# Patient Record
Sex: Female | Born: 1935 | Race: White | Hispanic: Yes | Marital: Married | Smoking: Never smoker
Health system: Southern US, Community
[De-identification: ages and names within clinical notes are randomized; demographics above are authoritative.]

## PROBLEM LIST (undated history)

## (undated) DIAGNOSIS — I10 Essential (primary) hypertension: Secondary | ICD-10-CM

## (undated) DIAGNOSIS — E785 Hyperlipidemia, unspecified: Secondary | ICD-10-CM

## (undated) DIAGNOSIS — K5792 Diverticulitis of intestine, part unspecified, without perforation or abscess without bleeding: Secondary | ICD-10-CM

## (undated) DIAGNOSIS — E119 Type 2 diabetes mellitus without complications: Secondary | ICD-10-CM

## (undated) HISTORY — PX: HERNIA REPAIR: SHX51

## (undated) HISTORY — PX: ABDOMINAL HYSTERECTOMY: SHX81

## (undated) HISTORY — PX: COLON SURGERY: SHX602

## (undated) HISTORY — PX: CHOLECYSTECTOMY: SHX55

---

## 2013-12-10 DIAGNOSIS — K56609 Unspecified intestinal obstruction, unspecified as to partial versus complete obstruction: Secondary | ICD-10-CM | POA: Diagnosis not present

## 2013-12-30 DIAGNOSIS — M7500 Adhesive capsulitis of unspecified shoulder: Secondary | ICD-10-CM | POA: Diagnosis not present

## 2013-12-30 DIAGNOSIS — M7530 Calcific tendinitis of unspecified shoulder: Secondary | ICD-10-CM | POA: Diagnosis not present

## 2013-12-31 DIAGNOSIS — I10 Essential (primary) hypertension: Secondary | ICD-10-CM | POA: Diagnosis not present

## 2013-12-31 DIAGNOSIS — E782 Mixed hyperlipidemia: Secondary | ICD-10-CM | POA: Diagnosis not present

## 2013-12-31 DIAGNOSIS — M25519 Pain in unspecified shoulder: Secondary | ICD-10-CM | POA: Diagnosis not present

## 2013-12-31 DIAGNOSIS — IMO0001 Reserved for inherently not codable concepts without codable children: Secondary | ICD-10-CM | POA: Diagnosis not present

## 2014-01-24 DIAGNOSIS — M25519 Pain in unspecified shoulder: Secondary | ICD-10-CM | POA: Diagnosis not present

## 2014-01-26 DIAGNOSIS — IMO0001 Reserved for inherently not codable concepts without codable children: Secondary | ICD-10-CM | POA: Diagnosis not present

## 2014-01-26 DIAGNOSIS — E782 Mixed hyperlipidemia: Secondary | ICD-10-CM | POA: Diagnosis not present

## 2014-01-26 DIAGNOSIS — M899 Disorder of bone, unspecified: Secondary | ICD-10-CM | POA: Diagnosis not present

## 2014-01-26 DIAGNOSIS — E559 Vitamin D deficiency, unspecified: Secondary | ICD-10-CM | POA: Diagnosis not present

## 2014-01-26 DIAGNOSIS — M2550 Pain in unspecified joint: Secondary | ICD-10-CM | POA: Diagnosis not present

## 2014-01-26 DIAGNOSIS — N183 Chronic kidney disease, stage 3 unspecified: Secondary | ICD-10-CM | POA: Diagnosis not present

## 2014-01-26 DIAGNOSIS — I10 Essential (primary) hypertension: Secondary | ICD-10-CM | POA: Diagnosis not present

## 2014-02-04 DIAGNOSIS — N6019 Diffuse cystic mastopathy of unspecified breast: Secondary | ICD-10-CM | POA: Diagnosis not present

## 2014-02-04 DIAGNOSIS — Z1231 Encounter for screening mammogram for malignant neoplasm of breast: Secondary | ICD-10-CM | POA: Diagnosis not present

## 2014-02-04 DIAGNOSIS — N63 Unspecified lump in unspecified breast: Secondary | ICD-10-CM | POA: Diagnosis not present

## 2014-02-07 DIAGNOSIS — E1169 Type 2 diabetes mellitus with other specified complication: Secondary | ICD-10-CM | POA: Diagnosis not present

## 2014-02-07 DIAGNOSIS — E782 Mixed hyperlipidemia: Secondary | ICD-10-CM | POA: Diagnosis not present

## 2014-02-07 DIAGNOSIS — M19019 Primary osteoarthritis, unspecified shoulder: Secondary | ICD-10-CM | POA: Diagnosis not present

## 2014-02-07 DIAGNOSIS — I10 Essential (primary) hypertension: Secondary | ICD-10-CM | POA: Diagnosis not present

## 2014-02-15 DIAGNOSIS — I259 Chronic ischemic heart disease, unspecified: Secondary | ICD-10-CM | POA: Diagnosis not present

## 2014-02-23 DIAGNOSIS — N6019 Diffuse cystic mastopathy of unspecified breast: Secondary | ICD-10-CM | POA: Diagnosis not present

## 2014-02-28 DIAGNOSIS — E1169 Type 2 diabetes mellitus with other specified complication: Secondary | ICD-10-CM | POA: Diagnosis not present

## 2014-02-28 DIAGNOSIS — S43429A Sprain of unspecified rotator cuff capsule, initial encounter: Secondary | ICD-10-CM | POA: Diagnosis not present

## 2014-02-28 DIAGNOSIS — E782 Mixed hyperlipidemia: Secondary | ICD-10-CM | POA: Diagnosis not present

## 2014-02-28 DIAGNOSIS — I10 Essential (primary) hypertension: Secondary | ICD-10-CM | POA: Diagnosis not present

## 2014-02-28 DIAGNOSIS — M19019 Primary osteoarthritis, unspecified shoulder: Secondary | ICD-10-CM | POA: Diagnosis not present

## 2014-03-24 DIAGNOSIS — I10 Essential (primary) hypertension: Secondary | ICD-10-CM | POA: Diagnosis not present

## 2014-03-24 DIAGNOSIS — M2550 Pain in unspecified joint: Secondary | ICD-10-CM | POA: Diagnosis not present

## 2014-03-24 DIAGNOSIS — E782 Mixed hyperlipidemia: Secondary | ICD-10-CM | POA: Diagnosis not present

## 2014-03-24 DIAGNOSIS — E559 Vitamin D deficiency, unspecified: Secondary | ICD-10-CM | POA: Diagnosis not present

## 2014-03-24 DIAGNOSIS — IMO0001 Reserved for inherently not codable concepts without codable children: Secondary | ICD-10-CM | POA: Diagnosis not present

## 2014-03-24 DIAGNOSIS — N183 Chronic kidney disease, stage 3 unspecified: Secondary | ICD-10-CM | POA: Diagnosis not present

## 2014-03-24 DIAGNOSIS — M899 Disorder of bone, unspecified: Secondary | ICD-10-CM | POA: Diagnosis not present

## 2014-03-27 DIAGNOSIS — Z880 Allergy status to penicillin: Secondary | ICD-10-CM | POA: Diagnosis not present

## 2014-03-27 DIAGNOSIS — I10 Essential (primary) hypertension: Secondary | ICD-10-CM | POA: Diagnosis not present

## 2014-03-27 DIAGNOSIS — R6889 Other general symptoms and signs: Secondary | ICD-10-CM | POA: Diagnosis not present

## 2014-03-27 DIAGNOSIS — I4510 Unspecified right bundle-branch block: Secondary | ICD-10-CM | POA: Diagnosis not present

## 2014-03-27 DIAGNOSIS — E162 Hypoglycemia, unspecified: Secondary | ICD-10-CM | POA: Diagnosis not present

## 2014-03-27 DIAGNOSIS — E119 Type 2 diabetes mellitus without complications: Secondary | ICD-10-CM | POA: Diagnosis not present

## 2014-03-27 DIAGNOSIS — R079 Chest pain, unspecified: Secondary | ICD-10-CM | POA: Diagnosis not present

## 2014-03-27 DIAGNOSIS — R5383 Other fatigue: Secondary | ICD-10-CM | POA: Diagnosis not present

## 2014-03-27 DIAGNOSIS — R5381 Other malaise: Secondary | ICD-10-CM | POA: Diagnosis not present

## 2014-03-27 DIAGNOSIS — J45909 Unspecified asthma, uncomplicated: Secondary | ICD-10-CM | POA: Diagnosis not present

## 2014-03-27 DIAGNOSIS — R030 Elevated blood-pressure reading, without diagnosis of hypertension: Secondary | ICD-10-CM | POA: Diagnosis not present

## 2014-04-01 DIAGNOSIS — K2990 Gastroduodenitis, unspecified, without bleeding: Secondary | ICD-10-CM | POA: Diagnosis not present

## 2014-04-01 DIAGNOSIS — E7800 Pure hypercholesterolemia, unspecified: Secondary | ICD-10-CM | POA: Diagnosis not present

## 2014-04-01 DIAGNOSIS — E119 Type 2 diabetes mellitus without complications: Secondary | ICD-10-CM | POA: Diagnosis not present

## 2014-04-01 DIAGNOSIS — Z880 Allergy status to penicillin: Secondary | ICD-10-CM | POA: Diagnosis not present

## 2014-04-01 DIAGNOSIS — K2970 Gastritis, unspecified, without bleeding: Secondary | ICD-10-CM | POA: Diagnosis not present

## 2014-04-01 DIAGNOSIS — K5289 Other specified noninfective gastroenteritis and colitis: Secondary | ICD-10-CM | POA: Diagnosis not present

## 2014-04-01 DIAGNOSIS — I10 Essential (primary) hypertension: Secondary | ICD-10-CM | POA: Diagnosis not present

## 2014-04-05 DIAGNOSIS — I10 Essential (primary) hypertension: Secondary | ICD-10-CM | POA: Diagnosis not present

## 2014-04-05 DIAGNOSIS — E1169 Type 2 diabetes mellitus with other specified complication: Secondary | ICD-10-CM | POA: Diagnosis not present

## 2014-04-05 DIAGNOSIS — E782 Mixed hyperlipidemia: Secondary | ICD-10-CM | POA: Diagnosis not present

## 2014-04-08 DIAGNOSIS — K319 Disease of stomach and duodenum, unspecified: Secondary | ICD-10-CM | POA: Diagnosis not present

## 2014-04-15 DIAGNOSIS — K3189 Other diseases of stomach and duodenum: Secondary | ICD-10-CM | POA: Diagnosis not present

## 2014-04-15 DIAGNOSIS — K219 Gastro-esophageal reflux disease without esophagitis: Secondary | ICD-10-CM | POA: Diagnosis not present

## 2014-04-27 DIAGNOSIS — M25519 Pain in unspecified shoulder: Secondary | ICD-10-CM | POA: Diagnosis not present

## 2014-04-27 DIAGNOSIS — M25819 Other specified joint disorders, unspecified shoulder: Secondary | ICD-10-CM | POA: Diagnosis not present

## 2014-04-27 DIAGNOSIS — M75120 Complete rotator cuff tear or rupture of unspecified shoulder, not specified as traumatic: Secondary | ICD-10-CM | POA: Diagnosis not present

## 2014-04-29 DIAGNOSIS — H01009 Unspecified blepharitis unspecified eye, unspecified eyelid: Secondary | ICD-10-CM | POA: Diagnosis not present

## 2014-05-04 DIAGNOSIS — M19019 Primary osteoarthritis, unspecified shoulder: Secondary | ICD-10-CM | POA: Diagnosis not present

## 2014-05-04 DIAGNOSIS — I10 Essential (primary) hypertension: Secondary | ICD-10-CM | POA: Diagnosis not present

## 2014-06-02 DIAGNOSIS — R1013 Epigastric pain: Secondary | ICD-10-CM | POA: Diagnosis not present

## 2014-07-26 DIAGNOSIS — M19019 Primary osteoarthritis, unspecified shoulder: Secondary | ICD-10-CM | POA: Diagnosis not present

## 2014-07-26 DIAGNOSIS — I10 Essential (primary) hypertension: Secondary | ICD-10-CM | POA: Diagnosis not present

## 2014-07-26 DIAGNOSIS — R634 Abnormal weight loss: Secondary | ICD-10-CM | POA: Diagnosis not present

## 2014-07-26 DIAGNOSIS — Z6826 Body mass index (BMI) 26.0-26.9, adult: Secondary | ICD-10-CM | POA: Diagnosis not present

## 2014-07-26 DIAGNOSIS — E1169 Type 2 diabetes mellitus with other specified complication: Secondary | ICD-10-CM | POA: Diagnosis not present

## 2014-07-26 DIAGNOSIS — E782 Mixed hyperlipidemia: Secondary | ICD-10-CM | POA: Diagnosis not present

## 2014-07-26 DIAGNOSIS — E559 Vitamin D deficiency, unspecified: Secondary | ICD-10-CM | POA: Diagnosis not present

## 2014-08-11 DIAGNOSIS — E559 Vitamin D deficiency, unspecified: Secondary | ICD-10-CM | POA: Diagnosis not present

## 2014-08-11 DIAGNOSIS — C19 Malignant neoplasm of rectosigmoid junction: Secondary | ICD-10-CM | POA: Diagnosis not present

## 2014-08-11 DIAGNOSIS — E1169 Type 2 diabetes mellitus with other specified complication: Secondary | ICD-10-CM | POA: Diagnosis not present

## 2014-08-11 DIAGNOSIS — I10 Essential (primary) hypertension: Secondary | ICD-10-CM | POA: Diagnosis not present

## 2014-08-11 DIAGNOSIS — E039 Hypothyroidism, unspecified: Secondary | ICD-10-CM | POA: Diagnosis not present

## 2014-08-11 DIAGNOSIS — M19019 Primary osteoarthritis, unspecified shoulder: Secondary | ICD-10-CM | POA: Diagnosis not present

## 2014-08-11 DIAGNOSIS — E782 Mixed hyperlipidemia: Secondary | ICD-10-CM | POA: Diagnosis not present

## 2014-08-11 DIAGNOSIS — R634 Abnormal weight loss: Secondary | ICD-10-CM | POA: Diagnosis not present

## 2014-08-18 DIAGNOSIS — H04129 Dry eye syndrome of unspecified lacrimal gland: Secondary | ICD-10-CM | POA: Diagnosis not present

## 2014-08-25 DIAGNOSIS — R1013 Epigastric pain: Secondary | ICD-10-CM | POA: Diagnosis not present

## 2014-08-25 DIAGNOSIS — K3189 Other diseases of stomach and duodenum: Secondary | ICD-10-CM | POA: Diagnosis not present

## 2014-08-25 DIAGNOSIS — R141 Gas pain: Secondary | ICD-10-CM | POA: Diagnosis not present

## 2014-08-25 DIAGNOSIS — K219 Gastro-esophageal reflux disease without esophagitis: Secondary | ICD-10-CM | POA: Diagnosis not present

## 2014-09-07 DIAGNOSIS — M25512 Pain in left shoulder: Secondary | ICD-10-CM | POA: Diagnosis not present

## 2014-09-07 DIAGNOSIS — E782 Mixed hyperlipidemia: Secondary | ICD-10-CM | POA: Diagnosis not present

## 2014-09-07 DIAGNOSIS — E119 Type 2 diabetes mellitus without complications: Secondary | ICD-10-CM | POA: Diagnosis not present

## 2014-09-07 DIAGNOSIS — Z6826 Body mass index (BMI) 26.0-26.9, adult: Secondary | ICD-10-CM | POA: Diagnosis not present

## 2014-09-08 DIAGNOSIS — E1142 Type 2 diabetes mellitus with diabetic polyneuropathy: Secondary | ICD-10-CM | POA: Diagnosis not present

## 2014-09-08 DIAGNOSIS — L600 Ingrowing nail: Secondary | ICD-10-CM | POA: Diagnosis not present

## 2014-09-08 DIAGNOSIS — M79675 Pain in left toe(s): Secondary | ICD-10-CM | POA: Diagnosis not present

## 2014-09-14 DIAGNOSIS — N6019 Diffuse cystic mastopathy of unspecified breast: Secondary | ICD-10-CM | POA: Diagnosis not present

## 2014-12-29 DIAGNOSIS — E119 Type 2 diabetes mellitus without complications: Secondary | ICD-10-CM | POA: Diagnosis not present

## 2014-12-29 DIAGNOSIS — D649 Anemia, unspecified: Secondary | ICD-10-CM | POA: Diagnosis not present

## 2014-12-29 DIAGNOSIS — N390 Urinary tract infection, site not specified: Secondary | ICD-10-CM | POA: Diagnosis not present

## 2014-12-29 DIAGNOSIS — E559 Vitamin D deficiency, unspecified: Secondary | ICD-10-CM | POA: Diagnosis not present

## 2014-12-29 DIAGNOSIS — E780 Pure hypercholesterolemia: Secondary | ICD-10-CM | POA: Diagnosis not present

## 2015-02-10 DIAGNOSIS — J069 Acute upper respiratory infection, unspecified: Secondary | ICD-10-CM | POA: Diagnosis not present

## 2015-02-14 DIAGNOSIS — K219 Gastro-esophageal reflux disease without esophagitis: Secondary | ICD-10-CM | POA: Diagnosis not present

## 2015-02-14 DIAGNOSIS — I10 Essential (primary) hypertension: Secondary | ICD-10-CM | POA: Diagnosis not present

## 2015-02-14 DIAGNOSIS — Z6827 Body mass index (BMI) 27.0-27.9, adult: Secondary | ICD-10-CM | POA: Diagnosis not present

## 2015-02-14 DIAGNOSIS — N6019 Diffuse cystic mastopathy of unspecified breast: Secondary | ICD-10-CM | POA: Diagnosis not present

## 2015-02-14 DIAGNOSIS — G4700 Insomnia, unspecified: Secondary | ICD-10-CM | POA: Diagnosis not present

## 2015-02-14 DIAGNOSIS — E782 Mixed hyperlipidemia: Secondary | ICD-10-CM | POA: Diagnosis not present

## 2015-02-14 DIAGNOSIS — E119 Type 2 diabetes mellitus without complications: Secondary | ICD-10-CM | POA: Diagnosis not present

## 2015-03-01 DIAGNOSIS — N6019 Diffuse cystic mastopathy of unspecified breast: Secondary | ICD-10-CM | POA: Diagnosis not present

## 2015-03-02 DIAGNOSIS — Z1212 Encounter for screening for malignant neoplasm of rectum: Secondary | ICD-10-CM | POA: Diagnosis not present

## 2015-03-02 DIAGNOSIS — E119 Type 2 diabetes mellitus without complications: Secondary | ICD-10-CM | POA: Diagnosis not present

## 2015-03-07 DIAGNOSIS — Z86010 Personal history of colonic polyps: Secondary | ICD-10-CM | POA: Diagnosis not present

## 2015-03-09 DIAGNOSIS — D124 Benign neoplasm of descending colon: Secondary | ICD-10-CM | POA: Diagnosis not present

## 2015-03-22 DIAGNOSIS — N6019 Diffuse cystic mastopathy of unspecified breast: Secondary | ICD-10-CM | POA: Diagnosis not present

## 2015-04-05 DIAGNOSIS — I10 Essential (primary) hypertension: Secondary | ICD-10-CM | POA: Diagnosis not present

## 2015-04-10 DIAGNOSIS — G4700 Insomnia, unspecified: Secondary | ICD-10-CM | POA: Diagnosis not present

## 2015-04-10 DIAGNOSIS — I10 Essential (primary) hypertension: Secondary | ICD-10-CM | POA: Diagnosis not present

## 2015-04-10 DIAGNOSIS — E663 Overweight: Secondary | ICD-10-CM | POA: Diagnosis not present

## 2015-04-10 DIAGNOSIS — Z7902 Long term (current) use of antithrombotics/antiplatelets: Secondary | ICD-10-CM | POA: Diagnosis not present

## 2015-04-10 DIAGNOSIS — E119 Type 2 diabetes mellitus without complications: Secondary | ICD-10-CM | POA: Diagnosis not present

## 2015-04-10 DIAGNOSIS — Z6827 Body mass index (BMI) 27.0-27.9, adult: Secondary | ICD-10-CM | POA: Diagnosis not present

## 2015-04-10 DIAGNOSIS — E782 Mixed hyperlipidemia: Secondary | ICD-10-CM | POA: Diagnosis not present

## 2015-05-18 DIAGNOSIS — E780 Pure hypercholesterolemia: Secondary | ICD-10-CM | POA: Diagnosis not present

## 2015-05-18 DIAGNOSIS — E039 Hypothyroidism, unspecified: Secondary | ICD-10-CM | POA: Diagnosis not present

## 2015-05-18 DIAGNOSIS — D649 Anemia, unspecified: Secondary | ICD-10-CM | POA: Diagnosis not present

## 2015-05-18 DIAGNOSIS — E559 Vitamin D deficiency, unspecified: Secondary | ICD-10-CM | POA: Diagnosis not present

## 2015-05-18 DIAGNOSIS — E119 Type 2 diabetes mellitus without complications: Secondary | ICD-10-CM | POA: Diagnosis not present

## 2015-05-18 DIAGNOSIS — N390 Urinary tract infection, site not specified: Secondary | ICD-10-CM | POA: Diagnosis not present

## 2015-09-18 ENCOUNTER — Emergency Department (HOSPITAL_COMMUNITY)
Admission: EM | Admit: 2015-09-18 | Discharge: 2015-09-18 | Disposition: A | Discharge status: Home / Self Care | Payer: Medicare Other | Attending: Physician Assistant | Admitting: Physician Assistant

## 2015-09-18 ENCOUNTER — Emergency Department (HOSPITAL_COMMUNITY): Payer: Medicare Other

## 2015-09-18 ENCOUNTER — Encounter (HOSPITAL_COMMUNITY): Payer: Self-pay | Admitting: *Deleted

## 2015-09-18 DIAGNOSIS — K529 Noninfective gastroenteritis and colitis, unspecified: Secondary | ICD-10-CM

## 2015-09-18 DIAGNOSIS — I10 Essential (primary) hypertension: Secondary | ICD-10-CM | POA: Diagnosis not present

## 2015-09-18 DIAGNOSIS — R1084 Generalized abdominal pain: Secondary | ICD-10-CM | POA: Diagnosis present

## 2015-09-18 DIAGNOSIS — E119 Type 2 diabetes mellitus without complications: Secondary | ICD-10-CM | POA: Diagnosis not present

## 2015-09-18 DIAGNOSIS — Z79899 Other long term (current) drug therapy: Secondary | ICD-10-CM | POA: Insufficient documentation

## 2015-09-18 DIAGNOSIS — R109 Unspecified abdominal pain: Secondary | ICD-10-CM | POA: Diagnosis not present

## 2015-09-18 DIAGNOSIS — Z9049 Acquired absence of other specified parts of digestive tract: Secondary | ICD-10-CM | POA: Diagnosis not present

## 2015-09-18 DIAGNOSIS — Z90710 Acquired absence of both cervix and uterus: Secondary | ICD-10-CM | POA: Insufficient documentation

## 2015-09-18 DIAGNOSIS — Z880 Allergy status to penicillin: Secondary | ICD-10-CM | POA: Diagnosis not present

## 2015-09-18 DIAGNOSIS — R197 Diarrhea, unspecified: Secondary | ICD-10-CM | POA: Diagnosis not present

## 2015-09-18 HISTORY — DX: Type 2 diabetes mellitus without complications: E11.9

## 2015-09-18 HISTORY — DX: Essential (primary) hypertension: I10

## 2015-09-18 LAB — CBC
HEMATOCRIT: 38.3 % (ref 36.0–46.0)
Hemoglobin: 13.1 g/dL (ref 12.0–15.0)
MCH: 31.3 pg (ref 26.0–34.0)
MCHC: 34.2 g/dL (ref 30.0–36.0)
MCV: 91.4 fL (ref 78.0–100.0)
Platelets: 175 10*3/uL (ref 150–400)
RBC: 4.19 MIL/uL (ref 3.87–5.11)
RDW: 13.1 % (ref 11.5–15.5)
WBC: 8.8 10*3/uL (ref 4.0–10.5)

## 2015-09-18 LAB — COMPREHENSIVE METABOLIC PANEL
ALT: 16 U/L (ref 14–54)
ANION GAP: 9 (ref 5–15)
AST: 26 U/L (ref 15–41)
Albumin: 4.3 g/dL (ref 3.5–5.0)
Alkaline Phosphatase: 73 U/L (ref 38–126)
BILIRUBIN TOTAL: 0.8 mg/dL (ref 0.3–1.2)
BUN: 17 mg/dL (ref 6–20)
CHLORIDE: 106 mmol/L (ref 101–111)
CO2: 27 mmol/L (ref 22–32)
Calcium: 9.4 mg/dL (ref 8.9–10.3)
Creatinine, Ser: 0.76 mg/dL (ref 0.44–1.00)
Glucose, Bld: 116 mg/dL — ABNORMAL HIGH (ref 65–99)
POTASSIUM: 3.5 mmol/L (ref 3.5–5.1)
Sodium: 142 mmol/L (ref 135–145)
TOTAL PROTEIN: 7.8 g/dL (ref 6.5–8.1)

## 2015-09-18 LAB — URINALYSIS, ROUTINE W REFLEX MICROSCOPIC
BILIRUBIN URINE: NEGATIVE
Glucose, UA: NEGATIVE mg/dL
KETONES UR: NEGATIVE mg/dL
LEUKOCYTES UA: NEGATIVE
NITRITE: NEGATIVE
PH: 5 (ref 5.0–8.0)
Protein, ur: NEGATIVE mg/dL
SPECIFIC GRAVITY, URINE: 1.01 (ref 1.005–1.030)
UROBILINOGEN UA: 0.2 mg/dL (ref 0.0–1.0)

## 2015-09-18 LAB — URINE MICROSCOPIC-ADD ON

## 2015-09-18 LAB — LIPASE, BLOOD: LIPASE: 23 U/L (ref 22–51)

## 2015-09-18 MED ORDER — METRONIDAZOLE 500 MG PO TABS: 500.0000 mg | ORAL_TABLET | Freq: Two times a day (BID) | ORAL | Status: DC

## 2015-09-18 MED ORDER — IOHEXOL 300 MG/ML  SOLN
25.0000 mL | Freq: Once | INTRAMUSCULAR | Status: DC | PRN
  Administered 2015-09-18: 25 mL via ORAL
  Filled 2015-09-18: qty 30

## 2015-09-18 MED ORDER — CIPROFLOXACIN HCL 500 MG PO TABS: 500.0000 mg | ORAL_TABLET | Freq: Two times a day (BID) | ORAL | Status: DC

## 2015-09-18 MED ORDER — IOHEXOL 300 MG/ML  SOLN
100.0000 mL | Freq: Once | INTRAMUSCULAR | Status: AC | PRN
  Administered 2015-09-18: 100 mL via INTRAVENOUS

## 2015-09-18 NOTE — Progress Notes (Signed)
Pt without medicare doctor Female visitor who is able to speak english confirms they recently moved to [facility]  Pt provided with a 7 page list of medicare providers within zip code [phone] and resources for seniors, medications DSS, health dept and SSI office in [facility]

## 2015-09-18 NOTE — Discharge Instructions (Signed)
Colitis (Colitis) La colitis es la inflamacin del colon y puede durar un breve perodo [place]) o prolongarse mucho tiempo (crnica). CAUSAS Esta afeccin puede ser causada por lo siguiente:  Virus.  Bacterias.  Reacciones a medicamentos.  Algunas enfermedades autoinmunitarias, como la enfermedad de Crohn y la colitis ulcerosa. SNTOMAS Los sntomas de esta afeccin incluyen lo siguiente:  Diarrea.  Materia fecal sanguinolenta o alquitranada.  Dolor.  Cristy Hilts.  Vmitos.  Cansancio (fatiga).  Prdida de peso.  Meteorismo.  Aumento repentino del dolor abdominal.  Menos deposiciones que lo habitual. DIAGNSTICO Esta afeccin se diagnostica mediante un anlisis de materia fecal o de sangre. Tambin pueden [employer], como radiografas, una tomografa computarizada (TC) o una colonoscopia. TRATAMIENTO El tratamiento puede incluir lo siguiente:  Orthoptist a los intestinos, es Software engineer, no consumir alimentos ni lquidos durante un tiempo.  Administracin de lquidos por va intravenosa (IV).  Medicamentos para Conservation officer, historic buildings y Building services engineer.  Antibiticos.  Medicamentos con cortisona.  Ciruga. INSTRUCCIONES PARA EL CUIDADO EN EL HOGAR Comida y bebida  Siga las indicaciones del mdico respecto de las restricciones para las comidas o las bebidas.  Beba suficiente lquido para Consulting civil engineer orina clara o de color amarillo plido.  Trabaje con un nutricionista para determinar cules son los alimentos que Programmer, applications.  Evite los alimentos que reagudizan la afeccin.  Consumir una Pharmacologist. Medicamentos  [employer] de venta libre y los recetados solamente como se lo haya indicado el mdico.  Si le recetaron un antibitico, tmelo como se lo haya indicado el mdico. No deje de tomar los antibiticos aunque comience a sentirse mejor. Instrucciones generales  Concurra a todas las visitas de control como se lo haya indicado  el mdico. Esto es importante. SOLICITE ATENCIN MDICA SI:  Los sntomas no desaparecen.  Presenta nuevos sntomas. SOLICITE ATENCIN MDICA DE INMEDIATO SI:  Tiene fiebre que no desaparece con tratamiento.  Comienza a sentir escalofros.  Se siente muy dbil, se desmaya o se deshidrata.  Ha vomitado repetidas veces.  Siente dolor intenso en el abdomen.  Su materia fecal es sanguinolenta o alquitranada.   Esta informacin no tiene Marine scientist el consejo del mdico. Asegrese de hacerle al mdico cualquier pregunta que tenga.   Document Released: 11/18/2005 Document Revised: 08/09/2015 Elsevier Interactive Patient Education [employer].

## 2015-09-18 NOTE — ED Provider Notes (Signed)
CSN: [id number]     Arrival date & time 09/18/15  1055 History   First MD Initiated Contact with Patient 09/18/15 1131     Chief Complaint  Patient presents with  . Abdominal Pain  . Diarrhea     (Consider location/radiation/quality/duration/timing/severity/associated sxs/prior Treatment) HPI Comments: Pt comes in with c/o generalized abdominal and diarrhea times 3 days. Pt states that she has had nausea without vomiting, no fever or dysuria. Pt states that nothing makes the pain better or worse. She has had gallbladder removed, hysterectomy. Has a history of hernia.  The history is provided by the patient. No language interpreter was used.    Past Medical History  Diagnosis Date  . Diabetes mellitus without complication ([facility])   . Hypertension    No past surgical history on file. No family history on file. Social History  Substance Use Topics  . Smoking status: Never Smoker   . Smokeless tobacco: Not on file  . Alcohol Use: No   OB History    No data available     Review of Systems  All other systems reviewed and are negative.     Allergies  Penicillins  Home Medications   Prior to Admission medications   Medication Sig Start Date End Date Taking? Authorizing Provider  atorvastatin (LIPITOR) 40 MG tablet Take 40 mg by mouth daily.   Yes Historical Provider, MD  carvedilol (COREG) 12.5 MG tablet Take 12.5 mg by mouth 2 (two) times daily with a meal.   Yes Historical Provider, MD  glimepiride (AMARYL) 4 MG tablet Take 4 mg by mouth 2 (two) times daily.   Yes Historical Provider, MD  losartan-hydrochlorothiazide (HYZAAR) 100-12.5 MG tablet Take 1 tablet by mouth daily.   Yes Historical Provider, MD  Multiple Vitamins-Minerals (CENTRUM ADULTS) TABS Take 1 tablet by mouth daily.   Yes Historical Provider, MD   BP 159/74 mmHg  Pulse 63  Temp(Src) 98.1 F (36.7 C) (Oral)  Resp 16  SpO2 97% Physical Exam  Constitutional: She is oriented to person, place, and time.  She appears well-developed and well-nourished.  Cardiovascular: Normal rate and regular rhythm.   Pulmonary/Chest: Effort normal and breath sounds normal.  Abdominal: Soft. Bowel sounds are increased. There is tenderness in the periumbilical area.  Musculoskeletal: Normal range of motion.  Neurological: She is alert and oriented to person, place, and time.  Skin: Skin is warm.  Psychiatric: She has a normal mood and affect.  Nursing note and vitals reviewed.   ED Course  Procedures (including critical care time) Labs Review Labs Reviewed  COMPREHENSIVE METABOLIC PANEL - Abnormal; Notable for the following:    Glucose, Bld 116 (*)    All other components within normal limits  URINALYSIS, ROUTINE W REFLEX MICROSCOPIC (NOT AT [facility]) - Abnormal; Notable for the following:    Hgb urine dipstick SMALL (*)    All other components within normal limits  LIPASE, BLOOD  CBC  URINE MICROSCOPIC-ADD ON    Imaging Review Ct Abdomen Pelvis W Contrast  09/18/2015  CLINICAL DATA:  Diffuse abdominal pain and diarrhea for 3 days. EXAM: CT ABDOMEN AND PELVIS WITH CONTRAST TECHNIQUE: Multidetector CT imaging of the abdomen and pelvis was performed using the standard protocol following bolus administration of intravenous contrast. CONTRAST:  169mL OMNIPAQUE IOHEXOL 300 MG/ML  SOLN COMPARISON:  None. FINDINGS: Lower chest:  No acute findings. Hepatobiliary: Probable tiny sub-cm right hepatic lobe cyst noted. No liver masses are identified. Prior cholecystectomy noted. Mild biliary ductal dilatation  is likely related to prior cholecystectomy. Pancreas: No mass, inflammatory changes, or other significant abnormality. Spleen: Within normal limits in size and appearance. Adrenals/Urinary Tract: No masses identified. No evidence of hydronephrosis. Stomach/Bowel: No evidence of bowel obstruction. Mild colonic wall thickening is seen involving the cecum, ascending colon, and hepatic flexure, consistent with colitis. No  evidence of abscess or free fluid. Mild left colonic diverticulosis is seen without evidence of diverticulitis. Vascular/Lymphatic: No pathologically enlarged lymph nodes. No evidence of abdominal aortic aneurysm. Aortic atherosclerotic plaque noted. Reproductive: Prior hysterectomy noted. Adnexal regions are unremarkable in appearance. Other: A small paraumbilical ventral hernia is seen containing a loop of small bowel. No evidence of small bowel wall thickening or dilatation. Several left upper quadrant surgical clips noted. Musculoskeletal:  No suspicious bone lesions identified. IMPRESSION: Mild wall thickening involving the ascending colon and hepatic flexure, consistent with colitis. No evidence of abscess or other complication. Mild biliary ductal dilatation. No etiology visualized by CT, and this may be related to prior cholecystectomy. Suggest correlation with liver function tests ; consider MRCP for further evaluation if clinically warranted. Small paraumbilical ventral hernia containing a loop of small bowel. No evidence of small bowel obstruction or ischemia. Electronically Signed   By: Earle Gell M.D.   On: 09/18/2015 14:31   I have personally reviewed and evaluated these images and lab results as part of my medical decision-making.   EKG Interpretation None      MDM   Final diagnoses:  Colitis    Will treat with cipro flagyl. Pt has had a cholecystectomy and labs are fine.    Glendell Docker, NP 09/18/15 [place], MD 09/20/15 1052

## 2015-09-18 NOTE — ED Notes (Signed)
Pt speaks limited [place], family translating [place]. Pt reports abd pain and diarrhea x3 days. Pain 8/10. Denies vomiting.

## 2015-09-18 NOTE — ED Notes (Signed)
Patient transported to CT 

## 2015-09-24 ENCOUNTER — Encounter (HOSPITAL_COMMUNITY): Payer: Self-pay | Admitting: Oncology

## 2015-09-24 ENCOUNTER — Other Ambulatory Visit: Payer: Self-pay

## 2015-09-24 ENCOUNTER — Observation Stay (HOSPITAL_COMMUNITY)
Admission: EM | Admit: 2015-09-24 | Discharge: 2015-09-25 | Disposition: A | Discharge status: Home / Self Care | Payer: Medicare Other | Attending: Internal Medicine | Admitting: Internal Medicine

## 2015-09-24 ENCOUNTER — Observation Stay (HOSPITAL_COMMUNITY): Payer: Medicare Other

## 2015-09-24 ENCOUNTER — Other Ambulatory Visit (HOSPITAL_COMMUNITY): Payer: Medicare Other

## 2015-09-24 ENCOUNTER — Emergency Department (HOSPITAL_COMMUNITY): Payer: Medicare Other

## 2015-09-24 DIAGNOSIS — M79601 Pain in right arm: Secondary | ICD-10-CM | POA: Insufficient documentation

## 2015-09-24 DIAGNOSIS — Z79899 Other long term (current) drug therapy: Secondary | ICD-10-CM | POA: Insufficient documentation

## 2015-09-24 DIAGNOSIS — R7989 Other specified abnormal findings of blood chemistry: Secondary | ICD-10-CM | POA: Diagnosis not present

## 2015-09-24 DIAGNOSIS — Z7984 Long term (current) use of oral hypoglycemic drugs: Secondary | ICD-10-CM | POA: Insufficient documentation

## 2015-09-24 DIAGNOSIS — K529 Noninfective gastroenteritis and colitis, unspecified: Secondary | ICD-10-CM | POA: Diagnosis not present

## 2015-09-24 DIAGNOSIS — R748 Abnormal levels of other serum enzymes: Secondary | ICD-10-CM | POA: Insufficient documentation

## 2015-09-24 DIAGNOSIS — D72829 Elevated white blood cell count, unspecified: Secondary | ICD-10-CM | POA: Diagnosis not present

## 2015-09-24 DIAGNOSIS — R0789 Other chest pain: Secondary | ICD-10-CM

## 2015-09-24 DIAGNOSIS — I10 Essential (primary) hypertension: Secondary | ICD-10-CM | POA: Diagnosis not present

## 2015-09-24 DIAGNOSIS — Z6825 Body mass index (BMI) 25.0-25.9, adult: Secondary | ICD-10-CM | POA: Diagnosis not present

## 2015-09-24 DIAGNOSIS — E669 Obesity, unspecified: Secondary | ICD-10-CM | POA: Insufficient documentation

## 2015-09-24 DIAGNOSIS — E785 Hyperlipidemia, unspecified: Secondary | ICD-10-CM

## 2015-09-24 DIAGNOSIS — R079 Chest pain, unspecified: Secondary | ICD-10-CM | POA: Diagnosis present

## 2015-09-24 DIAGNOSIS — M542 Cervicalgia: Principal | ICD-10-CM | POA: Insufficient documentation

## 2015-09-24 DIAGNOSIS — R778 Other specified abnormalities of plasma proteins: Secondary | ICD-10-CM

## 2015-09-24 DIAGNOSIS — E119 Type 2 diabetes mellitus without complications: Secondary | ICD-10-CM | POA: Diagnosis not present

## 2015-09-24 DIAGNOSIS — M47812 Spondylosis without myelopathy or radiculopathy, cervical region: Secondary | ICD-10-CM | POA: Diagnosis not present

## 2015-09-24 HISTORY — DX: Hyperlipidemia, unspecified: E78.5

## 2015-09-24 HISTORY — DX: Type 2 diabetes mellitus without complications: E11.9

## 2015-09-24 HISTORY — DX: Essential (primary) hypertension: I10

## 2015-09-24 HISTORY — DX: Diverticulitis of intestine, part unspecified, without perforation or abscess without bleeding: K57.92

## 2015-09-24 LAB — URINALYSIS, ROUTINE W REFLEX MICROSCOPIC
BILIRUBIN URINE: NEGATIVE
GLUCOSE, UA: NEGATIVE mg/dL
KETONES UR: NEGATIVE mg/dL
LEUKOCYTES UA: NEGATIVE
Nitrite: NEGATIVE
PH: 5 (ref 5.0–8.0)
Protein, ur: NEGATIVE mg/dL
SPECIFIC GRAVITY, URINE: 1.012 (ref 1.005–1.030)
Urobilinogen, UA: 0.2 mg/dL (ref 0.0–1.0)

## 2015-09-24 LAB — CBC
HEMATOCRIT: 38.8 % (ref 36.0–46.0)
HEMOGLOBIN: 13 g/dL (ref 12.0–15.0)
MCH: 31 pg (ref 26.0–34.0)
MCHC: 33.5 g/dL (ref 30.0–36.0)
MCV: 92.4 fL (ref 78.0–100.0)
Platelets: 149 10*3/uL — ABNORMAL LOW (ref 150–400)
RBC: 4.2 MIL/uL (ref 3.87–5.11)
RDW: 13.4 % (ref 11.5–15.5)
WBC: 13.6 10*3/uL — ABNORMAL HIGH (ref 4.0–10.5)

## 2015-09-24 LAB — BASIC METABOLIC PANEL
ANION GAP: 13 (ref 5–15)
BUN: 23 mg/dL — AB (ref 6–20)
CO2: 21 mmol/L — AB (ref 22–32)
Calcium: 9.1 mg/dL (ref 8.9–10.3)
Chloride: 101 mmol/L (ref 101–111)
Creatinine, Ser: 1.04 mg/dL — ABNORMAL HIGH (ref 0.44–1.00)
GFR calc Af Amer: 58 mL/min — ABNORMAL LOW (ref >60)
GFR, EST NON AFRICAN AMERICAN: 50 mL/min — AB (ref >60)
GLUCOSE: 112 mg/dL — AB (ref 65–99)
POTASSIUM: 4 mmol/L (ref 3.5–5.1)
Sodium: 135 mmol/L (ref 135–145)

## 2015-09-24 LAB — URINE MICROSCOPIC-ADD ON

## 2015-09-24 LAB — GLUCOSE, CAPILLARY
GLUCOSE-CAPILLARY: 113 mg/dL — AB (ref 65–99)
GLUCOSE-CAPILLARY: 121 mg/dL — AB (ref 65–99)
Glucose-Capillary: 173 mg/dL — ABNORMAL HIGH (ref 65–99)
Glucose-Capillary: 65 mg/dL (ref 65–99)

## 2015-09-24 LAB — D-DIMER, QUANTITATIVE: D-Dimer, Quant: 0.52 ug/mL-FEU — ABNORMAL HIGH (ref 0.00–0.48)

## 2015-09-24 LAB — MAGNESIUM: MAGNESIUM: 2.1 mg/dL (ref 1.7–2.4)

## 2015-09-24 LAB — TROPONIN I
TROPONIN I: 0.07 ng/mL — AB (ref <0.031)
Troponin I: <0.03 ng/mL (ref <0.031)
Troponin I: <0.03 ng/mL (ref <0.031)

## 2015-09-24 LAB — TSH: TSH: 1.139 u[IU]/mL (ref 0.350–4.500)

## 2015-09-24 MED ORDER — SODIUM CHLORIDE 0.9 % IJ SOLN
3.0000 mL | Freq: Two times a day (BID) | INTRAMUSCULAR | Status: DC
Start: 2015-09-24 — End: 2015-09-25
  Administered 2015-09-25: 3 mL via INTRAVENOUS

## 2015-09-24 MED ORDER — CIPROFLOXACIN HCL 500 MG PO TABS
500.0000 mg | ORAL_TABLET | Freq: Two times a day (BID) | ORAL | Status: DC
  Administered 2015-09-24 – 2015-09-25 (×3): 500 mg via ORAL
  Filled 2015-09-24 (×3): qty 1

## 2015-09-24 MED ORDER — SENNOSIDES-DOCUSATE SODIUM 8.6-50 MG PO TABS: 1.0000 | ORAL_TABLET | Freq: Every evening | ORAL | Status: DC | PRN

## 2015-09-24 MED ORDER — ASPIRIN 81 MG PO CHEW
324.0000 mg | CHEWABLE_TABLET | Freq: Once | ORAL | Status: AC
  Administered 2015-09-24: 324 mg via ORAL
  Filled 2015-09-24: qty 4

## 2015-09-24 MED ORDER — METRONIDAZOLE 500 MG PO TABS
500.0000 mg | ORAL_TABLET | Freq: Two times a day (BID) | ORAL | Status: DC
  Administered 2015-09-24 – 2015-09-25 (×3): 500 mg via ORAL
  Filled 2015-09-24 (×3): qty 1

## 2015-09-24 MED ORDER — REGADENOSON 0.4 MG/5ML IV SOLN
0.4000 mg | Freq: Once | INTRAVENOUS | Status: DC
  Filled 2015-09-24 (×2): qty 5

## 2015-09-24 MED ORDER — PANTOPRAZOLE SODIUM 40 MG PO TBEC
40.0000 mg | DELAYED_RELEASE_TABLET | Freq: Every day | ORAL | Status: DC
  Administered 2015-09-24 – 2015-09-25 (×2): 40 mg via ORAL
  Filled 2015-09-24 (×2): qty 1

## 2015-09-24 MED ORDER — ACETAMINOPHEN 650 MG RE SUPP: 650.0000 mg | Freq: Four times a day (QID) | RECTAL | Status: DC | PRN

## 2015-09-24 MED ORDER — ENOXAPARIN SODIUM 40 MG/0.4ML ~~LOC~~ SOLN
40.0000 mg | SUBCUTANEOUS | Status: DC
  Administered 2015-09-24 – 2015-09-25 (×2): 40 mg via SUBCUTANEOUS
  Filled 2015-09-24 (×2): qty 0.4

## 2015-09-24 MED ORDER — INSULIN ASPART 100 UNIT/ML ~~LOC~~ SOLN
0.0000 [IU] | Freq: Three times a day (TID) | SUBCUTANEOUS | Status: DC
  Administered 2015-09-24: 3 [IU] via SUBCUTANEOUS

## 2015-09-24 MED ORDER — CENTRUM ADULTS PO TABS: 1.0000 | ORAL_TABLET | Freq: Every day | ORAL | Status: DC

## 2015-09-24 MED ORDER — OXYCODONE HCL 5 MG PO TABS
5.0000 mg | ORAL_TABLET | ORAL | Status: DC | PRN
  Administered 2015-09-24: 5 mg via ORAL
  Filled 2015-09-24: qty 1

## 2015-09-24 MED ORDER — ALUM & MAG HYDROXIDE-SIMETH 200-200-20 MG/5ML PO SUSP: 30.0000 mL | Freq: Four times a day (QID) | ORAL | Status: DC | PRN

## 2015-09-24 MED ORDER — ADULT MULTIVITAMIN W/MINERALS CH
1.0000 | ORAL_TABLET | Freq: Every day | ORAL | Status: DC
  Administered 2015-09-24 – 2015-09-25 (×2): 1 via ORAL
  Filled 2015-09-24 (×2): qty 1

## 2015-09-24 MED ORDER — ONDANSETRON HCL 4 MG PO TABS: 4.0000 mg | ORAL_TABLET | Freq: Four times a day (QID) | ORAL | Status: DC | PRN

## 2015-09-24 MED ORDER — SODIUM CHLORIDE 0.9 % IV SOLN
INTRAVENOUS | Status: DC
  Administered 2015-09-24 (×2): via INTRAVENOUS

## 2015-09-24 MED ORDER — CARVEDILOL 12.5 MG PO TABS
12.5000 mg | ORAL_TABLET | Freq: Two times a day (BID) | ORAL | Status: DC
  Administered 2015-09-24 (×2): 12.5 mg via ORAL
  Filled 2015-09-24 (×3): qty 1

## 2015-09-24 MED ORDER — HYDROCODONE-ACETAMINOPHEN 5-325 MG PO TABS
1.0000 | ORAL_TABLET | Freq: Once | ORAL | Status: AC
  Administered 2015-09-24: 1 via ORAL
  Filled 2015-09-24: qty 1

## 2015-09-24 MED ORDER — ACETAMINOPHEN 325 MG PO TABS: 650.0000 mg | ORAL_TABLET | Freq: Four times a day (QID) | ORAL | Status: DC | PRN

## 2015-09-24 MED ORDER — ONDANSETRON HCL 4 MG/2ML IJ SOLN: 4.0000 mg | Freq: Four times a day (QID) | INTRAMUSCULAR | Status: DC | PRN

## 2015-09-24 MED ORDER — ATORVASTATIN CALCIUM 40 MG PO TABS
40.0000 mg | ORAL_TABLET | Freq: Every day | ORAL | Status: DC
  Administered 2015-09-24: 40 mg via ORAL
  Filled 2015-09-24 (×2): qty 1

## 2015-09-24 MED ORDER — IBUPROFEN 400 MG PO TABS
400.0000 mg | ORAL_TABLET | Freq: Three times a day (TID) | ORAL | Status: DC
  Administered 2015-09-24 – 2015-09-25 (×4): 400 mg via ORAL
  Filled 2015-09-24 (×5): qty 1

## 2015-09-24 MED ORDER — ASPIRIN EC 81 MG PO TBEC
81.0000 mg | DELAYED_RELEASE_TABLET | Freq: Every day | ORAL | Status: DC
Start: 2015-09-24 — End: 2015-09-25
  Administered 2015-09-24 – 2015-09-25 (×2): 81 mg via ORAL
  Filled 2015-09-24 (×2): qty 1

## 2015-09-24 MED ORDER — SORBITOL 70 % SOLN
30.0000 mL | Freq: Every day | Status: DC | PRN
  Filled 2015-09-24: qty 30

## 2015-09-24 NOTE — ED Notes (Signed)
Nurse is starting iv will collect labs

## 2015-09-24 NOTE — Progress Notes (Signed)
Called [facility], spoke with Caroline Cooper, patient scheduled for procedure at 0830 on 09/25/15, pt and her family aware

## 2015-09-24 NOTE — ED Notes (Signed)
Pt presents d/t c/o central chest pain w/ radiation to right arm x 3 days.  Pt describes pain as spasms.

## 2015-09-24 NOTE — H&P (Signed)
[facility] History and Physical  Caroline Cooper CVE:[id number] DOB: 1935/01/09 DOA: 09/24/2015  Referring physician: Dr. Dina Rich PCP: No primary care provider on file.   Chief Complaint: Right neck and right upper extremity pain  HPI: Caroline Cooper is a 80 y.o. female  From [place] who recently moved to the area in June 2016 with a history of diabetes mellitus, hypertension, hyperlipidemia presenting to the ED with a three-day history of worsening constant right upper extremity right shoulder and right neck pain which she states feels like a headache. Patient states pain worsens with movement however denies any worsening with exertion. Patient also states when she tries to get something with her right arm she has pain. Patient denies any recent trauma. No recent falls. Patient denies any numbness or tingling down the right upper extremity. Patient denies any shortness of breath, no chest pain, no palpitations, no fever, no chills, no nausea, no vomiting, no abdominal pain, no diarrhea, no constipation, no dysuria, no weakness, no melena, no hematemesis, no hematochezia. Patient denies any family history of coronary artery disease. Patient denies any recent surgeries. No recent long car rides. Patient was recently seen in the ED on 09/18/2015 and was diagnosed with a colitis and being treated with Cipro and Flagyl with clinical improvement. Patient was seen in the ED chest x-ray which was done was negative for any acute cardiopulmonary disease. Basic metabolic profile done had a BUN of 23 creatinine of 1.04 otherwise was within normal limits. Troponin drawn at the first set of troponin less than 0.03, second set was 0.07. CBC had a white count of 13.6 and a platelet count of 149 otherwise was within normal limits. Glucose level is 112. D-dimer was slightly elevated at 0.52. Due to elevated troponin [facility] were called to admit the patient for further evaluation and  management.   Review of Systems: As per history of present illness otherwise negative. Constitutional:  No weight loss, night sweats, Fevers, chills, fatigue.  HEENT:  No headaches, Difficulty swallowing,Tooth/dental problems,Sore throat,  No sneezing, itching, ear ache, nasal congestion, post nasal drip,  Cardio-vascular:  No chest pain, Orthopnea, PND, swelling in lower extremities, anasarca, dizziness, palpitations  GI:  No heartburn, indigestion, abdominal pain, nausea, vomiting, diarrhea, change in bowel habits, loss of appetite  Resp:  No shortness of breath with exertion or at rest. No excess mucus, no productive cough, No non-productive cough, No coughing up of blood.No change in color of mucus.No wheezing.No chest wall deformity  Skin:  no rash or lesions.  GU:  no dysuria, change in color of urine, no urgency or frequency. No flank pain.  Musculoskeletal:  No joint pain or swelling. No decreased range of motion. No back pain.  Psych:  No change in mood or affect. No depression or anxiety. No memory loss.   Past Medical History  Diagnosis Date  . Diabetes mellitus without complication ([place])   . Hypertension   . HTN (hypertension) 09/24/2015  . DM (diabetes mellitus) ([place]) 09/24/2015  . Hyperlipidemia 09/24/2015  . Diverticulitis    Past Surgical History  Procedure Laterality Date  . Abdominal hysterectomy    . Cholecystectomy    . Hernia repair    . Colon surgery      COLECTOMY FOR DIVERTICULITIS   Social History:  reports that she has never smoked. She does not have any smokeless tobacco history on file. She reports that she does not drink alcohol or use illicit drugs.  Allergies  Allergen Reactions  .  Penicillins     Unknown reaction  Has patient had a PCN reaction causing immediate rash, facial/tongue/throat swelling, SOB or lightheadedness with hypotension: No Has patient had a PCN reaction causing severe rash involving mucus membranes or skin necrosis:  No Has patient had a PCN reaction that required hospitalization No Has patient had a PCN reaction occurring within the last 10 years: No If all of the above answers are "NO", then may proceed with Cephalosporin use.     History reviewed. No pertinent family history. mother deceased at age 50 from old age and dementia. Father deceased at age 28. Patient denies any family history of premature coronary artery disease.  Prior to Admission medications   Medication Sig Start Date End Date Taking? Authorizing Provider  atorvastatin (LIPITOR) 40 MG tablet Take 40 mg by mouth daily.   Yes Historical Provider, MD  carvedilol (COREG) 12.5 MG tablet Take 12.5 mg by mouth 2 (two) times daily with a meal.   Yes Historical Provider, MD  ciprofloxacin (CIPRO) 500 MG tablet Take 1 tablet (500 mg total) by mouth 2 (two) times daily. 09/18/15  Yes Glendell Docker, NP  glimepiride (AMARYL) 4 MG tablet Take 4 mg by mouth 2 (two) times daily.   Yes Historical Provider, MD  losartan-hydrochlorothiazide (HYZAAR) 100-12.5 MG tablet Take 1 tablet by mouth daily.   Yes Historical Provider, MD  metroNIDAZOLE (FLAGYL) 500 MG tablet Take 1 tablet (500 mg total) by mouth 2 (two) times daily. 09/18/15  Yes Glendell Docker, NP  Multiple Vitamins-Minerals (CENTRUM ADULTS) TABS Take 1 tablet by mouth daily.   Yes Historical Provider, MD   Physical Exam: Filed Vitals:   09/24/15 0230 09/24/15 0433 09/24/15 0535 09/24/15 0654  BP: 141/57 120/56 112/57 138/61  Pulse: 77 72 70 69  Temp:      TempSrc:      Resp: 15 18 18 18   Height:      Weight:      SpO2: 98% 98% 96% 100%    Wt Readings from Last 3 Encounters:  09/24/15 77.111 kg (170 lb)    General:  Well-developed well-nourished laying on gurney in no acute cardiopulmonary distress. Speaking in full sentences.  Eyes: PERRLA, EOMI, normal lids, irises & conjunctiva ENT: grossly normal hearing, lips & tongue Neck: no LAD, masses or thyromegaly. Cardiovascular:  RRR, no m/r/g. No LE edema. Chest wall is nontender to palpation. Respiratory: CTA bilaterally, no w/r/r. Normal respiratory effort. Abdomen: soft, ntnd, positive bowel sounds, no rebound, no guarding Skin: no rash or induration seen on limited exam Musculoskeletal: grossly normal tone BUE/BLE. Right upper extremity and right shoulder nontender to palpation. Psychiatric: grossly normal mood and affect, speech fluent and appropriate Neurologic: Alert and oriented 3. Cranial nerves II through XII grossly intact. No focal deficits. Visual fields are intact. Gait not tested secondary to safety.          Labs on Admission:  Basic Metabolic Panel:  Recent Labs Lab 09/18/15 1200 09/24/15 0225  NA 142 135  K 3.5 4.0  CL 106 101  CO2 27 21*  GLUCOSE 116* 112*  BUN 17 23*  CREATININE 0.76 1.04*  CALCIUM 9.4 9.1   Liver Function Tests:  Recent Labs Lab 09/18/15 1200  AST 26  ALT 16  ALKPHOS 73  BILITOT 0.8  PROT 7.8  ALBUMIN 4.3    Recent Labs Lab 09/18/15 1200  LIPASE 23   No results for input(s): AMMONIA in the last 168 hours. CBC:  Recent Labs Lab  09/18/15 1200 09/24/15 0225  WBC 8.8 13.6*  HGB 13.1 13.0  HCT 38.3 38.8  MCV 91.4 92.4  PLT 175 149*   Cardiac Enzymes:  Recent Labs Lab 09/24/15 0225 09/24/15 0528  TROPONINI <0.03 0.07*    BNP (last 3 results) No results for input(s): BNP in the last 8760 hours.  ProBNP (last 3 results) No results for input(s): PROBNP in the last 8760 hours.  CBG: No results for input(s): GLUCAP in the last 168 hours.  Radiological Exams on Admission: Dg Chest 2 View  09/24/2015  CLINICAL DATA:  Acute onset of central chest pain, with radiation to the right arm. Initial encounter. EXAM: CHEST  2 VIEW COMPARISON:  None. FINDINGS: The lungs are well-aerated and clear. There is no evidence of focal opacification, pleural effusion or pneumothorax. The heart is borderline normal in size. No acute osseous abnormalities are  seen. IMPRESSION: No acute cardiopulmonary process seen. Electronically Signed   By: Garald Balding M.D.   On: 09/24/2015 02:10    EKG: Independently reviewed. Normal sinus rhythm no ST-T wave changes.  Assessment/Plan Principal Problem:   Neck pain on right side Active Problems:   Elevated troponin   Pain of right upper extremity   HTN (hypertension)   DM (diabetes mellitus) ([place])   Hyperlipidemia   Leukocytosis   Colitis:/ DIAGNOSED 09/18/2015   #1 right neck pain/right upper extremity pain Questionable etiology. Likely musculoskeletal in nature versus cervical arthritic pain. Doubt if this is an atypical presentation for chest pain however second set of troponin was slightly elevated and patient with multiple risk factors of diabetes, hypertension, hyperlipidemia, age and gender. Will admit patient. Will get plain films of the cervical spine. Will cycle serial enzymes. Place on scheduled ibuprofen. Warm compresses. Supportive care. PT/OT.  #2 elevated troponin Patient noted to have a slightly elevated second troponin I 0.07. Patient's right neck pain and right upper extremity pain sounds more musculoskeletal in nature versus an atypical presentation of chest pain. Patient with multiple risk factors of hypertension, diabetes, hyperlipidemia, age and gender. Will cycle cardiac enzymes every 6 hours 3. Check a 2-D echo. Check a fasting lipid panel. Place on aspirin. Continue home dose Coreg. ED physician spoke with cardiology who will assess the patient.  #3 hypertension Continue home regimen Coreg. We'll hold ARB secondary to slight increase in creatinine. Follow.  #4 diabetes mellitus Check a hemoglobin A1c. Hold oral hypoglycemic agents. Place on sliding scale insulin.  #5 hyperlipidemia Check a fasting lipid panel. Continue home dose Lipitor.  #6 leukocytosis Questionable etiology. May be secondary to recently diagnosed colitis. Chest x-ray is negative. Patient is afebrile.  Check a UA with cultures and sensitivities. Continue home regimen of ciprofloxacin and Flagyl.  #7 recently diagnosed colitis Continue home regimen of ciprofloxacin and Flagyl to complete course of antibiotic therapy.  #8 prophylaxis PPI for GI prophylaxis. Lovenox for DVT prophylaxis.    Code Status: Full DVT Prophylaxis: Lovenox. Family Communication: Updated patient, daughter and son at bedside. Disposition Plan: Admit to telemetry.  Time spent: [place] MD [facility] Pager [phone]

## 2015-09-24 NOTE — Consult Note (Signed)
CONSULT NOTE  Date: 09/24/2015               Patient Name:  Caroline Cooper MRN: [id number]  DOB: February 11, 1935 Age / Sex: 80 y.o., female        PCP: No primary care provider on file. Primary Cardiologist: New/Candence Sease            Referring Physician: Grandville Silos              Reason for Consult: Chest pain , + troponin            History of Present Illness: Patient is a 80 y.o. female with a PMHx of DM , HTN , who was admitted to [facility] on 09/24/2015 for evaluation of  Right sided chest pain that started 3 days ago .   Initial troponin was negative ,  Follow up troponin was 0.07 She is admitted by [facility] and we were asked to see for further evaluation .   Pain is worse with shoulder movement .  Not Pain is right sided, right shoulder and neck   No associated dyspnea, sweats, dizziness.  [place]  Been moving lots of boxes  Active, no previous episodes of CP Has had a stress test ~ 2 years ago   - was normal     Nonsmoker    Medications: Outpatient medications:  (Not in a hospital admission)  Current medications: Current Facility-Administered Medications  Medication Dose Route Frequency Provider Last Rate Last Dose  . 0.9 %  sodium chloride infusion   Intravenous Continuous Eugenie Filler, MD 75 mL/hr at 09/24/15 0745     Current Outpatient Prescriptions  Medication Sig Dispense Refill  . atorvastatin (LIPITOR) 40 MG tablet Take 40 mg by mouth daily.    . carvedilol (COREG) 12.5 MG tablet Take 12.5 mg by mouth 2 (two) times daily with a meal.    . ciprofloxacin (CIPRO) 500 MG tablet Take 1 tablet (500 mg total) by mouth 2 (two) times daily. 14 tablet 0  . glimepiride (AMARYL) 4 MG tablet Take 4 mg by mouth 2 (two) times daily.    Marland Kitchen losartan-hydrochlorothiazide (HYZAAR) 100-12.5 MG tablet Take 1 tablet by mouth daily.    . metroNIDAZOLE (FLAGYL) 500 MG tablet Take 1 tablet (500 mg total) by mouth 2 (two) times daily. 14  tablet 0  . Multiple Vitamins-Minerals (CENTRUM ADULTS) TABS Take 1 tablet by mouth daily.       Allergies  Allergen Reactions  . Penicillins     Unknown reaction  Has patient had a PCN reaction causing immediate rash, facial/tongue/throat swelling, SOB or lightheadedness with hypotension: No Has patient had a PCN reaction causing severe rash involving mucus membranes or skin necrosis: No Has patient had a PCN reaction that required hospitalization No Has patient had a PCN reaction occurring within the last 10 years: No If all of the above answers are "NO", then may proceed with Cephalosporin use.      Past Medical History  Diagnosis Date  . Diabetes mellitus without complication ([place])   . Hypertension     Past Surgical History  Procedure Laterality Date  . Abdominal hysterectomy    . Cholecystectomy      No family history on file.  Social History:  reports that she has never smoked. She does not have any smokeless tobacco history on file. She reports that she does not drink alcohol or use illicit drugs.  Review of Systems: Constitutional:  denies fever, chills, diaphoresis, appetite change and fatigue.  HEENT: denies photophobia, eye pain, redness, hearing loss, ear pain, congestion, sore throat, rhinorrhea, sneezing, neck pain, neck stiffness and tinnitus.  Respiratory: denies SOB, DOE, cough, chest tightness, and wheezing.  Cardiovascular: denies chest pain, palpitations and leg swelling.  Gastrointestinal: denies nausea, vomiting, abdominal pain, diarrhea, constipation, blood in stool.  Genitourinary: denies dysuria, urgency, frequency, hematuria, flank pain and difficulty urinating.  Musculoskeletal: denies  myalgias, back pain, joint swelling, arthralgias and gait problem.   Skin: denies pallor, rash and wound.  Neurological: denies dizziness, seizures, syncope, weakness, light-headedness, numbness and headaches.   Hematological: denies adenopathy, easy bruising,  personal or family bleeding history.  Psychiatric/ Behavioral: denies suicidal ideation, mood changes, confusion, nervousness, sleep disturbance and agitation.    Physical Exam: BP 138/61 mmHg  Pulse 69  Temp(Src) 98.2 F (36.8 C) (Oral)  Resp 18  Ht 5\' 5"  (1.651 m)  Wt 170 lb (77.111 kg)  BMI 28.29 kg/m2  SpO2 100%  Wt Readings from Last 3 Encounters:  09/24/15 170 lb (77.111 kg)    General: Vital signs reviewed and noted. Well-developed, well-nourished, in no acute distress; alert,   Head: Normocephalic, atraumatic, sclera anicteric,   Neck: Supple. Negative for carotid bruits. No JVD   Lungs:  Clear bilaterally, no  wheezes, rales, or rhonchi. Breathing is normal   Heart: RRR with S1 S2. No murmurs, rubs, or gallops   Abdomen/ GI :  Soft, non-tender, non-distended with normoactive bowel sounds. No hepatomegaly. No rebound/guarding. No obvious abdominal masses   MSK: Strength and the appear normal for age.   Extremities: No clubbing or cyanosis. No edema.  Distal pedal pulses are 2+ and equal   Neurologic:  CN are grossly intact,  No obvious motor or sensory defect.  Alert and oriented X 3. Moves all extremities spontaneously.  Psych: Responds to questions appropriately with a normal affect.     Lab results: Basic Metabolic Panel:  Recent Labs Lab 09/18/15 1200 09/24/15 0225  NA 142 135  K 3.5 4.0  CL 106 101  CO2 27 21*  GLUCOSE 116* 112*  BUN 17 23*  CREATININE 0.76 1.04*  CALCIUM 9.4 9.1    Liver Function Tests:  Recent Labs Lab 09/18/15 1200  AST 26  ALT 16  ALKPHOS 73  BILITOT 0.8  PROT 7.8  ALBUMIN 4.3    Recent Labs Lab 09/18/15 1200  LIPASE 23   No results for input(s): AMMONIA in the last 168 hours.  CBC:  Recent Labs Lab 09/18/15 1200 09/24/15 0225  WBC 8.8 13.6*  HGB 13.1 13.0  HCT 38.3 38.8  MCV 91.4 92.4  PLT 175 149*    Cardiac Enzymes:  Recent Labs Lab 09/24/15 0225 09/24/15 0528  TROPONINI <0.03 0.07*     BNP: Invalid input(s): POCBNP  CBG: No results for input(s): GLUCAP in the last 168 hours.  Coagulation Studies: No results for input(s): LABPROT, INR in the last 72 hours.   Other results:  Personal review of EKG shows :  NSR, RVH, inc. RBBB    Imaging: Dg Chest 2 View  09/24/2015  CLINICAL DATA:  Acute onset of central chest pain, with radiation to the right arm. Initial encounter. EXAM: CHEST  2 VIEW COMPARISON:  None. FINDINGS: The lungs are well-aerated and clear. There is no evidence of focal opacification, pleural effusion or pneumothorax. The heart is borderline normal in size. No acute osseous abnormalities are seen. IMPRESSION:  No acute cardiopulmonary process seen. Electronically Signed   By: Garald Balding M.D.   On: 09/24/2015 02:10        Assessment & Plan:  1. Right shoulder pain :  Very atypical , Doubt this is due to ACS. She does have risk factors for CAD ( DM and HTN) and now has had a + troponin. I would recheck the troponin to make sure its not a lab error. Could get a lexiscan myoview tomorrow for further assessment if the troponin level is abnormal.  If its negative, I would think this current Troponin of 0.07 is not significant .   2. HTN:  Continue current meds  3. DM :  Plans per int. Med.      Thayer Headings, Brooke Bonito., MD, [facility] 09/24/2015, 8:01 AM Office - [phone] Pager [phone]

## 2015-09-24 NOTE — Discharge Instructions (Signed)
Dolor en la pared torcica (Chest Wall Pain) El dolor en la pared torcica se produce en los huesos y los msculos del pecho o alrededor de Orthoptist. A veces, una lesin Arts administrator. En ocasiones, la causa puede ser desconocida. Este dolor puede durar varias semanas. INSTRUCCIONES PARA EL CUIDADO EN EL HOGAR  Est atento a cualquier cambio en los sntomas. Tome estas medidas para Theatre stage manager dolor:   Haga reposo como se lo haya indicado el [place] actividades que causan dolor. Estas pueden ser [employer] requieren el uso de los msculos del trax, los abdominales o los laterales para levantar objetos pesados.   Si se lo indican, aplique hielo sobre la zona dolorida:  Ponga el hielo en una bolsa plstica.  Coloque una toalla entre la piel y la bolsa de hielo.  Coloque el hielo durante 49minutos, 2 a 3veces por Training and development officer.  Tome los medicamentos de venta libre y los recetados solamente como se lo haya indicado el mdico.  No consuma productos que contengan tabaco, incluidos cigarrillos, tabaco de Higher education careers adviser y Psychologist, sport and exercise. Si necesita ayuda para dejar de fumar, consulte al mdico.  Concurra a todas las visitas de control como se lo haya indicado el mdico. Esto es importante. SOLICITE ATENCIN MDICA SI:  Caroline Cooper.  El dolor de Caroline Cooper.  Aparecen nuevos sntomas. SOLICITE ATENCIN MDICA DE INMEDIATO SI:  Tiene nuseas o vmitos.  Caroline Cooper o tiene sensacin de desvanecimiento.  Tiene tos con flema (esputo) o expectora sangre al toser.  Le falta el aire.   Esta informacin no tiene Marine scientist el consejo del mdico. Asegrese de hacerle al mdico cualquier pregunta que tenga.   Document Released: 12/30/2006 Document Revised: 08/09/2015 Elsevier Interactive Patient Education [employer].

## 2015-09-24 NOTE — Progress Notes (Signed)
Received pt from ED via stretcher, pt able to ambulate with steady gait to bed with assist, telemetry applied, rates pain 5/10 on Right upper chest/neck, VSS, IVFs infusing, oriented to unit, call light placed within reach

## 2015-09-24 NOTE — Progress Notes (Addendum)
Called [facility], spoke with Alvester Chou, for pt transport to [facility] in AM, 09/25/15, confirmed pick up time of 0730  Pick up time will now be at 0700 so that Echo can be completed

## 2015-09-24 NOTE — ED Provider Notes (Signed)
CSN: [id number]     Arrival date & time 09/24/15  0106 History  By signing my name below, I, Hansel Feinstein, attest that this documentation has been prepared under the direction and in the presence of Merryl Hacker, MD. Electronically Signed: Hansel Feinstein, ED Scribe. 09/24/2015. 1:40 AM.    Chief Complaint  Patient presents with  . Chest Pain   The history is provided by the patient. No language interpreter was used.    HPI Comments: Caroline Cooper is a 80 y.o. female with h/o DM, HTN who presents to the Emergency Department complaining of moderate, constant, right-sided 8/10 CP that radiates to the neck and shoulder onset 3 days ago.  Patient was lying down when she initially noted the pain. She states pain is worsened with movement. No pain with exertion.  Reports that pain got acutely worse just PTA.  Pt denies recent trauma, injury, heavy lifting, falls. No Hx of similar symptoms. Pt is a non-smoker. No hx of MI. Otherwise healthy. Pt is currently taking Cipro and Flagyl after being seen in the ED 6 days ago for abdominal pain. She states this pain has resolved. She denies SOB, abdominal pain, nausea.    Past Medical History  Diagnosis Date  . Diabetes mellitus without complication ([place])   . Hypertension    Past Surgical History  Procedure Laterality Date  . Abdominal hysterectomy    . Cholecystectomy     No family history on file. Social History  Substance Use Topics  . Smoking status: Never Smoker   . Smokeless tobacco: None  . Alcohol Use: No   OB History    No data available     Review of Systems  Constitutional: Negative for fever and diaphoresis.  Respiratory: Negative for cough and shortness of breath.   Cardiovascular: Positive for chest pain.  Gastrointestinal: Negative for nausea and abdominal pain.  Neurological: Negative for light-headedness.  All other systems reviewed and are negative.  Allergies  Penicillins  Home Medications   Prior to Admission  medications   Medication Sig Start Date End Date Taking? Authorizing Provider  atorvastatin (LIPITOR) 40 MG tablet Take 40 mg by mouth daily.   Yes Historical Provider, MD  carvedilol (COREG) 12.5 MG tablet Take 12.5 mg by mouth 2 (two) times daily with a meal.   Yes Historical Provider, MD  ciprofloxacin (CIPRO) 500 MG tablet Take 1 tablet (500 mg total) by mouth 2 (two) times daily. 09/18/15  Yes Glendell Docker, NP  glimepiride (AMARYL) 4 MG tablet Take 4 mg by mouth 2 (two) times daily.   Yes Historical Provider, MD  losartan-hydrochlorothiazide (HYZAAR) 100-12.5 MG tablet Take 1 tablet by mouth daily.   Yes Historical Provider, MD  metroNIDAZOLE (FLAGYL) 500 MG tablet Take 1 tablet (500 mg total) by mouth 2 (two) times daily. 09/18/15  Yes Glendell Docker, NP  Multiple Vitamins-Minerals (CENTRUM ADULTS) TABS Take 1 tablet by mouth daily.   Yes Historical Provider, MD   BP 138/61 mmHg  Pulse 69  Temp(Src) 98.2 F (36.8 C) (Oral)  Resp 18  Ht 5\' 5"  (1.651 m)  Wt 170 lb (77.111 kg)  BMI 28.29 kg/m2  SpO2 100% Physical Exam  Constitutional: She is oriented to person, place, and time. She appears well-developed and well-nourished. No distress.  HENT:  Head: Normocephalic and atraumatic.  Eyes: Pupils are equal, round, and reactive to light.  Neck: Neck supple.  Cardiovascular: Normal rate, regular rhythm and normal heart sounds.   No murmur heard.  Pulmonary/Chest: Effort normal and breath sounds normal. No respiratory distress. She has no wheezes. She exhibits tenderness.  Abdominal: Soft. Bowel sounds are normal. There is no tenderness. There is no rebound.  Musculoskeletal: Normal range of motion.  Pains with range of motion of the right shoulder, no obvious deformities  Neurological: She is alert and oriented to person, place, and time.  Skin: Skin is warm and dry.  Psychiatric: She has a normal mood and affect.  Nursing note and vitals reviewed.   ED Course  Procedures  (including critical care time) DIAGNOSTIC STUDIES: Oxygen Saturation is 98% on RA, normal by my interpretation.    COORDINATION OF CARE: 1:40 AM Discussed treatment plan with pt at bedside and pt agreed to plan.   Labs Review Labs Reviewed  BASIC METABOLIC PANEL - Abnormal; Notable for the following:    CO2 21 (*)    Glucose, Bld 112 (*)    BUN 23 (*)    Creatinine, Ser 1.04 (*)    GFR calc non Af Amer 50 (*)    GFR calc Af Amer 58 (*)    All other components within normal limits  CBC - Abnormal; Notable for the following:    WBC 13.6 (*)    Platelets 149 (*)    All other components within normal limits  TROPONIN I - Abnormal; Notable for the following:    Troponin I 0.07 (*)    All other components within normal limits  TROPONIN I  D-DIMER, QUANTITATIVE (NOT AT [facility])    Imaging Review Dg Chest 2 View  09/24/2015  CLINICAL DATA:  Acute onset of central chest pain, with radiation to the right arm. Initial encounter. EXAM: CHEST  2 VIEW COMPARISON:  None. FINDINGS: The lungs are well-aerated and clear. There is no evidence of focal opacification, pleural effusion or pneumothorax. The heart is borderline normal in size. No acute osseous abnormalities are seen. IMPRESSION: No acute cardiopulmonary process seen. Electronically Signed   By: Garald Balding M.D.   On: 09/24/2015 02:10   I have personally reviewed and evaluated these images and lab results as part of my medical decision-making.   EKG Interpretation   Date/Time:  Sunday September 24 2015 01:27:43 EDT Ventricular Rate:  77 PR Interval:  167 QRS Duration: 110 QT Interval:  418 QTC Calculation: 473 R Axis:   34 Text Interpretation:  Sinus rhythm RSR' in V1 or V2, right VCD or RVH No  prior for comparison Confirmed by Rashidi Loh  MD, Mikias Lanz ([id number]) on  09/24/2015 3:13:45 AM      MDM   Final diagnoses:  Other chest pain  Elevated troponin   Patient presents with chest pain. Somewhat atypical for ACS; however  patient does have risk factors including high blood pressure, diabetes, obesity. EKG is nonischemic at this time. There is some what of a reproducible nature to the pain.    4:07 AM Workup reassuring including negative troponin. Pain is reproducible on exam and appears musculoskeletal nature. However, patient has multiple risk factors for heart disease and is 58. Will repeat troponin for a delta troponin given severity of pain increase just prior to arrival.  6:56 AM Repeat troponin elevated at 0.07.  On recheck, patient denies pain at this time. She has already received a full dose aspirin. Given increase in delta troponin and patient risk factors, will admit for formal rule out and cardiology evaluation.  7:15 AM Spoke with Dr. Haroldine Laws regarding elevated troponin. He at this time does not  feel the patient needs formal cardiac evaluation. She'll likely need stress testing.  He recommends admission for serial enzymes by the hospitalist.  Discussed with Dr. Grandville Silos.  He is requesting addition of d-dimer.  Patient is low risk. No recent travel.  Has been in the country since [place].  Admit to telemetry.   I personally performed the services described in this documentation, which was scribed in my presence. The recorded information has been reviewed and is accurate.   Merryl Hacker, MD 09/24/15 [phone]

## 2015-09-25 ENCOUNTER — Observation Stay (HOSPITAL_COMMUNITY): Payer: Medicare Other

## 2015-09-25 ENCOUNTER — Ambulatory Visit (HOSPITAL_COMMUNITY)
Admit: 2015-09-25 | Discharge: 2015-09-25 | Disposition: A | Discharge status: Home / Self Care | Payer: Medicare Other | Attending: Cardiovascular Disease | Admitting: Cardiovascular Disease

## 2015-09-25 DIAGNOSIS — M79601 Pain in right arm: Secondary | ICD-10-CM | POA: Diagnosis not present

## 2015-09-25 DIAGNOSIS — R7989 Other specified abnormal findings of blood chemistry: Secondary | ICD-10-CM | POA: Diagnosis not present

## 2015-09-25 DIAGNOSIS — K529 Noninfective gastroenteritis and colitis, unspecified: Secondary | ICD-10-CM | POA: Diagnosis not present

## 2015-09-25 DIAGNOSIS — M542 Cervicalgia: Secondary | ICD-10-CM | POA: Diagnosis not present

## 2015-09-25 DIAGNOSIS — R079 Chest pain, unspecified: Secondary | ICD-10-CM | POA: Diagnosis not present

## 2015-09-25 DIAGNOSIS — I10 Essential (primary) hypertension: Secondary | ICD-10-CM | POA: Diagnosis not present

## 2015-09-25 DIAGNOSIS — E119 Type 2 diabetes mellitus without complications: Secondary | ICD-10-CM

## 2015-09-25 LAB — CBC
HCT: 34.9 % — ABNORMAL LOW (ref 36.0–46.0)
HEMOGLOBIN: 11.5 g/dL — AB (ref 12.0–15.0)
MCH: 30.8 pg (ref 26.0–34.0)
MCHC: 33 g/dL (ref 30.0–36.0)
MCV: 93.6 fL (ref 78.0–100.0)
PLATELETS: 157 10*3/uL (ref 150–400)
RBC: 3.73 MIL/uL — AB (ref 3.87–5.11)
RDW: 13.6 % (ref 11.5–15.5)
WBC: 8.1 10*3/uL (ref 4.0–10.5)

## 2015-09-25 LAB — NM MYOCAR MULTI W/SPECT W/WALL MOTION / EF
CSEPPHR: 96 {beats}/min
Rest HR: 73 {beats}/min

## 2015-09-25 LAB — BASIC METABOLIC PANEL
ANION GAP: 8 (ref 5–15)
BUN: 15 mg/dL (ref 6–20)
CALCIUM: 8.5 mg/dL — AB (ref 8.9–10.3)
CHLORIDE: 108 mmol/L (ref 101–111)
CO2: 25 mmol/L (ref 22–32)
CREATININE: 0.75 mg/dL (ref 0.44–1.00)
GFR calc non Af Amer: >60 mL/min (ref >60)
Glucose, Bld: 117 mg/dL — ABNORMAL HIGH (ref 65–99)
Potassium: 3.8 mmol/L (ref 3.5–5.1)
SODIUM: 141 mmol/L (ref 135–145)

## 2015-09-25 LAB — LIPID PANEL
CHOL/HDL RATIO: 3.1 ratio
CHOLESTEROL: 129 mg/dL (ref 0–200)
HDL: 42 mg/dL (ref >40)
LDL Cholesterol: 71 mg/dL (ref 0–99)
Triglycerides: 80 mg/dL (ref <150)
VLDL: 16 mg/dL (ref 0–40)

## 2015-09-25 LAB — URINE CULTURE: CULTURE: NO GROWTH

## 2015-09-25 LAB — GLUCOSE, CAPILLARY
Glucose-Capillary: 105 mg/dL — ABNORMAL HIGH (ref 65–99)
Glucose-Capillary: 119 mg/dL — ABNORMAL HIGH (ref 65–99)

## 2015-09-25 LAB — HEMOGLOBIN A1C
HEMOGLOBIN A1C: 5.9 % — AB (ref 4.8–5.6)
Mean Plasma Glucose: 123 mg/dL

## 2015-09-25 LAB — TROPONIN I: Troponin I: <0.03 ng/mL (ref <0.031)

## 2015-09-25 MED ORDER — REGADENOSON 0.4 MG/5ML IV SOLN
INTRAVENOUS | Status: AC
  Filled 2015-09-25: qty 5

## 2015-09-25 MED ORDER — INFLUENZA VAC SPLIT QUAD 0.5 ML IM SUSY: 0.5000 mL | PREFILLED_SYRINGE | INTRAMUSCULAR | Status: DC

## 2015-09-25 MED ORDER — PNEUMOCOCCAL VAC POLYVALENT 25 MCG/0.5ML IJ INJ: 0.5000 mL | INJECTION | INTRAMUSCULAR | Status: DC

## 2015-09-25 MED ORDER — TECHNETIUM TC 99M SESTAMIBI GENERIC - CARDIOLITE
10.0000 | Freq: Once | INTRAVENOUS | Status: AC | PRN
  Administered 2015-09-25: 10 via INTRAVENOUS

## 2015-09-25 MED ORDER — TECHNETIUM TC 99M SESTAMIBI - CARDIOLITE
30.0000 | Freq: Once | INTRAVENOUS | Status: AC | PRN
  Administered 2015-09-25: 30 via INTRAVENOUS

## 2015-09-25 MED ORDER — PANTOPRAZOLE SODIUM 40 MG PO TBEC: 40.0000 mg | DELAYED_RELEASE_TABLET | Freq: Every day | ORAL | Status: DC

## 2015-09-25 MED ORDER — REGADENOSON 0.4 MG/5ML IV SOLN
0.4000 mg | Freq: Once | INTRAVENOUS | Status: AC
  Administered 2015-09-25: 0.4 mg via INTRAVENOUS

## 2015-09-25 MED ORDER — IBUPROFEN 400 MG PO TABS: 400.0000 mg | ORAL_TABLET | Freq: Three times a day (TID) | ORAL | Status: DC

## 2015-09-25 NOTE — Progress Notes (Signed)
Patient and family given discharge, medication and f/u instructions, verbalized understanding, IV and telemetry removed, family to transport home

## 2015-09-25 NOTE — Progress Notes (Signed)
[facility] PROGRESS NOTE  Caroline Cooper HUT:[id number] DOB: 1935/05/24 DOA: 09/24/2015 PCP: No primary care provider on file.  Assessment/Plan: #1 right sided neck pain and right upper extremity pain Likely musculoskeletal in nature. Clinical improvement on scheduled ibuprofen. Plain films of the C-spine obtained showed cervical degenerative changes and spondylosis. No acute osseous finding of fracture. Due to patient's multiple risk factors though some concern as to whether this is an anginal equivalent. Patient was also noted to have 1 elevated troponin. Repeat cardiac enzymes negative 3. 2-D echo pending. Fasting lipid panel pending. Labs this morning pending. Patient has been seen in consultation by cardiology and patient for [facility] this morning. Continue aspirin, Coreg, Lipitor, scheduled ibuprofen. Cardiology following.  #2 hypertension Continue current regimen of Coreg.  #3 hyperlipidemia Fasting lipid panel pending. Continue Lipitor.  #4 diabetes mellitus Hemoglobin A1c pending. CBGs have ranged from 65-173. Oral hypoglycemic agents on hold. Continue sliding scale insulin.  #5 recently diagnosed colitis Continue ciprofloxacin and Flagyl.  #6 leukocytosis Likely secondary to recently diagnosed colitis. His chest x-ray is negative. Patient is afebrile. Urinalysis leukocytes negative nitrite negative. Continue current antibiotics of ciprofloxacin and Flagyl.  #7 elevated troponin Likely a lab error. Repeat troponins negative 3. 2-D echo pending. Fasting lipid panel pending. Due to patient's multiple risk factors patient has been seen in consultation by cardiology and patient scheduled for [facility] today. Continue aspirin, Coreg, Lipitor. Cardiology following.  #8 prophylaxis PPI for GI prophylaxis. Lovenox for DVT prophylaxis.  Code Status: Full Family Communication: Updated patient and daughter at bedside. Disposition Plan: Pending Lexi scan Myoview and  per cardiology.   Consultants:  Cardiology: Dr. Acie Fredrickson 09/24/2015  Procedures:  Plain films of the C-spine 09/24/2015  Antibiotics:  None  HPI/Subjective: Patient states right neck pain and right upper extremity pain improved since admission. Patient denies any chest pain. No shortness of breath.  Objective: Filed Vitals:   09/25/15 0531  BP: 135/65  Pulse: 59  Temp: 98.3 F (36.8 C)  Resp: 20    Intake/Output Summary (Last 24 hours) at 09/25/15 0809 Last data filed at 09/25/15 0738  Gross per 24 hour  Intake 3291.25 ml  Output    750 ml  Net 2541.25 ml   Filed Weights   09/24/15 0124 09/25/15 0528  Weight: 77.111 kg (170 lb) 70.398 kg (155 lb 3.2 oz)    Exam:   General:  NAD  Cardiovascular: RRR  Respiratory: CTAB  Abdomen: Soft, nontender, nondistended, positive bowel sounds.  Musculoskeletal: No clubbing cyanosis or edema.   Data Reviewed: Basic Metabolic Panel:  Recent Labs Lab 09/18/15 1200 09/24/15 0225 09/24/15 1054  NA 142 135  --   K 3.5 4.0  --   CL 106 101  --   CO2 27 21*  --   GLUCOSE 116* 112*  --   BUN 17 23*  --   CREATININE 0.76 1.04*  --   CALCIUM 9.4 9.1  --   MG  --   --  2.1   Liver Function Tests:  Recent Labs Lab 09/18/15 1200  AST 26  ALT 16  ALKPHOS 73  BILITOT 0.8  PROT 7.8  ALBUMIN 4.3    Recent Labs Lab 09/18/15 1200  LIPASE 23   No results for input(s): AMMONIA in the last 168 hours. CBC:  Recent Labs Lab 09/18/15 1200 09/24/15 0225  WBC 8.8 13.6*  HGB 13.1 13.0  HCT 38.3 38.8  MCV 91.4 92.4  PLT 175 149*  Cardiac Enzymes:  Recent Labs Lab 09/24/15 0225 09/24/15 0528 09/24/15 1054 09/24/15 1811 09/24/15 2348  TROPONINI <0.03 0.07* <0.03 <0.03 <0.03   BNP (last 3 results) No results for input(s): BNP in the last 8760 hours.  ProBNP (last 3 results) No results for input(s): PROBNP in the last 8760 hours.  CBG:  Recent Labs Lab 09/24/15 1155 09/24/15 1711  09/24/15 1807 09/24/15 2140  GLUCAP 173* 65 113* 121*    No results found for this or any previous visit (from the past 240 hour(s)).   Studies: Dg Chest 2 View  09/24/2015  CLINICAL DATA:  Acute onset of central chest pain, with radiation to the right arm. Initial encounter. EXAM: CHEST  2 VIEW COMPARISON:  None. FINDINGS: The lungs are well-aerated and clear. There is no evidence of focal opacification, pleural effusion or pneumothorax. The heart is borderline normal in size. No acute osseous abnormalities are seen. IMPRESSION: No acute cardiopulmonary process seen. Electronically Signed   By: Garald Balding M.D.   On: 09/24/2015 02:10   Dg Cervical Spine Complete  09/24/2015  CLINICAL DATA:  Right neck and arm pain for 3 days EXAM: CERVICAL SPINE - COMPLETE 4+ VIEW COMPARISON:  None. FINDINGS: Normal cervical spine alignment. Mild spondylosis and degenerative disc disease from C4-C7. Mid cervical facet arthropathy from C3 to the C6. Normal prevertebral soft tissues. Facets remain aligned. Foramina are patent. Intact odontoid. Lung apices are clear. Atherosclerosis of the thoracic aortic arch and the carotid bifurcations. IMPRESSION: Cervical degenerative changes and spondylosis. No acute osseous finding or fracture by plain radiography. Carotid and thoracic aortic atherosclerosis Electronically Signed   By: Jerilynn Mages.  Shick M.D.   On: 09/24/2015 09:00    Scheduled Meds: . aspirin EC  81 mg Oral Daily  . atorvastatin  40 mg Oral q1800  . carvedilol  12.5 mg Oral BID WC  . ciprofloxacin  500 mg Oral BID  . enoxaparin (LOVENOX) injection  40 mg Subcutaneous Q24H  . ibuprofen  400 mg Oral TID  . [START ON 09/26/2015] Influenza vac split quadrivalent PF  0.5 mL Intramuscular Tomorrow-1000  . insulin aspart  0-15 Units Subcutaneous TID WC  . metroNIDAZOLE  500 mg Oral BID  . multivitamin with minerals  1 tablet Oral Daily  . pantoprazole  40 mg Oral Daily  . [START ON 09/26/2015] pneumococcal 23  valent vaccine  0.5 mL Intramuscular Tomorrow-1000  . regadenoson  0.4 mg Intravenous Once  . sodium chloride  3 mL Intravenous Q12H   Continuous Infusions: . sodium chloride Stopped (09/25/15 0738)    Principal Problem:   Neck pain on right side Active Problems:   Elevated troponin   Pain of right upper extremity   HTN (hypertension)   DM (diabetes mellitus) ([place])   Hyperlipidemia   Leukocytosis   Colitis:/ DIAGNOSED 09/18/2015    Time spent: 35 mins    [facility] mMD [facility] Pager [phone]. If 7PM-7AM, please contact night-coverage at [email or web address], password [facility] 09/25/2015, 8:09 AM

## 2015-09-25 NOTE — Progress Notes (Signed)
The patient tolerated the Lexiscan well.  Alanee Ting, PAC 

## 2015-09-25 NOTE — Progress Notes (Signed)
Pt left per [facility] for [facility] for procedure, telemetry removed

## 2015-09-25 NOTE — Progress Notes (Signed)
OT Cancellation Note  Patient Details Name: Roshawn Lacina MRN: [id number] DOB: 1935-01-08   Cancelled Treatment:    Reason Eval/Treat Not Completed: Patient at procedure or test/ unavailable. Note pt at test this am. Will try back later today or in am.  Jules Schick  [phone] 09/25/2015, 8:51 AM

## 2015-09-25 NOTE — Discharge Summary (Signed)
Physician Discharge Summary  Randie Bloodgood DQQ:[id number] DOB: Mar 14, 1935 DOA: 09/24/2015  PCP: No primary care provider on file.  Admit date: 09/24/2015 Discharge date: 09/25/2015  Time spent: 60 minutes  Recommendations for Outpatient Follow-up:  1. Follow-up with PCP in 2 weeks for follow-up on right upper extremity and right neck pain which was musculoskeletal in nature.  Discharge Diagnoses:  Principal Problem:   Neck pain on right side Active Problems:   Elevated troponin   Pain of right upper extremity   HTN (hypertension)   DM (diabetes mellitus) (HCC)   Hyperlipidemia   Leukocytosis   Colitis:/ DIAGNOSED 09/18/2015   Neck pain   Type 2 diabetes, HbA1c goal < 7% ([place])   Discharge Condition: Stable and improved.  Diet recommendation: Carb modified diet.  Filed Weights   09/24/15 0124 09/25/15 0528  Weight: 77.111 kg (170 lb) 70.398 kg (155 lb 3.2 oz)    History of present illness:  Caroline Cooper is a 80 y.o. female  From [place] who recently moved to the area in June 2016 with a history of diabetes mellitus, hypertension, hyperlipidemia presented to the ED with a three-day history of worsening constant right upper extremity right shoulder and right neck pain which she states feels like a headache. Patient stated pain worse with movement however denied any worsening with exertion. Patient also stated when she tried to get something with her right arm she had pain. Patient denied any recent trauma. No recent falls. Patient denied any numbness or tingling down the right upper extremity. Patient denied any shortness of breath, no chest pain, no palpitations, no fever, no chills, no nausea, no vomiting, no abdominal pain, no diarrhea, no constipation, no dysuria, no weakness, no melena, no hematemesis, no hematochezia. Patient denied any family history of coronary artery disease. Patient denied any recent surgeries. No recent long car rides. Patient was recently seen in the  ED on 09/18/2015 and was diagnosed with a colitis and being treated with Cipro and Flagyl with clinical improvement. Patient was seen in the ED chest x-ray which was done was negative for any acute cardiopulmonary disease. Basic metabolic profile done had a BUN of 23 creatinine of 1.04 otherwise was within normal limits. Troponin drawn at the first set of troponin less than 0.03, second set was 0.07. CBC had a white count of 13.6 and a platelet count of 149 otherwise was within normal limits. Glucose level is 112. D-dimer was slightly elevated at 0.52. Due to elevated troponin [facility] were called to admit the patient for further evaluation and management.    Hospital Course:  #1 right sided neck pain and right upper extremity pain Likely musculoskeletal in nature. Clinical improvement on scheduled ibuprofen. Plain films of the C-spine obtained showed cervical degenerative changes and spondylosis. No acute osseous finding of fracture. Due to patient's multiple risk factors though some concern as to whether this is an anginal equivalent. Patient was also noted to have 1 elevated troponin. Repeat cardiac enzymes negative 3. Fasting lipid panel with a total cholesterol of 129 HDL of 42 LDL of 71. Patient was maintained on aspirin, Coreg, Lipitor and scheduled ibuprofen. Patient has been seen in consultation by cardiology and patient for [facility] on 09/25/2015. Lexiscan Myoview had no reversible ischemia or infarction, normal left ventricular wall motion, ejection fraction 77%. Per cardiology no further cardiac workup as needed. Patient improved clinically and was stable and improved condition on day of discharge.   #2 hypertension Patient was maintained on a  home regimen of Coreg. Patient be discharged on a home regimen of Coreg and losartan. Outpatient follow-up.   #3 hyperlipidemia Fasting lipid panel obtained had a total cholesterol of 129 LDL of 71 HDL of 42. Patient was maintained  on a home regimen of Lipitor. Outpatient follow-up.   #4 diabetes mellitus Hemoglobin A1c was 5.9. Patient's oral hypoglycemic agents were held. Patient was maintained on a sliding scale insulin.  #5 recently diagnosed colitis Patient was continued on home regimen of ciprofloxacin and Flagyl which was recently started for colitis.   #6 leukocytosis Likely secondary to recently diagnosed colitis. His chest x-ray is negative. Patient is afebrile. Urinalysis leukocytes negative nitrite negative. Patient was maintained on a home regimen of Cipro and Flagyl which was recently started for colitis. Patient's leukocytosis had resolved by day of discharge.   #7 elevated troponin Likely a lab error. Repeat troponins negative 3.  Fasting lipid panel which was obtained at a total cholesterol of 129 and LDL of 71. Due to patient's multiple risk factors patient has been seen in consultation by cardiology and patient scheduled for [employer] which was done on 09/25/2015. Lexiscan Myoview which was done was normal with no reversible ischemia or infarction, normal left ventricular wall motion, EF 77%. Patient was maintained on her home regimen of Coreg and Lipitor. Aspirin was added. It was felt per cardiology that no further cardiac workup was needed.    Procedures:  Plain films of the C-spine 09/24/2015  Lexiscan Myoview stress test 09/25/2015  Consultations:  Cardiology: Dr. Acie Fredrickson 09/24/2015  Discharge Exam: Filed Vitals:   09/25/15 1230  BP: 151/67  Pulse: 69  Temp: 98 F (36.7 C)  Resp: 20    General: NAD Cardiovascular: RRR Respiratory: CTAB  Discharge Instructions   Discharge Instructions    Diet Carb Modified    Complete by:  As directed      Discharge instructions    Complete by:  As directed   Follow up PCP in 2 weeks.     Increase activity slowly    Complete by:  As directed           Current Discharge Medication List    START taking these medications    Details  ibuprofen (ADVIL,MOTRIN) 400 MG tablet Take 1 tablet (400 mg total) by mouth 3 (three) times daily. Take 3 times daily x 4 days then use as needed. Qty: 30 tablet, Refills: 0    pantoprazole (PROTONIX) 40 MG tablet Take 1 tablet (40 mg total) by mouth daily. Qty: 30 tablet, Refills: 0      CONTINUE these medications which have NOT CHANGED   Details  atorvastatin (LIPITOR) 40 MG tablet Take 40 mg by mouth daily.    carvedilol (COREG) 12.5 MG tablet Take 12.5 mg by mouth 2 (two) times daily with a meal.    ciprofloxacin (CIPRO) 500 MG tablet Take 1 tablet (500 mg total) by mouth 2 (two) times daily. Qty: 14 tablet, Refills: 0    glimepiride (AMARYL) 4 MG tablet Take 4 mg by mouth 2 (two) times daily.    losartan (COZAAR) 50 MG tablet Take 50 mg by mouth daily.    metroNIDAZOLE (FLAGYL) 500 MG tablet Take 1 tablet (500 mg total) by mouth 2 (two) times daily. Qty: 14 tablet, Refills: 0    Multiple Vitamins-Minerals (CENTRUM ADULTS) TABS Take 1 tablet by mouth daily.       Allergies  Allergen Reactions  . Penicillins     Unknown reaction  Has patient had a PCN reaction causing immediate rash, facial/tongue/throat swelling, SOB or lightheadedness with hypotension: No Has patient had a PCN reaction causing severe rash involving mucus membranes or skin necrosis: No Has patient had a PCN reaction that required hospitalization No Has patient had a PCN reaction occurring within the last 10 years: No If all of the above answers are "NO", then may proceed with Cephalosporin use.    Follow-up Information    Schedule an appointment as soon as possible for a visit in 2 weeks to follow up.   Why:  F/U PCP in 2 weeks       The results of significant diagnostics from this hospitalization (including imaging, microbiology, ancillary and laboratory) are listed below for reference.    Significant Diagnostic Studies: Dg Chest 2 View  09/24/2015  CLINICAL DATA:  Acute onset of  central chest pain, with radiation to the right arm. Initial encounter. EXAM: CHEST  2 VIEW COMPARISON:  None. FINDINGS: The lungs are well-aerated and clear. There is no evidence of focal opacification, pleural effusion or pneumothorax. The heart is borderline normal in size. No acute osseous abnormalities are seen. IMPRESSION: No acute cardiopulmonary process seen. Electronically Signed   By: Garald Balding M.D.   On: 09/24/2015 02:10   Dg Cervical Spine Complete  09/24/2015  CLINICAL DATA:  Right neck and arm pain for 3 days EXAM: CERVICAL SPINE - COMPLETE 4+ VIEW COMPARISON:  None. FINDINGS: Normal cervical spine alignment. Mild spondylosis and degenerative disc disease from C4-C7. Mid cervical facet arthropathy from C3 to the C6. Normal prevertebral soft tissues. Facets remain aligned. Foramina are patent. Intact odontoid. Lung apices are clear. Atherosclerosis of the thoracic aortic arch and the carotid bifurcations. IMPRESSION: Cervical degenerative changes and spondylosis. No acute osseous finding or fracture by plain radiography. Carotid and thoracic aortic atherosclerosis Electronically Signed   By: Jerilynn Mages.  Shick M.D.   On: 09/24/2015 09:00   Ct Abdomen Pelvis W Contrast  09/18/2015  CLINICAL DATA:  Diffuse abdominal pain and diarrhea for 3 days. EXAM: CT ABDOMEN AND PELVIS WITH CONTRAST TECHNIQUE: Multidetector CT imaging of the abdomen and pelvis was performed using the standard protocol following bolus administration of intravenous contrast. CONTRAST:  168mL OMNIPAQUE IOHEXOL 300 MG/ML  SOLN COMPARISON:  None. FINDINGS: Lower chest:  No acute findings. Hepatobiliary: Probable tiny sub-cm right hepatic lobe cyst noted. No liver masses are identified. Prior cholecystectomy noted. Mild biliary ductal dilatation is likely related to prior cholecystectomy. Pancreas: No mass, inflammatory changes, or other significant abnormality. Spleen: Within normal limits in size and appearance. Adrenals/Urinary  Tract: No masses identified. No evidence of hydronephrosis. Stomach/Bowel: No evidence of bowel obstruction. Mild colonic wall thickening is seen involving the cecum, ascending colon, and hepatic flexure, consistent with colitis. No evidence of abscess or free fluid. Mild left colonic diverticulosis is seen without evidence of diverticulitis. Vascular/Lymphatic: No pathologically enlarged lymph nodes. No evidence of abdominal aortic aneurysm. Aortic atherosclerotic plaque noted. Reproductive: Prior hysterectomy noted. Adnexal regions are unremarkable in appearance. Other: A small paraumbilical ventral hernia is seen containing a loop of small bowel. No evidence of small bowel wall thickening or dilatation. Several left upper quadrant surgical clips noted. Musculoskeletal:  No suspicious bone lesions identified. IMPRESSION: Mild wall thickening involving the ascending colon and hepatic flexure, consistent with colitis. No evidence of abscess or other complication. Mild biliary ductal dilatation. No etiology visualized by CT, and this may be related to prior cholecystectomy. Suggest correlation with liver function tests ;  consider MRCP for further evaluation if clinically warranted. Small paraumbilical ventral hernia containing a loop of small bowel. No evidence of small bowel obstruction or ischemia. Electronically Signed   By: Earle Gell M.D.   On: 09/18/2015 14:31   Nm Myocar Multi W/spect W/wall Motion / Ef  09/25/2015  CLINICAL DATA:  Right-sided chest pain for 3 days. History of diabetes and hypertension. Initial encounter. EXAM: MYOCARDIAL IMAGING WITH SPECT (REST AND PHARMACOLOGIC-STRESS) GATED LEFT VENTRICULAR WALL MOTION STUDY LEFT VENTRICULAR EJECTION FRACTION TECHNIQUE: Standard myocardial SPECT imaging was performed after resting intravenous injection of 10 mCi Tc-90m sestamibi. Subsequently, intravenous infusion of Lexiscan was performed under the supervision of the Cardiology staff. At peak effect  of the drug, 30 mCi Tc-48m sestamibi was injected intravenously and standard myocardial SPECT imaging was performed. Quantitative gated imaging was also performed to evaluate left ventricular wall motion, and estimate left ventricular ejection fraction. COMPARISON:  Radiographs 09/24/2015. FINDINGS: Perfusion: No decreased activity in the left ventricle on stress imaging to suggest reversible ischemia or infarction. Wall Motion: Normal left ventricular wall motion. No left ventricular dilation. Left Ventricular Ejection Fraction: 77 % End diastolic volume 63 ml End systolic volume 15 ml IMPRESSION: 1. No reversible ischemia or infarction. 2. Normal left ventricular wall motion. 3. Left ventricular ejection fraction 77% 4. Low-risk stress test findings*. *2012 Appropriate Use Criteria for Coronary Revascularization Focused Update: J Am Coll Cardiol. 0017;49(4):496-759. http://content.airportbarriers.com.aspx?articleid=1201161 Electronically Signed   By: Richardean Sale M.D.   On: 09/25/2015 13:40    Microbiology: Recent Results (from the past 240 hour(s))  Culture, Urine     Status: None   Collection Time: 09/24/15  8:32 AM  Result Value Ref Range Status   Specimen Description URINE, CLEAN CATCH  Final   Special Requests NONE  Final   Culture   Final    NO GROWTH 1 DAY Performed at [facility]    Report Status 09/25/2015 FINAL  Final     Labs: Basic Metabolic Panel:  Recent Labs Lab 09/24/15 0225 09/24/15 1054 09/25/15 0606  NA 135  --  141  K 4.0  --  3.8  CL 101  --  108  CO2 21*  --  25  GLUCOSE 112*  --  117*  BUN 23*  --  15  CREATININE 1.04*  --  0.75  CALCIUM 9.1  --  8.5*  MG  --  2.1  --    Liver Function Tests: No results for input(s): AST, ALT, ALKPHOS, BILITOT, PROT, ALBUMIN in the last 168 hours. No results for input(s): LIPASE, AMYLASE in the last 168 hours. No results for input(s): AMMONIA in the last 168 hours. CBC:  Recent Labs Lab 09/24/15 0225  09/25/15 0606  WBC 13.6* 8.1  HGB 13.0 11.5*  HCT 38.8 34.9*  MCV 92.4 93.6  PLT 149* 157   Cardiac Enzymes:  Recent Labs Lab 09/24/15 0225 09/24/15 0528 09/24/15 1054 09/24/15 1811 09/24/15 2348  TROPONINI <0.03 0.07* <0.03 <0.03 <0.03   BNP: BNP (last 3 results) No results for input(s): BNP in the last 8760 hours.  ProBNP (last 3 results) No results for input(s): PROBNP in the last 8760 hours.  CBG:  Recent Labs Lab 09/24/15 1711 09/24/15 1807 09/24/15 2140 09/25/15 0527 09/25/15 1224  GLUCAP 65 113* 121* 105* 119*       Signed:  Audryanna Zurita MD [facility] 09/25/2015, 3:00 PM

## 2015-09-25 NOTE — Progress Notes (Signed)
Patient ID: Caroline Cooper, female   DOB: [birth year], 80 y.o.   MRN: [id number] Myovue is normal EF 77% no ischemia No further cardiac w/u Will sign off  Jenkins Rouge

## 2015-09-25 NOTE — Progress Notes (Signed)
PT Cancellation Note  Patient Details Name: Caroline Cooper MRN: [id number] DOB: 1935/11/22   Cancelled Treatment:    Reason Eval/Treat Not Completed: Patient at procedure or test/unavailable. Pt at [facility] nuclear medicine for test. Will follow.    Blondell Reveal Kistler 09/25/2015, 9:57 AM [phone]

## 2015-09-25 NOTE — Evaluation (Signed)
Physical Therapy Evaluation Patient Details Name: Caroline Cooper MRN: [id number] DOB: 13-Feb-1935 Today's Date: 09/25/2015   History of Present Illness  From [place] who recently moved to the area in June 2016 with a history of diabetes mellitus, hypertension, hyperlipidemia presenting to the ED with a three-day history of worsening constant right upper extremity right shoulder and right neck pain which she states feels like a headache. Elevated troponin in ED, last 3 troponin levels, drawn yesterday, have been normal.    Clinical Impression  Pt admitted with above diagnosis. Pt currently with functional limitations due to the deficits listed below (see PT Problem List). Pt independent with bed mobility and transfers. Pt self limited ambulation to 5' as she stated she needs to rest right now (she's tired from testing at [facility]) and will walk tomorrow. Will assess ambulation tomorrow.  Pt will benefit from skilled PT to increase their independence and safety with mobility to allow discharge to the venue listed below.       Follow Up Recommendations No PT follow up    Equipment Recommendations  None recommended by PT    Recommendations for Other Services       Precautions / Restrictions Precautions Precautions: None Precaution Comments: pt denies falls Restrictions Weight Bearing Restrictions: No      Mobility  Bed Mobility Overal bed mobility: Independent                Transfers Overall transfer level: Independent                  Ambulation/Gait Ambulation/Gait assistance: Independent Ambulation Distance (Feet): 5 Feet Assistive device: None Gait Pattern/deviations: WFL(Within Functional Limits)     General Gait Details: after walking a few feet pt stated she was tired from testing today (she was taken to [facility]) and that she doesn't want to walk today, she needs to rest.   Stairs            Wheelchair Mobility    Modified Rankin (Stroke Patients  Only)       Balance Overall balance assessment: Independent                                           Pertinent Vitals/Pain Pain Assessment: No/denies pain    Home Living Family/patient expects to be discharged to:: Private residence Living Arrangements: Spouse/significant other Available Help at Discharge: Family;Available 24 hours/day   Home Access: Stairs to enter   Entrance Stairs-Number of Steps: 1 Home Layout: One level Home Equipment: None      Prior Function Level of Independence: Independent               Hand Dominance        Extremity/Trunk Assessment   Upper Extremity Assessment: Defer to OT evaluation           Lower Extremity Assessment: Overall WFL for tasks assessed      Cervical / Trunk Assessment: Normal  Communication   Communication: Prefers language other than English (speaks some [place], Spanish is 1st language)  Cognition Arousal/Alertness: Awake/alert Behavior During Therapy: WFL for tasks assessed/performed Overall Cognitive Status: Within Functional Limits for tasks assessed                      General Comments      Exercises        Assessment/Plan  PT Assessment Patient needs continued PT services  PT Diagnosis Generalized weakness   PT Problem List Decreased activity tolerance  PT Treatment Interventions Gait training   PT Goals (Current goals can be found in the Care Plan section) Acute Rehab PT Goals Patient Stated Goal: to walk PT Goal Formulation: With patient/family Time For Goal Achievement: 10/02/15 Potential to Achieve Goals: Good    Frequency Min 3X/week   Barriers to discharge        Co-evaluation               End of Session Equipment Utilized During Treatment: Gait belt Activity Tolerance: Patient limited by fatigue Patient left: in bed;with call bell/phone within reach;with family/visitor present Nurse Communication: Mobility status    Functional  Assessment Tool Used: clinical judgement Functional Limitation: Mobility: Walking and moving around Mobility: Walking and Moving Around Current Status [phone]): At least 1 percent but less than 20 percent impaired, limited or restricted Mobility: Walking and Moving Around Goal Status [phone]): 0 percent impaired, limited or restricted    Time: 0932-3557 PT Time Calculation (min) (ACUTE ONLY): 12 min   Charges:   PT Evaluation $Initial PT Evaluation Tier I: 1 Procedure     PT G Codes:   PT G-Codes **NOT FOR INPATIENT CLASS** Functional Assessment Tool Used: clinical judgement Functional Limitation: Mobility: Walking and moving around Mobility: Walking and Moving Around Current Status (D2202): At least 1 percent but less than 20 percent impaired, limited or restricted Mobility: Walking and Moving Around Goal Status [phone]): 0 percent impaired, limited or restricted    Philomena Doheny 09/25/2015, 2:35 PM [phone]

## 2015-09-25 NOTE — Progress Notes (Signed)
Patient returned from [facility] via [place] transport, MD made aware, diet resumed, IVFs and telemetry reapplied, no c/o pain voiced

## 2015-09-25 NOTE — Plan of Care (Signed)
Problem: Phase II Progression Outcomes Goal: Stress Test if indicated Outcome: Completed/Met Date Met:  09/25/15 Scheduled for 0830 on 09/25/15

## 2015-09-25 NOTE — Care Management Note (Signed)
Case Management Note  Patient Details  Name: Peace Noyes MRN: [id number] Date of Birth: 1935/01/07  Subjective/Objective:   [place] appt for pcp set.Patient informed.                 Action/Plan:d/c home no further d/c needs.   Expected Discharge Date:                  Expected Discharge Plan:  Home/Self Care  In-House Referral:     Discharge [facility]  Post Acute Care Choice:    Choice offered to:     DME Arranged:    DME Agency:     HH Arranged:    HH Agency:     Status of Service:  Completed, signed off  Medicare Important Message Given:    Date Medicare IM Given:    Medicare IM give by:    Date Additional Medicare IM Given:    Additional Medicare Important Message give by:     If discussed at [place] Level of Stay Meetings, dates discussed:    Additional Comments:  Dessa Phi, RN 09/25/2015, 3:36 PM

## 2015-09-25 NOTE — Progress Notes (Signed)
Patient Name: Caroline Cooper Date of Encounter: 09/25/2015  Principal Problem:   Neck pain on right side Active Problems:   Elevated troponin   Pain of right upper extremity   HTN (hypertension)   DM (diabetes mellitus) ([place])   Hyperlipidemia   Leukocytosis   Colitis:/ DIAGNOSED 09/18/2015    Primary Cardiologist: New - Dr. Acie Fredrickson Patient Profile: 80 yo female w/ PMH of HTN, Type 2 DM, and HLD admitted on 09/24/2015 for right-sided chest pain of 3 days duration which was worse with shoulder movement.  SUBJECTIVE: Denies any recurrence in her pain overnight. Denies shortness of breath or palpitations. Has been NPO since midnight for NST.  OBJECTIVE Filed Vitals:   09/24/15 1430 09/24/15 2141 09/25/15 0528 09/25/15 0531  BP: 111/53 132/56  135/65  Pulse: 60 63  59  Temp: 97.5 F (36.4 C) 98.7 F (37.1 C)  98.3 F (36.8 C)  TempSrc: Oral Oral  Oral  Resp: 20 20  20   Height:      Weight:   155 lb 3.2 oz (70.398 kg)   SpO2: 99% 100%  99%    Intake/Output Summary (Last 24 hours) at 09/25/15 0741 Last data filed at 09/25/15 6761  Gross per 24 hour  Intake 3291.25 ml  Output    750 ml  Net 2541.25 ml   Filed Weights   09/24/15 0124 09/25/15 0528  Weight: 170 lb (77.111 kg) 155 lb 3.2 oz (70.398 kg)    PHYSICAL EXAM General: Well developed, well nourished, female in no acute distress. Head: Normocephalic, atraumatic.  Neck: Supple without bruits, JVD not elevated. Lungs:  Resp regular and unlabored, CTA without wheezing or rales. Heart: RRR, S1, S2, no S3, S4, or murmur; no rub. Abdomen: Soft, non-tender, non-distended with normoactive bowel sounds. No hepatomegaly. No rebound/guarding. No obvious abdominal masses. Extremities: No clubbing, cyanosis, or edema. Distal pedal pulses are 2+ bilaterally. Neuro: Alert and oriented X 3. Moves all extremities spontaneously. Psych: Normal affect.  LABS: CBC: Recent Labs  09/24/15 0225  WBC 13.6*  HGB 13.0  HCT  38.8  MCV 92.4  PLT 950*   Basic Metabolic Panel: Recent Labs  09/24/15 0225 09/24/15 1054  NA 135  --   K 4.0  --   CL 101  --   CO2 21*  --   GLUCOSE 112*  --   BUN 23*  --   CREATININE 1.04*  --   CALCIUM 9.1  --   MG  --  2.1   Cardiac Enzymes: Recent Labs  09/24/15 1054 09/24/15 1811 09/24/15 2348  TROPONINI <0.03 <0.03 <0.03   No results for input(s): TROPIPOC in the last 72 hours. BNP: No results found for: BNP No results found for: PROBNP D-dimer: Recent Labs  09/24/15 0741  DDIMER 0.52*   Thyroid Function Tests: Recent Labs  09/24/15 1054  TSH 1.139   TELE:  NSR with rate in 50's - 60's. Occasional PVC's.      ECHO: Pending  Radiology/Studies: Dg Chest 2 View: 09/24/2015  CLINICAL DATA:  Acute onset of central chest pain, with radiation to the right arm. Initial encounter. EXAM: CHEST  2 VIEW COMPARISON:  None. FINDINGS: The lungs are well-aerated and clear. There is no evidence of focal opacification, pleural effusion or pneumothorax. The heart is borderline normal in size. No acute osseous abnormalities are seen. IMPRESSION: No acute cardiopulmonary process seen. Electronically Signed   By: Garald Balding M.D.   On: 09/24/2015 02:10  Dg Cervical Spine Complete: 09/24/2015  CLINICAL DATA:  Right neck and arm pain for 3 days EXAM: CERVICAL SPINE - COMPLETE 4+ VIEW COMPARISON:  None. FINDINGS: Normal cervical spine alignment. Mild spondylosis and degenerative disc disease from C4-C7. Mid cervical facet arthropathy from C3 to the C6. Normal prevertebral soft tissues. Facets remain aligned. Foramina are patent. Intact odontoid. Lung apices are clear. Atherosclerosis of the thoracic aortic arch and the carotid bifurcations. IMPRESSION: Cervical degenerative changes and spondylosis. No acute osseous finding or fracture by plain radiography. Carotid and thoracic aortic atherosclerosis Electronically Signed   By: Jerilynn Mages.  Shick M.D.   On: 09/24/2015 09:00      Current Medications:  . aspirin EC  81 mg Oral Daily  . atorvastatin  40 mg Oral q1800  . carvedilol  12.5 mg Oral BID WC  . ciprofloxacin  500 mg Oral BID  . enoxaparin (LOVENOX) injection  40 mg Subcutaneous Q24H  . ibuprofen  400 mg Oral TID  . [START ON 09/26/2015] Influenza vac split quadrivalent PF  0.5 mL Intramuscular Tomorrow-1000  . insulin aspart  0-15 Units Subcutaneous TID WC  . metroNIDAZOLE  500 mg Oral BID  . multivitamin with minerals  1 tablet Oral Daily  . pantoprazole  40 mg Oral Daily  . [START ON 09/26/2015] pneumococcal 23 valent vaccine  0.5 mL Intramuscular Tomorrow-1000  . regadenoson  0.4 mg Intravenous Once  . sodium chloride  3 mL Intravenous Q12H   . sodium chloride Stopped (09/25/15 0738)    ASSESSMENT AND PLAN:  1. Right Shoulder Pain - duration of 3 days and worse with movement. Recently been lifting heavy boxes prior to onset of pain. - Has cardiac risk factors of DM, HTN, and HLD. Initial troponin slightly elevated at 0.07. Cyclic markers have been negative. - Echocardiogram to be obtained today. - Has been NPO in anticipation of Lexiscan Myoview this AM. Awaiting transport to [facility] for testing.  2. HTN - continue current medications.  3. Type 2 DM - per admitting team.  4. HLD - continue statin therapy.   Signed, Erma Heritage , PA-C 7:41 AM 09/25/2015 Pager: [phone]  Patient seen at [facility] Nuclear department  Very atypical pain r/o doubt this is ischemic pain Exam , ECG and labs benign  Possible d/c after myovue if normal  Jenkins Rouge

## 2015-09-28 DIAGNOSIS — E162 Hypoglycemia, unspecified: Secondary | ICD-10-CM | POA: Diagnosis not present

## 2015-09-28 DIAGNOSIS — E161 Other hypoglycemia: Secondary | ICD-10-CM | POA: Diagnosis not present

## 2015-10-03 ENCOUNTER — Encounter: Payer: Self-pay | Admitting: Family Medicine

## 2015-10-03 ENCOUNTER — Ambulatory Visit: Payer: Medicare Other | Attending: Family Medicine | Admitting: Family Medicine

## 2015-10-03 VITALS — BP 180/70 | HR 69 | Temp 97.6°F | Resp 18 | Ht 62.5 in | Wt 154.0 lb

## 2015-10-03 DIAGNOSIS — I10 Essential (primary) hypertension: Secondary | ICD-10-CM | POA: Diagnosis not present

## 2015-10-03 DIAGNOSIS — Z23 Encounter for immunization: Secondary | ICD-10-CM

## 2015-10-03 DIAGNOSIS — Z0000 Encounter for general adult medical examination without abnormal findings: Secondary | ICD-10-CM

## 2015-10-03 DIAGNOSIS — M542 Cervicalgia: Secondary | ICD-10-CM

## 2015-10-03 DIAGNOSIS — E785 Hyperlipidemia, unspecified: Secondary | ICD-10-CM

## 2015-10-03 DIAGNOSIS — H539 Unspecified visual disturbance: Secondary | ICD-10-CM

## 2015-10-03 DIAGNOSIS — E119 Type 2 diabetes mellitus without complications: Secondary | ICD-10-CM | POA: Diagnosis not present

## 2015-10-03 DIAGNOSIS — M79601 Pain in right arm: Secondary | ICD-10-CM

## 2015-10-03 LAB — GLUCOSE, POCT (MANUAL RESULT ENTRY): POC GLUCOSE: 149 mg/dL — AB (ref 70–99)

## 2015-10-03 LAB — POCT GLYCOSYLATED HEMOGLOBIN (HGB A1C): Hemoglobin A1C: 5.6

## 2015-10-03 MED ORDER — GLIMEPIRIDE 4 MG PO TABS: 4.0000 mg | ORAL_TABLET | Freq: Every day | ORAL | Status: DC

## 2015-10-03 MED ORDER — LOSARTAN POTASSIUM 100 MG PO TABS: 100.0000 mg | ORAL_TABLET | Freq: Every day | ORAL | Status: DC

## 2015-10-03 NOTE — Patient Instructions (Signed)

## 2015-10-03 NOTE — Progress Notes (Signed)
CC: Follow-up from hospitalization (09/24/15-09/25/15)  HPI: Caroline Cooper is a 80 y.o. female with a history of type 2 diabetes mellitus (A1c 5.9), hyperlipidemia, hypertension who presented to [facility] ED right-sided neck pain and right upper extremity pain and was found to have elevated cardiac enzymes-0.03, 0.07 and so she was admitted.  Repeat cardiac enzymes subsequently trended down to 0.033, she had an elevated d-dimer of 0.52. EKG revealed normal sinus rhythm with ? RVH. Other labs were unremarkable. Chest x-ray revealed no acute cardiopulmonary process; Lexiscan Myoview revealed no reversible ischemia or infarction, normal left ventricular wall motion, EF of 77% and no cardiac workup was needed as per cardiology recommendations. She was maintained on her home medication and subsequently discharged to follow-up outpatient.  Today she reports doing well and neck pain and right upper extremity pain have resolved. Her blood pressure is elevated and she endorses taking her antihypertensive today. She would like to be referred to an ophthalmologist.  Allergies  Allergen Reactions  . Penicillins     Unknown reaction  Has patient had a PCN reaction causing immediate rash, facial/tongue/throat swelling, SOB or lightheadedness with hypotension: No Has patient had a PCN reaction causing severe rash involving mucus membranes or skin necrosis: No Has patient had a PCN reaction that required hospitalization No Has patient had a PCN reaction occurring within the last 10 years: No If all of the above answers are "NO", then may proceed with Cephalosporin use.    Past Medical History  Diagnosis Date  . Diabetes mellitus without complication ([place])   . Hypertension   . HTN (hypertension) 09/24/2015  . DM (diabetes mellitus) ([place]) 09/24/2015  . Hyperlipidemia 09/24/2015  . Diverticulitis    Current Outpatient Prescriptions on File Prior to Visit  Medication Sig Dispense Refill  .  atorvastatin (LIPITOR) 40 MG tablet Take 40 mg by mouth daily.    . carvedilol (COREG) 12.5 MG tablet Take 12.5 mg by mouth 2 (two) times daily with a meal.    . ciprofloxacin (CIPRO) 500 MG tablet Take 1 tablet (500 mg total) by mouth 2 (two) times daily. 14 tablet 0  . glimepiride (AMARYL) 4 MG tablet Take 4 mg by mouth 2 (two) times daily.    Marland Kitchen ibuprofen (ADVIL,MOTRIN) 400 MG tablet Take 1 tablet (400 mg total) by mouth 3 (three) times daily. Take 3 times daily x 4 days then use as needed. 30 tablet 0  . losartan (COZAAR) 50 MG tablet Take 50 mg by mouth daily.    . metroNIDAZOLE (FLAGYL) 500 MG tablet Take 1 tablet (500 mg total) by mouth 2 (two) times daily. 14 tablet 0  . Multiple Vitamins-Minerals (CENTRUM ADULTS) TABS Take 1 tablet by mouth daily.    . pantoprazole (PROTONIX) 40 MG tablet Take 1 tablet (40 mg total) by mouth daily. 30 tablet 0   No current facility-administered medications on file prior to visit.   No family history on file. Social History   Social History  . Marital Status: Married    Spouse Name: N/A  . Number of Children: N/A  . Years of Education: N/A   Occupational History  . Not on file.   Social History Main Topics  . Smoking status: Never Smoker   . Smokeless tobacco: Not on file  . Alcohol Use: No  . Drug Use: No  . Sexual Activity: Not on file   Other Topics Concern  . Not on file   Social History Narrative    Review  of Systems: Constitutional: Negative for fever, chills, diaphoresis, activity change, appetite change and fatigue. HENT: Negative for ear pain, nosebleeds, congestion, facial swelling, rhinorrhea, neck pain, neck stiffness and ear discharge.  Eyes: Negative for pain, discharge, redness, itching and visual disturbance. Respiratory: Negative for cough, choking, chest tightness, shortness of breath, wheezing and stridor.  Cardiovascular: Negative for chest pain, palpitations and leg swelling. Gastrointestinal: Negative for  abdominal distention. Genitourinary: Negative for dysuria, urgency, frequency, hematuria, flank pain, decreased urine volume, difficulty urinating and dyspareunia.  Musculoskeletal: Negative for back pain, joint swelling, arthralgias and gait problem. Neurological: Negative for dizziness, tremors, seizures, syncope, facial asymmetry, speech difficulty, weakness, light-headedness, numbness and headaches.  Hematological: Negative for adenopathy. Does not bruise/bleed easily. Psychiatric/Behavioral: Negative for hallucinations, behavioral problems, confusion, dysphoric mood, decreased concentration and agitation.    Objective: Filed Vitals:   10/03/15 0923 10/03/15 0934  BP: 197/85 180/70  Pulse: 69   Temp: 97.6 F (36.4 C)   TempSrc: Oral   Resp: 18   Height: 5' 2.5" (1.588 m)   Weight: 154 lb (69.854 kg)   SpO2: 98%      Physical Exam: Constitutional: Patient appears well-developed and well-nourished. No distress. HENT: Normocephalic, atraumatic, External right and left ear normal. Oropharynx is clear and moist.  Eyes: Conjunctivae and EOM are normal. PERRLA, no scleral icterus. Neck: Normal ROM. Neck supple. No JVD. No tracheal deviation. No thyromegaly. CVS: RRR, S1/S2 +, no murmurs, no gallops, no carotid bruit.  Pulmonary: Effort and breath sounds normal, no stridor, rhonchi, wheezes, rales.  Abdominal: Soft. BS +,  no distension, tenderness, rebound or guarding.  Musculoskeletal: Normal range of motion. No edema and no tenderness.  Lymphadenopathy: No lymphadenopathy noted, cervical, inguinal or axillary Neuro: Alert. Normal reflexes, muscle tone coordination. No cranial nerve deficit. Skin: Skin is warm and dry. No rash noted. Not diaphoretic. No erythema. No pallor. Psychiatric: Normal mood and affect. Behavior, judgment, thought content normal.  Lab Results  Component Value Date   WBC 8.1 09/25/2015   HGB 11.5* 09/25/2015   HCT 34.9* 09/25/2015   MCV 93.6 09/25/2015    PLT 157 09/25/2015   Lab Results  Component Value Date   CREATININE 0.75 09/25/2015   BUN 15 09/25/2015   NA 141 09/25/2015   K 3.8 09/25/2015   CL 108 09/25/2015   CO2 25 09/25/2015    Lab Results  Component Value Date   HGBA1C 5.9* 09/24/2015   Lipid Panel     Component Value Date/Time   CHOL 129 09/25/2015 0606   TRIG 80 09/25/2015 0606   HDL 42 09/25/2015 0606   CHOLHDL 3.1 09/25/2015 0606   VLDL 16 09/25/2015 0606   LDLCALC 71 09/25/2015 0606       Assessment and plan:  Type 2 diabetes mellitus: Controlled with A1c of 5.9 Glimepiride reduced from 4 mg bid to 4mg  daily We will reassess blood sugar log with her next office visit.  Hypertension: Uncontrolled. Increase losartan from 50 mg to 100 mg. We will reassess her blood pressure at the next office visit.   Hyperlipidemia: LDL is 71 which is almost at goal of less than 70. Continue Lipitor.  Right upper extremity and right-sided neck pain: Resolved.       Arnoldo Morale, [place] [facility] [phone] 10/03/2015, 9:18 AM

## 2015-10-03 NOTE — Progress Notes (Signed)
Pt's here for hosiptal f/up for chest pain.  Pt moved here 4 months ago from [place] with her husband.   Pt states that she took her BP meds only, no other meds this morning.  Pt looking to est. care with PCP.

## 2015-10-04 LAB — MICROALBUMIN / CREATININE URINE RATIO
CREATININE, URINE: 72 mg/dL (ref 20–320)
MICROALB/CREAT RATIO: 18 ug/mg{creat} (ref <30)
Microalb, Ur: 1.3 mg/dL

## 2015-10-09 ENCOUNTER — Telehealth: Payer: Self-pay

## 2015-10-09 NOTE — Telephone Encounter (Signed)
[place] called [name] interpreter and spoke to Caroline Cooper [phone]. Pt was informed of appt with Opthalmologist and was told that a letter with details to her appt will be mailed out to her address on file.

## 2015-10-09 NOTE — Telephone Encounter (Signed)
[place] called [place] interpreter Alex # [phone]. Pt wasn't available to take my call. Interpreter called pt son, [name] and left a message for him to return my call asap.

## 2015-10-09 NOTE — Telephone Encounter (Signed)
-----   Message from Arnoldo Morale, MD sent at 10/05/2015  1:50 PM EDT ----- Please inform the patient that labs are normal. Thank you.

## 2015-10-12 NOTE — Telephone Encounter (Signed)
CMA called patient son. Son verified name and DOB. He was given his mother's lab results and agreed that he would rely the message to his Mom.

## 2015-10-24 DIAGNOSIS — Z961 Presence of intraocular lens: Secondary | ICD-10-CM | POA: Diagnosis not present

## 2015-10-24 DIAGNOSIS — H02834 Dermatochalasis of left upper eyelid: Secondary | ICD-10-CM | POA: Diagnosis not present

## 2015-10-24 DIAGNOSIS — H02831 Dermatochalasis of right upper eyelid: Secondary | ICD-10-CM | POA: Diagnosis not present

## 2015-10-24 DIAGNOSIS — H43813 Vitreous degeneration, bilateral: Secondary | ICD-10-CM | POA: Diagnosis not present

## 2015-10-24 DIAGNOSIS — H40013 Open angle with borderline findings, low risk, bilateral: Secondary | ICD-10-CM | POA: Diagnosis not present

## 2015-11-02 ENCOUNTER — Ambulatory Visit: Payer: Medicare Other | Attending: Family Medicine | Admitting: Family Medicine

## 2015-11-02 ENCOUNTER — Encounter: Payer: Self-pay | Admitting: Family Medicine

## 2015-11-02 VITALS — BP 169/90 | HR 64 | Temp 98.0°F | Resp 12 | Ht 62.5 in | Wt 157.0 lb

## 2015-11-02 DIAGNOSIS — I10 Essential (primary) hypertension: Secondary | ICD-10-CM | POA: Insufficient documentation

## 2015-11-02 DIAGNOSIS — E785 Hyperlipidemia, unspecified: Secondary | ICD-10-CM | POA: Diagnosis not present

## 2015-11-02 DIAGNOSIS — E119 Type 2 diabetes mellitus without complications: Secondary | ICD-10-CM | POA: Insufficient documentation

## 2015-11-02 LAB — GLUCOSE, POCT (MANUAL RESULT ENTRY): POC GLUCOSE: 172 mg/dL — AB (ref 70–99)

## 2015-11-02 MED ORDER — AMLODIPINE BESYLATE 10 MG PO TABS: 10.0000 mg | ORAL_TABLET | Freq: Every day | ORAL | Status: DC

## 2015-11-02 NOTE — Patient Instructions (Signed)
Hypertension Hypertension, commonly called high blood pressure, is when the force of blood pumping through your arteries is too strong. Your arteries are the blood vessels that carry blood from your heart throughout your body. A blood pressure reading consists of a higher number over a lower number, such as 110/72. The higher number (systolic) is the pressure inside your arteries when your heart pumps. The lower number (diastolic) is the pressure inside your arteries when your heart relaxes. Ideally you want your blood pressure below 120/80. Hypertension forces your heart to work harder to pump blood. Your arteries may become narrow or stiff. Having untreated or uncontrolled hypertension can cause heart attack, stroke, kidney disease, and other problems. RISK FACTORS Some risk factors for high blood pressure are controllable. Others are not.  Risk factors you cannot control include:   Race. You may be at higher risk if you are African American.  Age. Risk increases with age.  Gender. Men are at higher risk than women before age 45 years. After age 65, women are at higher risk than men. Risk factors you can control include:  Not getting enough exercise or physical activity.  Being overweight.  Getting too much fat, sugar, calories, or salt in your diet.  Drinking too much alcohol. SIGNS AND SYMPTOMS Hypertension does not usually cause signs or symptoms. Extremely high blood pressure (hypertensive crisis) may cause headache, anxiety, shortness of breath, and nosebleed. DIAGNOSIS To check if you have hypertension, your health care provider will measure your blood pressure while you are seated, with your arm held at the level of your heart. It should be measured at least twice using the same arm. Certain conditions can cause a difference in blood pressure between your right and left arms. A blood pressure reading that is higher than normal on one occasion does not mean that you need treatment. If  it is not clear whether you have high blood pressure, you may be asked to return on a different day to have your blood pressure checked again. Or, you may be asked to monitor your blood pressure at home for 1 or more weeks. TREATMENT Treating high blood pressure includes making lifestyle changes and possibly taking medicine. Living a healthy lifestyle can help lower high blood pressure. You may need to change some of your habits. Lifestyle changes may include:  Following the DASH diet. This diet is high in fruits, vegetables, and whole grains. It is low in salt, red meat, and added sugars.  Keep your sodium intake below 2,300 mg per day.  Getting at least 30-45 minutes of aerobic exercise at least 4 times per week.  Losing weight if necessary.  Not smoking.  Limiting alcoholic beverages.  Learning ways to reduce stress. Your health care provider may prescribe medicine if lifestyle changes are not enough to get your blood pressure under control, and if one of the following is true:  You are 18-59 years of age and your systolic blood pressure is above 140.  You are 60 years of age or older, and your systolic blood pressure is above 150.  Your diastolic blood pressure is above 90.  You have diabetes, and your systolic blood pressure is over 140 or your diastolic blood pressure is over 90.  You have kidney disease and your blood pressure is above 140/90.  You have heart disease and your blood pressure is above 140/90. Your personal target blood pressure may vary depending on your medical conditions, your age, and other factors. HOME CARE INSTRUCTIONS    Have your blood pressure rechecked as directed by your health care provider.   Take medicines only as directed by your health care provider. Follow the directions carefully. Blood pressure medicines must be taken as prescribed. The medicine does not work as well when you skip doses. Skipping doses also puts you at risk for  problems.  Do not smoke.   Monitor your blood pressure at home as directed by your health care provider. SEEK MEDICAL CARE IF:   You think you are having a reaction to medicines taken.  You have recurrent headaches or feel dizzy.  You have swelling in your ankles.  You have trouble with your vision. SEEK IMMEDIATE MEDICAL CARE IF:  You develop a severe headache or confusion.  You have unusual weakness, numbness, or feel faint.  You have severe chest or abdominal pain.  You vomit repeatedly.  You have trouble breathing. MAKE SURE YOU:   Understand these instructions.  Will watch your condition.  Will get help right away if you are not doing well or get worse.   This information is not intended to replace advice given to you by your health care provider. Make sure you discuss any questions you have with your health care provider.   Document Released: 11/18/2005 Document Revised: 04/04/2015 Document Reviewed: 09/10/2013 Elsevier Interactive Patient Education 2016 Elsevier Inc.  

## 2015-11-02 NOTE — Progress Notes (Signed)
[place] video interpreting used ID [id number] She is here to follow up on her DM2 and HTN She has been taking all medications She ate cheese toast 1 hour ago

## 2015-11-02 NOTE — Progress Notes (Signed)
Subjective:    Patient ID: Caroline Cooper, female    DOB: 18-Apr-1935, 80 y.o.   MRN: [id number]  HPI  80 year old female with a history of hypertension, type 2 diabetes mellitus (A1c 5.9), hyperlipidemia who comes into the clinic for follow-up visit.  At her last office visit blood pressure was severely elevated and losartan was increased from 50-100 mg; Glimepiride was also reduced from 4 mg twice daily to 4 mg daily to reduce the risk for hypoglycemia. Today she complains of occasional blurry vision which she said occurs only after she takes her medicine but denies any history of hypoglycemia (of note she is up-to-date with her annual eye exams and recently saw DR Katy Fitch of [facility]) She forgot to bring in her blood sugar log today but reports that random sugars have ranged 95-172.  Past Medical History  Diagnosis Date  . Diabetes mellitus without complication ([place])   . Hypertension   . HTN (hypertension) 09/24/2015  . DM (diabetes mellitus) ([place]) 09/24/2015  . Hyperlipidemia 09/24/2015  . Diverticulitis     Past Surgical History  Procedure Laterality Date  . Abdominal hysterectomy    . Cholecystectomy    . Hernia repair    . Colon surgery      COLECTOMY FOR DIVERTICULITIS    Social History   Social History  . Marital Status: Married    Spouse Name: N/A  . Number of Children: N/A  . Years of Education: N/A   Occupational History  . Not on file.   Social History Main Topics  . Smoking status: Never Smoker   . Smokeless tobacco: Not on file  . Alcohol Use: No  . Drug Use: No  . Sexual Activity: Not on file   Other Topics Concern  . Not on file   Social History Narrative    Allergies  Allergen Reactions  . Penicillins     Unknown reaction  Has patient had a PCN reaction causing immediate rash, facial/tongue/throat swelling, SOB or lightheadedness with hypotension: No Has patient had a PCN reaction causing severe rash involving mucus membranes or skin  necrosis: No Has patient had a PCN reaction that required hospitalization No Has patient had a PCN reaction occurring within the last 10 years: No If all of the above answers are "NO", then may proceed with Cephalosporin use.       Review of Systems  Constitutional: Negative for activity change, appetite change and fatigue.  HENT: Negative for congestion, sinus pressure and sore throat.   Eyes: Positive for visual disturbance (Occasional blurry vision).  Respiratory: Negative for cough, chest tightness, shortness of breath and wheezing.   Cardiovascular: Negative for chest pain and palpitations.  Gastrointestinal: Negative for abdominal pain, constipation and abdominal distention.  Endocrine: Negative for polydipsia.  Genitourinary: Negative for dysuria and frequency.  Musculoskeletal: Negative for back pain and arthralgias.  Skin: Negative for rash.  Neurological: Negative for tremors, light-headedness and numbness.  Hematological: Does not bruise/bleed easily.  Psychiatric/Behavioral: Negative for behavioral problems and agitation.       Objective: Filed Vitals:   11/02/15 0917  BP: 169/90  Pulse: 64  Temp: 98 F (36.7 C)  Resp: 12  Height: 5' 2.5" (1.588 m)  Weight: 157 lb (71.215 kg)  SpO2: 97%      Physical Exam  Constitutional: She is oriented to person, place, and time. She appears well-developed and well-nourished.  Cardiovascular: Normal rate, normal heart sounds and intact distal pulses.   No murmur heard.  Pulmonary/Chest: Effort normal and breath sounds normal. She has no wheezes. She has no rales. She exhibits no tenderness.  Abdominal: Soft. Bowel sounds are normal. She exhibits no distension and no mass. There is no tenderness.  Musculoskeletal: Normal range of motion.  Neurological: She is alert and oriented to person, place, and time.          Assessment & Plan:  Type 2 diabetes mellitus: Controlled with A1c of 5.9 Continue glimepiride 4 mg  daily; unsure if blurry vision is secondary to this but she has been educated about hypoglycemia symptoms and no stenotic high the clinic in the event of hypoglycemia. Up-to-date on eye exam. We will reassess blood sugar log with her next office visit.  Hypertension: Uncontrolled. Continue losartan 100 mg. Amlodipine added to regimen. We will reassess her blood pressure at the next office visit.   Hyperlipidemia: LDL is 71 which is almost at goal of less than 70. Continue Lipitor.  This note has been created with Surveyor, quantity. Any transcriptional errors are unintentional.

## 2015-11-07 DIAGNOSIS — H02834 Dermatochalasis of left upper eyelid: Secondary | ICD-10-CM | POA: Diagnosis not present

## 2015-11-07 DIAGNOSIS — Z961 Presence of intraocular lens: Secondary | ICD-10-CM | POA: Diagnosis not present

## 2015-11-07 DIAGNOSIS — H02831 Dermatochalasis of right upper eyelid: Secondary | ICD-10-CM | POA: Diagnosis not present

## 2015-11-07 DIAGNOSIS — H40013 Open angle with borderline findings, low risk, bilateral: Secondary | ICD-10-CM | POA: Diagnosis not present

## 2015-12-26 ENCOUNTER — Ambulatory Visit: Payer: Medicare Other | Admitting: Family Medicine

## 2015-12-29 ENCOUNTER — Encounter: Payer: Self-pay | Admitting: Family Medicine

## 2015-12-29 ENCOUNTER — Ambulatory Visit: Payer: Medicare Other | Attending: Family Medicine | Admitting: Family Medicine

## 2015-12-29 VITALS — BP 167/51 | HR 95 | Temp 96.0°F | Resp 15 | Ht 62.5 in | Wt 155.8 lb

## 2015-12-29 DIAGNOSIS — Z880 Allergy status to penicillin: Secondary | ICD-10-CM | POA: Diagnosis not present

## 2015-12-29 DIAGNOSIS — E119 Type 2 diabetes mellitus without complications: Secondary | ICD-10-CM | POA: Insufficient documentation

## 2015-12-29 DIAGNOSIS — I10 Essential (primary) hypertension: Secondary | ICD-10-CM

## 2015-12-29 DIAGNOSIS — E118 Type 2 diabetes mellitus with unspecified complications: Secondary | ICD-10-CM | POA: Diagnosis not present

## 2015-12-29 DIAGNOSIS — Z79899 Other long term (current) drug therapy: Secondary | ICD-10-CM | POA: Insufficient documentation

## 2015-12-29 DIAGNOSIS — E785 Hyperlipidemia, unspecified: Secondary | ICD-10-CM | POA: Diagnosis not present

## 2015-12-29 DIAGNOSIS — H538 Other visual disturbances: Secondary | ICD-10-CM

## 2015-12-29 LAB — POCT GLYCOSYLATED HEMOGLOBIN (HGB A1C): Hemoglobin A1C: 5.9

## 2015-12-29 LAB — GLUCOSE, POCT (MANUAL RESULT ENTRY): POC Glucose: 140 mg/dl — AB (ref 70–99)

## 2015-12-29 NOTE — Progress Notes (Signed)
CC: Follow-up on hypertension  HPI: Caroline Cooper is a 81 y.o. female with a history of type 2 diabetes mellitus (A1c 5.9), hypertension, hyperlipidemia who is accompanied by her son today and comes in for follow-up of her blood pressure. Her blood pressure remains elevated despite compliance with current NSAIDs.  She also complains of persisting blurry vision which she had also complained of at her last office visit; she thinks this is related to her glipizide as her vision is normal off until she takes her medications. I have pointed out to her that she had been on glipizide prior to getting established in this clinic that she informs me that blurry vision only started in the last 6 weeks and has worsened. She saw her ophthalmologist Dr.Groat a few weeks ago.  Patient has No headache, No chest pain, No abdominal pain - No Nausea, No new weakness tingling or numbness, No Cough - SOB.  Allergies  Allergen Reactions  . Penicillins     Unknown reaction  Has patient had a PCN reaction causing immediate rash, facial/tongue/throat swelling, SOB or lightheadedness with hypotension: No Has patient had a PCN reaction causing severe rash involving mucus membranes or skin necrosis: No Has patient had a PCN reaction that required hospitalization No Has patient had a PCN reaction occurring within the last 10 years: No If all of the above answers are "NO", then may proceed with Cephalosporin use.    Past Medical History  Diagnosis Date  . Diabetes mellitus without complication ([place])   . Hypertension   . HTN (hypertension) 09/24/2015  . DM (diabetes mellitus) ([place]) 09/24/2015  . Hyperlipidemia 09/24/2015  . Diverticulitis    Current Outpatient Prescriptions on File Prior to Visit  Medication Sig Dispense Refill  . amLODipine (NORVASC) 10 MG tablet Take 1 tablet (10 mg total) by mouth daily. 30 tablet 3  . atorvastatin (LIPITOR) 40 MG tablet Take 40 mg by mouth daily.    . carvedilol (COREG)  12.5 MG tablet Take 12.5 mg by mouth 2 (two) times daily with a meal.    . glimepiride (AMARYL) 4 MG tablet Take 1 tablet (4 mg total) by mouth daily with breakfast. 30 tablet 2  . ibuprofen (ADVIL,MOTRIN) 400 MG tablet Take 1 tablet (400 mg total) by mouth 3 (three) times daily. Take 3 times daily x 4 days then use as needed. 30 tablet 0  . losartan (COZAAR) 100 MG tablet Take 1 tablet (100 mg total) by mouth daily. 30 tablet 3  . Multiple Vitamins-Minerals (CENTRUM ADULTS) TABS Take 1 tablet by mouth daily.    . pantoprazole (PROTONIX) 40 MG tablet Take 1 tablet (40 mg total) by mouth daily. 30 tablet 0   No current facility-administered medications on file prior to visit.   History reviewed. No pertinent family history. Social History   Social History  . Marital Status: Married    Spouse Name: N/A  . Number of Children: N/A  . Years of Education: N/A   Occupational History  . Not on file.   Social History Main Topics  . Smoking status: Never Smoker   . Smokeless tobacco: Not on file  . Alcohol Use: No  . Drug Use: No  . Sexual Activity: Not on file   Other Topics Concern  . Not on file   Social History Narrative    Review of Systems: Constitutional: Negative for fever, chills, diaphoresis, activity change, appetite change and fatigue. HENT: Negative for ear pain, nosebleeds, congestion, facial swelling, rhinorrhea,  neck pain, neck stiffness and ear discharge.  Eyes: Positive for blurry vision. Respiratory: Negative for cough, choking, chest tightness, shortness of breath, wheezing and stridor.  Cardiovascular: Negative for chest pain, palpitations and leg swelling. Gastrointestinal: Negative for abdominal distention. Genitourinary: Negative for dysuria, urgency, frequency, hematuria, flank pain, decreased urine volume, difficulty urinating and dyspareunia.  Musculoskeletal: Negative for back pain, joint swelling, arthralgias and gait problem. Neurological: Negative for  dizziness, tremors, seizures, syncope, facial asymmetry, speech difficulty, weakness, light-headedness, numbness and headaches.  Hematological: Negative for adenopathy. Does not bruise/bleed easily. Psychiatric/Behavioral: Negative for hallucinations, behavioral problems, confusion, dysphoric mood, decreased concentration and agitation.    Objective:   Filed Vitals:   12/29/15 1047  BP: 167/51  Pulse: 95  Temp: 96 F (35.6 C)  Resp: 15    Physical Exam: Constitutional: Patient appears well-developed and well-nourished. No distress. HENT: Normocephalic, atraumatic, External right and left ear normal. Oropharynx is clear and moist.  Eyes: Conjunctivae and EOM are normal. PERRLA, no scleral icterus, no cataracts Neck: Normal ROM. Neck supple. No JVD. No tracheal deviation. No thyromegaly. CVS: RRR, S1/S2 +, no murmurs, no gallops, no carotid bruit.  Pulmonary: Effort and breath sounds normal, no stridor, rhonchi, wheezes, rales.  Abdominal: Soft. BS +,  no distension, tenderness, rebound or guarding.  Musculoskeletal: Normal range of motion. No edema and no tenderness.  Lymphadenopathy: No lymphadenopathy noted, cervical, inguinal or axillary Neuro: Alert. Normal reflexes, muscle tone coordination. No cranial nerve deficit. Skin: Skin is warm and dry. No rash noted. Not diaphoretic. No erythema. No pallor. Psychiatric: Normal mood and affect. Behavior, judgment, thought content normal.  Lab Results  Component Value Date   WBC 8.1 09/25/2015   HGB 11.5* 09/25/2015   HCT 34.9* 09/25/2015   MCV 93.6 09/25/2015   PLT 157 09/25/2015   Lab Results  Component Value Date   CREATININE 0.75 09/25/2015   BUN 15 09/25/2015   NA 141 09/25/2015   K 3.8 09/25/2015   CL 108 09/25/2015   CO2 25 09/25/2015    Lab Results  Component Value Date   HGBA1C 5.90 12/29/2015   Lipid Panel     Component Value Date/Time   CHOL 129 09/25/2015 0606   TRIG 80 09/25/2015 0606   HDL 42  09/25/2015 0606   CHOLHDL 3.1 09/25/2015 0606   VLDL 16 09/25/2015 0606   LDLCALC 71 09/25/2015 0606       Assessment and plan:  Type 2 diabetes mellitus: Controlled with A1c of 5.9 Continue glimepiride 4 mg daily Up-to-date on eye exam.  Blurry vision: She is so certain that this is from one of her medications and so we will work by an elimination. I have advised her to hold off on all medications on day 1 and then introduce a medications 1 after the other on subsequent days. We will discuss at her next office visit which of the medications she attributes to blurry vision 2 and that will be substituted.  Hypertension: Uncontrolled. Currently on losartan, amlodipine and carvedilol. Due to current blurry vision which she thinks is a side effect of one of her meds I will hold off on making regimen changes until her next visit. Meanwhile continue low-sodium, DASH diet   Hyperlipidemia: LDL is 71 which is almost at goal of less than 70. Continue Lipitor.  Arnoldo Morale, [facility] [phone] 12/29/2015, 11:16 AM

## 2015-12-29 NOTE — Patient Instructions (Signed)
Diabetes Mellitus and Food It is important for you to manage your blood sugar (glucose) level. Your blood glucose level can be greatly affected by what you eat. Eating healthier foods in the appropriate amounts throughout the day at about the same time each day will help you control your blood glucose level. It can also help slow or prevent worsening of your diabetes mellitus. Healthy eating may even help you improve the level of your blood pressure and reach or maintain a healthy weight.  General recommendations for healthful eating and cooking habits include:  Eating meals and snacks regularly. Avoid going long periods of time without eating to lose weight.  Eating a diet that consists mainly of plant-based foods, such as fruits, vegetables, nuts, legumes, and whole grains.  Using low-heat cooking methods, such as baking, instead of high-heat cooking methods, such as deep frying. Work with your dietitian to make sure you understand how to use the Nutrition Facts information on food labels. HOW CAN FOOD AFFECT ME? Carbohydrates Carbohydrates affect your blood glucose level more than any other type of food. Your dietitian will help you determine how many carbohydrates to eat at each meal and teach you how to count carbohydrates. Counting carbohydrates is important to keep your blood glucose at a healthy level, especially if you are using insulin or taking certain medicines for diabetes mellitus. Alcohol Alcohol can cause sudden decreases in blood glucose (hypoglycemia), especially if you use insulin or take certain medicines for diabetes mellitus. Hypoglycemia can be a life-threatening condition. Symptoms of hypoglycemia (sleepiness, dizziness, and disorientation) are similar to symptoms of having too much alcohol.  If your health care provider has given you approval to drink alcohol, do so in moderation and use the following guidelines:  Women should not have more than one drink per day, and men  should not have more than two drinks per day. One drink is equal to:  12 oz of beer.  5 oz of wine.  1 oz of hard liquor.  Do not drink on an empty stomach.  Keep yourself hydrated. Have water, diet soda, or unsweetened iced tea.  Regular soda, juice, and other mixers might contain a lot of carbohydrates and should be counted. WHAT FOODS ARE NOT RECOMMENDED? As you make food choices, it is important to remember that all foods are not the same. Some foods have fewer nutrients per serving than other foods, even though they might have the same number of calories or carbohydrates. It is difficult to get your body what it needs when you eat foods with fewer nutrients. Examples of foods that you should avoid that are high in calories and carbohydrates but low in nutrients include:  Trans fats (most processed foods list trans fats on the Nutrition Facts label).  Regular soda.  Juice.  Candy.  Sweets, such as cake, pie, doughnuts, and cookies.  Fried foods. WHAT FOODS CAN I EAT? Eat nutrient-rich foods, which will nourish your body and keep you healthy. The food you should eat also will depend on several factors, including:  The calories you need.  The medicines you take.  Your weight.  Your blood glucose level.  Your blood pressure level.  Your cholesterol level. You should eat a variety of foods, including:  Protein.  Lean cuts of meat.  Proteins low in saturated fats, such as fish, egg whites, and beans. Avoid processed meats.  Fruits and vegetables.  Fruits and vegetables that may help control blood glucose levels, such as apples, mangoes, and   yams.  Dairy products.  Choose fat-free or low-fat dairy products, such as milk, yogurt, and cheese.  Grains, bread, pasta, and rice.  Choose whole grain products, such as multigrain bread, whole oats, and brown rice. These foods may help control blood pressure.  Fats.  Foods containing healthful fats, such as nuts,  avocado, olive oil, canola oil, and fish. DOES EVERYONE WITH DIABETES MELLITUS HAVE THE SAME MEAL PLAN? Because every person with diabetes mellitus is different, there is not one meal plan that works for everyone. It is very important that you meet with a dietitian who will help you create a meal plan that is just right for you.   This information is not intended to replace advice given to you by your health care provider. Make sure you discuss any questions you have with your health care provider.   Document Released: 08/15/2005 Document Revised: 12/09/2014 Document Reviewed: 10/15/2013 Elsevier Interactive Patient Education 2016 [employer]  

## 2015-12-29 NOTE — Progress Notes (Signed)
Patient here for follow up on DM and HTN She reports intermittent blurry vision she thinks due to the glipizide She is concerned about her blood pressure being elevated

## 2016-01-10 ENCOUNTER — Ambulatory Visit: Payer: Medicare Other | Admitting: Family Medicine

## 2016-01-19 ENCOUNTER — Encounter: Payer: Self-pay | Admitting: Family Medicine

## 2016-01-19 ENCOUNTER — Ambulatory Visit: Payer: Medicare Other | Attending: Family Medicine | Admitting: Family Medicine

## 2016-01-19 VITALS — BP 166/84 | HR 68 | Temp 98.1°F | Resp 15 | Ht 62.5 in | Wt 155.8 lb

## 2016-01-19 DIAGNOSIS — E119 Type 2 diabetes mellitus without complications: Secondary | ICD-10-CM | POA: Diagnosis not present

## 2016-01-19 DIAGNOSIS — H538 Other visual disturbances: Secondary | ICD-10-CM | POA: Diagnosis not present

## 2016-01-19 DIAGNOSIS — I10 Essential (primary) hypertension: Secondary | ICD-10-CM

## 2016-01-19 LAB — GLUCOSE, POCT (MANUAL RESULT ENTRY): POC Glucose: 97 mg/dl (ref 70–99)

## 2016-01-19 MED ORDER — GLIPIZIDE 5 MG PO TABS: 2.5000 mg | ORAL_TABLET | Freq: Two times a day (BID) | ORAL | Status: DC

## 2016-01-19 NOTE — Progress Notes (Signed)
Reports blurry vision still L>R Patient and son feel it is the glimepiride Patient has only taken one of her HTN meds this am because she has been out of the others for 2 days -she knows to pick up

## 2016-01-19 NOTE — Progress Notes (Signed)
Subjective:  Patient ID: Caroline Cooper, female    DOB: [birth year]  Age: 81 y.o. MRN: [id number]  CC: Follow-up   HPI Caroline Cooper  is a 81 year old female with a history of type 2 diabetes mellitus (A1c 5.9) hypertension who Cooper into the clinic for follow-up on diabetes mellitus.  She has complained of blurry vision for the last couple weeks which she attributed to one of her medications and at her last visit the plan was to work on an elimination and she has come to the conclusion that this is secondary to her glimepiride. Blurry vision is worse in the left eye (in which she has had a previous intraocular lens implant).  She is accompanied by her son today for the follow-up and reports only taking one of her antihypertensives as she ran out of the other 2 hence her elevated blood pressure.  Outpatient Prescriptions Prior to Visit  Medication Sig Dispense Refill  . amLODipine (NORVASC) 10 MG tablet Take 1 tablet (10 mg total) by mouth daily. 30 tablet 3  . atorvastatin (LIPITOR) 40 MG tablet Take 40 mg by mouth daily.    . carvedilol (COREG) 12.5 MG tablet Take 12.5 mg by mouth 2 (two) times daily with a meal.    . glimepiride (AMARYL) 4 MG tablet Take 1 tablet (4 mg total) by mouth daily with breakfast. 30 tablet 2  . ibuprofen (ADVIL,MOTRIN) 400 MG tablet Take 1 tablet (400 mg total) by mouth 3 (three) times daily. Take 3 times daily x 4 days then use as needed. 30 tablet 0  . losartan (COZAAR) 100 MG tablet Take 1 tablet (100 mg total) by mouth daily. 30 tablet 3  . Multiple Vitamins-Minerals (CENTRUM ADULTS) TABS Take 1 tablet by mouth daily.    . pantoprazole (PROTONIX) 40 MG tablet Take 1 tablet (40 mg total) by mouth daily. 30 tablet 0   No facility-administered medications prior to visit.    ROS Review of Systems  Constitutional: Negative for activity change and appetite change.  HENT: Negative for sinus pressure and sore throat.   Eyes: Positive for visual disturbance.    Respiratory: Negative for chest tightness, shortness of breath and wheezing.   Cardiovascular: Negative for chest pain and palpitations.  Gastrointestinal: Negative for abdominal pain, constipation and abdominal distention.  Genitourinary: Negative.   Musculoskeletal: Negative.   Psychiatric/Behavioral: Negative for behavioral problems and dysphoric mood.    Objective:  BP 166/84 mmHg  Pulse 68  Temp(Src) 98.1 F (36.7 C)  Resp 15  Ht 5' 2.5" (1.588 m)  Wt 155 lb 12.8 oz (70.67 kg)  BMI 28.02 kg/m2  SpO2 97%  BP/Weight 01/19/2016 12/29/2015 [id number]  Systolic BP [id number]  Diastolic BP 84 51 90  Wt. (Lbs) 155.8 155.8 157  BMI 28.02 28.02 28.24      Physical Exam  Constitutional: She is oriented to person, place, and time. She appears well-developed and well-nourished.  Cardiovascular: Normal rate, normal heart sounds and intact distal pulses.   No murmur heard. Pulmonary/Chest: Effort normal and breath sounds normal. She has no wheezes. She has no rales. She exhibits no tenderness.  Abdominal: Soft. Bowel sounds are normal. She exhibits no distension and no mass. There is no tenderness.  Musculoskeletal: Normal range of motion.  Neurological: She is alert and oriented to person, place, and time.     Assessment & Plan:   1. Type 2 diabetes mellitus without complication, without long-term current use of insulin ([facility]) Controlled with A1c  of 5.9. Switched from glimepiride to glipizide due to complains of blurry vision We'll review blood sugar log at next visit - Glucose (CBG)  2. Blurry vision Patient thinks this is secondary to glimepiride. I have advised her to take all her medications for 1 week after which she is to commence the glipizide to assess if blurry vision will also result from initiating glipizide.  3. Essential hypertension Uncontrolled due to not taking 2 of her antihypertensives. Advised to noncompliance, low-sodium, DASH diet. We'll reassess  blood pressure at next visit.   No orders of the defined types were placed in this encounter.    Follow-up: Return in about 6 weeks (around 03/01/2016) for follow up on diabetes mellitus and hypertension.   Arnoldo Morale MD

## 2016-01-19 NOTE — Patient Instructions (Signed)
Diabetes Mellitus and Food It is important for you to manage your blood sugar (glucose) level. Your blood glucose level can be greatly affected by what you eat. Eating healthier foods in the appropriate amounts throughout the day at about the same time each day will help you control your blood glucose level. It can also help slow or prevent worsening of your diabetes mellitus. Healthy eating may even help you improve the level of your blood pressure and reach or maintain a healthy weight.  General recommendations for healthful eating and cooking habits include:  Eating meals and snacks regularly. Avoid going long periods of time without eating to lose weight.  Eating a diet that consists mainly of plant-based foods, such as fruits, vegetables, nuts, legumes, and whole grains.  Using low-heat cooking methods, such as baking, instead of high-heat cooking methods, such as deep frying. Work with your dietitian to make sure you understand how to use the Nutrition Facts information on food labels. HOW CAN FOOD AFFECT ME? Carbohydrates Carbohydrates affect your blood glucose level more than any other type of food. Your dietitian will help you determine how many carbohydrates to eat at each meal and teach you how to count carbohydrates. Counting carbohydrates is important to keep your blood glucose at a healthy level, especially if you are using insulin or taking certain medicines for diabetes mellitus. Alcohol Alcohol can cause sudden decreases in blood glucose (hypoglycemia), especially if you use insulin or take certain medicines for diabetes mellitus. Hypoglycemia can be a life-threatening condition. Symptoms of hypoglycemia (sleepiness, dizziness, and disorientation) are similar to symptoms of having too much alcohol.  If your health care provider has given you approval to drink alcohol, do so in moderation and use the following guidelines:  Women should not have more than one drink per day, and men  should not have more than two drinks per day. One drink is equal to:  12 oz of beer.  5 oz of wine.  1 oz of hard liquor.  Do not drink on an empty stomach.  Keep yourself hydrated. Have water, diet soda, or unsweetened iced tea.  Regular soda, juice, and other mixers might contain a lot of carbohydrates and should be counted. WHAT FOODS ARE NOT RECOMMENDED? As you make food choices, it is important to remember that all foods are not the same. Some foods have fewer nutrients per serving than other foods, even though they might have the same number of calories or carbohydrates. It is difficult to get your body what it needs when you eat foods with fewer nutrients. Examples of foods that you should avoid that are high in calories and carbohydrates but low in nutrients include:  Trans fats (most processed foods list trans fats on the Nutrition Facts label).  Regular soda.  Juice.  Candy.  Sweets, such as cake, pie, doughnuts, and cookies.  Fried foods. WHAT FOODS CAN I EAT? Eat nutrient-rich foods, which will nourish your body and keep you healthy. The food you should eat also will depend on several factors, including:  The calories you need.  The medicines you take.  Your weight.  Your blood glucose level.  Your blood pressure level.  Your cholesterol level. You should eat a variety of foods, including:  Protein.  Lean cuts of meat.  Proteins low in saturated fats, such as fish, egg whites, and beans. Avoid processed meats.  Fruits and vegetables.  Fruits and vegetables that may help control blood glucose levels, such as apples, mangoes, and   yams.  Dairy products.  Choose fat-free or low-fat dairy products, such as milk, yogurt, and cheese.  Grains, bread, pasta, and rice.  Choose whole grain products, such as multigrain bread, whole oats, and brown rice. These foods may help control blood pressure.  Fats.  Foods containing healthful fats, such as nuts,  avocado, olive oil, canola oil, and fish. DOES EVERYONE WITH DIABETES MELLITUS HAVE THE SAME MEAL PLAN? Because every person with diabetes mellitus is different, there is not one meal plan that works for everyone. It is very important that you meet with a dietitian who will help you create a meal plan that is just right for you.   This information is not intended to replace advice given to you by your health care provider. Make sure you discuss any questions you have with your health care provider.   Document Released: 08/15/2005 Document Revised: 12/09/2014 Document Reviewed: 10/15/2013 Elsevier Interactive Patient Education 2016 [employer]  

## 2016-02-09 ENCOUNTER — Telehealth: Payer: Self-pay | Admitting: Family Medicine

## 2016-02-09 MED ORDER — LOSARTAN POTASSIUM 100 MG PO TABS: 100.0000 mg | ORAL_TABLET | Freq: Every day | ORAL | Status: DC

## 2016-02-09 NOTE — Telephone Encounter (Signed)
Patient called requesting a medication losartan  please follow up.

## 2016-02-09 NOTE — Telephone Encounter (Signed)
Refill completed.

## 2016-02-28 ENCOUNTER — Ambulatory Visit: Payer: Medicare Other | Admitting: Family Medicine

## 2016-03-13 DIAGNOSIS — H02834 Dermatochalasis of left upper eyelid: Secondary | ICD-10-CM | POA: Diagnosis not present

## 2016-03-13 DIAGNOSIS — H10413 Chronic giant papillary conjunctivitis, bilateral: Secondary | ICD-10-CM | POA: Diagnosis not present

## 2016-03-13 DIAGNOSIS — H40013 Open angle with borderline findings, low risk, bilateral: Secondary | ICD-10-CM | POA: Diagnosis not present

## 2016-03-13 DIAGNOSIS — Z961 Presence of intraocular lens: Secondary | ICD-10-CM | POA: Diagnosis not present

## 2016-03-13 DIAGNOSIS — H401122 Primary open-angle glaucoma, left eye, moderate stage: Secondary | ICD-10-CM | POA: Diagnosis not present

## 2016-03-13 DIAGNOSIS — H43813 Vitreous degeneration, bilateral: Secondary | ICD-10-CM | POA: Diagnosis not present

## 2016-03-13 DIAGNOSIS — H02831 Dermatochalasis of right upper eyelid: Secondary | ICD-10-CM | POA: Diagnosis not present

## 2016-03-14 ENCOUNTER — Encounter: Payer: Self-pay | Admitting: Family Medicine

## 2016-03-14 ENCOUNTER — Ambulatory Visit: Payer: Medicare Other | Attending: Family Medicine | Admitting: Family Medicine

## 2016-03-14 VITALS — BP 145/80 | HR 84 | Temp 98.2°F | Resp 18 | Ht 61.5 in | Wt 157.4 lb

## 2016-03-14 DIAGNOSIS — I10 Essential (primary) hypertension: Secondary | ICD-10-CM | POA: Diagnosis not present

## 2016-03-14 DIAGNOSIS — H538 Other visual disturbances: Secondary | ICD-10-CM | POA: Diagnosis not present

## 2016-03-14 DIAGNOSIS — J302 Other seasonal allergic rhinitis: Secondary | ICD-10-CM | POA: Diagnosis not present

## 2016-03-14 DIAGNOSIS — E785 Hyperlipidemia, unspecified: Secondary | ICD-10-CM | POA: Insufficient documentation

## 2016-03-14 DIAGNOSIS — E119 Type 2 diabetes mellitus without complications: Secondary | ICD-10-CM | POA: Insufficient documentation

## 2016-03-14 DIAGNOSIS — H409 Unspecified glaucoma: Secondary | ICD-10-CM | POA: Insufficient documentation

## 2016-03-14 LAB — GLUCOSE, POCT (MANUAL RESULT ENTRY): POC Glucose: 122 mg/dl — AB (ref 70–99)

## 2016-03-14 LAB — COMPLETE METABOLIC PANEL WITH GFR
ALT: 12 U/L (ref 6–29)
AST: 16 U/L (ref 10–35)
Albumin: 4.6 g/dL (ref 3.6–5.1)
Alkaline Phosphatase: 93 U/L (ref 33–130)
BUN: 16 mg/dL (ref 7–25)
CO2: 28 mmol/L (ref 20–31)
Calcium: 9.5 mg/dL (ref 8.6–10.4)
Chloride: 103 mmol/L (ref 98–110)
Creat: 0.81 mg/dL (ref 0.60–0.93)
GFR, EST NON AFRICAN AMERICAN: 70 mL/min (ref >=60)
GFR, Est African American: 80 mL/min (ref >=60)
GLUCOSE: 81 mg/dL (ref 65–99)
POTASSIUM: 3.9 mmol/L (ref 3.5–5.3)
SODIUM: 141 mmol/L (ref 135–146)
Total Bilirubin: 0.8 mg/dL (ref 0.2–1.2)
Total Protein: 7.7 g/dL (ref 6.1–8.1)

## 2016-03-14 LAB — POCT GLYCOSYLATED HEMOGLOBIN (HGB A1C): Hemoglobin A1C: 5.1

## 2016-03-14 MED ORDER — OLOPATADINE HCL 0.1 % OP SOLN: 1.0000 [drp] | Freq: Two times a day (BID) | OPHTHALMIC | Status: DC

## 2016-03-14 MED ORDER — ATORVASTATIN CALCIUM 40 MG PO TABS: 40.0000 mg | ORAL_TABLET | Freq: Every day | ORAL | Status: DC

## 2016-03-14 MED ORDER — LOSARTAN POTASSIUM 100 MG PO TABS: 100.0000 mg | ORAL_TABLET | Freq: Every day | ORAL | Status: DC

## 2016-03-14 MED ORDER — FLUTICASONE PROPIONATE 50 MCG/ACT NA SUSP: 1.0000 | Freq: Every day | NASAL | Status: DC

## 2016-03-14 MED ORDER — CARVEDILOL 12.5 MG PO TABS: 12.5000 mg | ORAL_TABLET | Freq: Two times a day (BID) | ORAL | Status: DC

## 2016-03-14 MED ORDER — AMLODIPINE BESYLATE 10 MG PO TABS: 10.0000 mg | ORAL_TABLET | Freq: Every day | ORAL | Status: DC

## 2016-03-14 NOTE — Progress Notes (Signed)
Subjective:  Patient ID: Caroline Cooper, female    DOB: [birth year]  Age: 81 y.o. MRN: [id number]  CC: Diabetes and Hypertension   HPI Caroline Cooper is a 81 year old female with history of type 2 diabetes mellitus (A1c 5.1), hypertension who comes into the clinic for a follow-up visit. She had complained of blurry vision which she thought was secondary to glimepiride and this was changed to glipizide at her last office visit. She reports improvement in symptoms but still has some persisting blurry vision. 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 She has been compliant with a diabetic diet and does not exercise outside of the home but states she is active with home keeping.  She has been compliant with her antihypertensives and statin.  Today she complains of scratchy throat, rhinorrhea, history of present illness is, teary eyes and cough which started a few weeks ago. She denies sinus congestion of sinus tenderness and has no headaches or fever.    Outpatient Prescriptions Prior to Visit  Medication Sig Dispense Refill  . ibuprofen (ADVIL,MOTRIN) 400 MG tablet Take 1 tablet (400 mg total) by mouth 3 (three) times daily. Take 3 times daily x 4 days then use as needed. 30 tablet 0  . Multiple Vitamins-Minerals (CENTRUM ADULTS) TABS Take 1 tablet by mouth daily.    . carvedilol (COREG) 12.5 MG tablet Take 12.5 mg by mouth 2 (two) times daily with a meal.    . glipiZIDE (GLUCOTROL) 5 MG tablet Take 0.5 tablets (2.5 mg total) by mouth 2 (two) times daily before a meal. 30 tablet 3  . losartan (COZAAR) 100 MG tablet Take 1 tablet (100 mg total) by mouth daily. 30 tablet 3  . pantoprazole (PROTONIX) 40 MG tablet Take 1 tablet (40 mg total) by mouth daily. (Patient not taking: Reported on 03/14/2016) 30 tablet 0  . amLODipine (NORVASC) 10 MG tablet Take 1 tablet (10 mg total) by mouth daily. 30 tablet 3  . atorvastatin (LIPITOR) 40 MG tablet  Take 40 mg by mouth daily. Reported on 03/14/2016     No facility-administered medications prior to visit.    ROS Review of Systems  Constitutional: Negative for activity change, appetite change and fatigue.  HENT: Negative for congestion, sinus pressure and sore throat.   Eyes: Negative for visual disturbance.  Respiratory: Negative for cough, chest tightness, shortness of breath and wheezing.   Cardiovascular: Negative for chest pain and palpitations.  Gastrointestinal: Negative for abdominal pain, constipation and abdominal distention.  Endocrine: Negative for polydipsia.  Genitourinary: Negative for dysuria and frequency.  Musculoskeletal: Negative for back pain and arthralgias.  Skin: Negative for rash.  Neurological: Negative for tremors, light-headedness and numbness.  Hematological: Does not bruise/bleed easily.  Psychiatric/Behavioral: Negative for behavioral problems and agitation.    Objective:  BP 145/80 mmHg  Pulse 84  Temp(Src) 98.2 F (36.8 C) (Oral)  Resp 18  Ht 5' 1.5" (1.562 m)  Wt 157 lb 6.4 oz (71.396 kg)  BMI 29.26 kg/m2  SpO2 98%  BP/Weight 03/14/2016 01/19/2016 [id number]  Systolic BP [id number]  Diastolic BP 80 84 51  Wt. (Lbs) 157.4 155.8 155.8  BMI 29.26 28.02 28.02      Physical Exam  Constitutional: She is oriented to person, place, and time. She appears well-developed and well-nourished.  Cardiovascular: Normal rate, normal heart sounds and intact distal pulses.   No murmur heard. Pulmonary/Chest: Effort normal and breath sounds normal.  She has no wheezes. She has no rales. She exhibits no tenderness.  Abdominal: Soft. Bowel sounds are normal. She exhibits no distension and no mass. There is no tenderness.  Musculoskeletal: Normal range of motion.  Neurological: She is alert and oriented to person, place, and time.  Skin: Skin is warm and dry.   Lab Results  Component Value Date   HGBA1C 5.1 03/14/2016    Lipid Panel     Component  Value Date/Time   CHOL 129 09/25/2015 0606   TRIG 80 09/25/2015 0606   HDL 42 09/25/2015 0606   CHOLHDL 3.1 09/25/2015 0606   VLDL 16 09/25/2015 0606   LDLCALC 71 09/25/2015 0606     CMP Latest Ref Rng 09/25/2015 09/24/2015 09/18/2015  Glucose 65 - 99 mg/dL 117(H) 112(H) 116(H)  BUN 6 - 20 mg/dL 15 23(H) 17  Creatinine 0.44 - 1.00 mg/dL 0.75 1.04(H) 0.76  Sodium 135 - 145 mmol/L 141 135 142  Potassium 3.5 - 5.1 mmol/L 3.8 4.0 3.5  Chloride 101 - 111 mmol/L 108 101 106  CO2 22 - 32 mmol/L 25 21(L) 27  Calcium 8.9 - 10.3 mg/dL 8.5(L) 9.1 9.4  Total Protein 6.5 - 8.1 g/dL - - 7.8  Total Bilirubin 0.3 - 1.2 mg/dL - - 0.8  Alkaline Phos 38 - 126 U/L - - 73  AST 15 - 41 U/L - - 26  ALT 14 - 54 U/L - - 16      Assessment & Plan:   1. Type 2 diabetes mellitus without complication, without long-term current use of insulin (HCC) Controlled with A1c of 5.1 Discontinue glipizide to prevent hypoglycemia as she is at high risk due to age - Glucose (CBG) - HgB A1c  2. Essential hypertension Blood pressure is improved from last visit but less than target of less than 140/90 Low-sodium, DASH diet - carvedilol (COREG) 12.5 MG tablet; Take 1 tablet (12.5 mg total) by mouth 2 (two) times daily with a meal.  Dispense: 60 tablet; Refill: 3 - losartan (COZAAR) 100 MG tablet; Take 1 tablet (100 mg total) by mouth daily.  Dispense: 30 tablet; Refill: 3 - amLODipine (NORVASC) 10 MG tablet; Take 1 tablet (10 mg total) by mouth daily.  Dispense: 30 tablet; Refill: 3 - COMPLETE METABOLIC PANEL WITH GFR  3. Seasonal allergies - olopatadine (PATANOL) 0.1 % ophthalmic solution; Place 1 drop into both eyes 2 (two) times daily.  Dispense: 5 mL; Refill: 2 - fluticasone (FLONASE) 50 MCG/ACT nasal spray; Place 1 spray into both nostrils daily.  Dispense: 16 g; Refill: 3  4. Hyperlipidemia Controlled - atorvastatin (LIPITOR) 40 MG tablet; Take 1 tablet (40 mg total) by mouth daily. Reported on 03/14/2016   Dispense: 30 tablet; Refill: 3  5. Blurry vision, bilateral Unknown etiology I have discontinued glipizide and will there will be some improvement with this Recently seen by ophthalmology- ? Glaucoma     Meds ordered this encounter  Medications  . olopatadine (PATANOL) 0.1 % ophthalmic solution    Sig: Place 1 drop into both eyes 2 (two) times daily.    Dispense:  5 mL    Refill:  2  . fluticasone (FLONASE) 50 MCG/ACT nasal spray    Sig: Place 1 spray into both nostrils daily.    Dispense:  16 g    Refill:  3  . carvedilol (COREG) 12.5 MG tablet    Sig: Take 1 tablet (12.5 mg total) by mouth 2 (two) times daily with a meal.  Dispense:  60 tablet    Refill:  3  . losartan (COZAAR) 100 MG tablet    Sig: Take 1 tablet (100 mg total) by mouth daily.    Dispense:  30 tablet    Refill:  3  . amLODipine (NORVASC) 10 MG tablet    Sig: Take 1 tablet (10 mg total) by mouth daily.    Dispense:  30 tablet    Refill:  3  . atorvastatin (LIPITOR) 40 MG tablet    Sig: Take 1 tablet (40 mg total) by mouth daily. Reported on 03/14/2016    Dispense:  30 tablet    Refill:  3    Follow-up: Return in about 3 months (around 06/13/2016) for Follow-up of diabetes mellitus.   Arnoldo Morale MD

## 2016-03-14 NOTE — Patient Instructions (Signed)
Allergic Rhinitis Allergic rhinitis is when the mucous membranes in the nose respond to allergens. Allergens are particles in the air that cause your body to have an allergic reaction. This causes you to release allergic antibodies. Through a chain of events, these eventually cause you to release histamine into the blood stream. Although meant to protect the body, it is this release of histamine that causes your discomfort, such as frequent sneezing, congestion, and an itchy, runny nose.  CAUSES Seasonal allergic rhinitis (hay fever) is caused by pollen allergens that may come from grasses, trees, and weeds. Year-round allergic rhinitis (perennial allergic rhinitis) is caused by allergens such as house dust mites, pet dander, and mold spores. SYMPTOMS  Nasal stuffiness (congestion).  Itchy, runny nose with sneezing and tearing of the eyes. DIAGNOSIS Your health care provider can help you determine the allergen or allergens that trigger your symptoms. If you and your health care provider are unable to determine the allergen, skin or blood testing may be used. Your health care provider will diagnose your condition after taking your health history and performing a physical exam. Your health care provider may assess you for other related conditions, such as asthma, pink eye, or an ear infection. TREATMENT Allergic rhinitis does not have a cure, but it can be controlled by:  Medicines that block allergy symptoms. These may include allergy shots, nasal sprays, and oral antihistamines.  Avoiding the allergen. Hay fever may often be treated with antihistamines in pill or nasal spray forms. Antihistamines block the effects of histamine. There are over-the-counter medicines that may help with nasal congestion and swelling around the eyes. Check with your health care provider before taking or giving this medicine. If avoiding the allergen or the medicine prescribed do not work, there are many new medicines  your health care provider can prescribe. Stronger medicine may be used if initial measures are ineffective. Desensitizing injections can be used if medicine and avoidance does not work. Desensitization is when a patient is given ongoing shots until the body becomes less sensitive to the allergen. Make sure you follow up with your health care provider if problems continue. HOME CARE INSTRUCTIONS It is not possible to completely avoid allergens, but you can reduce your symptoms by taking steps to limit your exposure to them. It helps to know exactly what you are allergic to so that you can avoid your specific triggers. SEEK MEDICAL CARE IF:  You have a fever.  You develop a cough that does not stop easily (persistent).  You have shortness of breath.  You start wheezing.  Symptoms interfere with normal daily activities.   This information is not intended to replace advice given to you by your health care provider. Make sure you discuss any questions you have with your health care provider.   Document Released: 08/13/2001 Document Revised: 12/09/2014 Document Reviewed: 07/26/2013 Elsevier Interactive Patient Education 2016 [employer]  

## 2016-03-14 NOTE — Progress Notes (Signed)
Patient here for diabetes and hypertension follow-up. Patient indicates she continues to have blurry vision. Complains of itchy eyes, cough.

## 2016-03-18 ENCOUNTER — Telehealth: Payer: Self-pay

## 2016-03-18 NOTE — Telephone Encounter (Signed)
Spoke with [facility] interpreter dhais # [id number]. Interpreter called patient number on file, but phone kept ringing, couldn't leave a VM. Patient son Marcelline Deist was called interpreter left a message with the son to call back once he receive my message.  Note to patient:  Labs are normal.

## 2016-03-18 NOTE — Telephone Encounter (Signed)
-----   Message from Arnoldo Morale, MD sent at 03/18/2016  9:17 AM EDT ----- Labs are normal

## 2016-03-19 NOTE — Telephone Encounter (Signed)
Spoke with patient son, Marcelline Deist. Patient son is listed as person of contact for patient's health records. Son was given lab results, verbalize understanding with no further questions.

## 2016-03-19 NOTE — Telephone Encounter (Signed)
-----   Message from Arnoldo Morale, MD sent at 03/18/2016  9:17 AM EDT ----- Labs are normal

## 2016-06-06 ENCOUNTER — Telehealth: Payer: Self-pay | Admitting: Family Medicine

## 2016-06-06 DIAGNOSIS — I10 Essential (primary) hypertension: Secondary | ICD-10-CM

## 2016-06-06 MED ORDER — LOSARTAN POTASSIUM 100 MG PO TABS: 100.0000 mg | ORAL_TABLET | Freq: Every day | ORAL | Status: DC

## 2016-06-06 NOTE — Telephone Encounter (Signed)
Losartan refilled - patient must have office visit with Dr. Jarold Song for further refills

## 2016-06-06 NOTE — Telephone Encounter (Signed)
Patient called requesting medication refill on losartan (COZAAR) 100 MG tablet, pt states she only has 6 pills left. Please f/up

## 2016-06-20 ENCOUNTER — Ambulatory Visit: Payer: Medicare Other | Attending: Family Medicine | Admitting: Physician Assistant

## 2016-06-20 VITALS — BP 148/72 | HR 65 | Temp 98.0°F | Resp 16 | Wt 160.0 lb

## 2016-06-20 DIAGNOSIS — E119 Type 2 diabetes mellitus without complications: Secondary | ICD-10-CM

## 2016-06-20 DIAGNOSIS — I10 Essential (primary) hypertension: Secondary | ICD-10-CM

## 2016-06-20 DIAGNOSIS — R609 Edema, unspecified: Secondary | ICD-10-CM

## 2016-06-20 LAB — GLUCOSE, POCT (MANUAL RESULT ENTRY): POC Glucose: 138 mg/dl — AB (ref 70–99)

## 2016-06-20 LAB — POCT GLYCOSYLATED HEMOGLOBIN (HGB A1C): HEMOGLOBIN A1C: 6.1

## 2016-06-20 MED ORDER — FUROSEMIDE 20 MG PO TABS: 20.0000 mg | ORAL_TABLET | Freq: Every day | ORAL | Status: DC

## 2016-06-20 NOTE — Patient Instructions (Signed)
Check fasting blood sugars 2X each week and record Cut back on sugars and salts Elevate your legs 15-22min 2-3X per day Moderate exercise walking 5-69min 2X daily

## 2016-06-20 NOTE — Progress Notes (Signed)
Patient ID: Caroline Cooper, female   DOB: February 18, 1935, 81 y.o.   MRN: [id number]   Caroline Cooper, is a 81 y.o. female  [id number]  [id number]  DOB - Apr 30, 1935  Subjective:  Chief Complaint and HPI: Caroline Cooper is a 81 y.o. female here today for B leg/foot swelling X  1 week.  No recent travel.  No shortness of breath or CP.  The swelling improves with elevating her legs and worsens if she is up on her feet a lot.  She denies any new meds or OTC meds.  She is "diet controlled" diabetic.  No regular exercise  But states she is very active at home.  She is able to perform all ADL without assistance.  She drinks a lot of water.  Not checking sugars regularly.  Admits to a lot of salt and candy in her diet.  Her son is with her today.   ROS:   Constitutional:  No f/c, No night sweats, No unexplained weight loss. EENT:  No vision changes, No blurry vision, No hearing changes. No mouth, throat, or ear problems.  Respiratory: No cough, No SOB Cardiac: No CP, no palpitations GI:  No abd pain, No N/V/D. GU: No Urinary s/sx Musculoskeletal: No joint pain Neuro: No headache, no dizziness, no motor weakness.  Skin: No rash Endocrine:  No polydipsia. No polyuria.  Psych: Denies SI/HI  No problems updated.  ALLERGIES: Allergies  Allergen Reactions  . Penicillins     Unknown reaction  Has patient had a PCN reaction causing immediate rash, facial/tongue/throat swelling, SOB or lightheadedness with hypotension: No Has patient had a PCN reaction causing severe rash involving mucus membranes or skin necrosis: No Has patient had a PCN reaction that required hospitalization No Has patient had a PCN reaction occurring within the last 10 years: No If all of the above answers are "NO", then may proceed with Cephalosporin use.     PAST MEDICAL HISTORY: Past Medical History  Diagnosis Date  . Diabetes mellitus without complication ([place])   . Hypertension   . HTN (hypertension) 09/24/2015  . DM  (diabetes mellitus) ([place]) 09/24/2015  . Hyperlipidemia 09/24/2015  . Diverticulitis     MEDICATIONS AT HOME: Prior to Admission medications   Medication Sig Start Date End Date Taking? Authorizing Provider  amLODipine (NORVASC) 10 MG tablet Take 1 tablet (10 mg total) by mouth daily. 03/14/16  Yes Arnoldo Morale, MD  atorvastatin (LIPITOR) 40 MG tablet Take 1 tablet (40 mg total) by mouth daily. Reported on 03/14/2016 03/14/16  Yes Arnoldo Morale, MD  carvedilol (COREG) 12.5 MG tablet Take 1 tablet (12.5 mg total) by mouth 2 (two) times daily with a meal. 03/14/16  Yes Arnoldo Morale, MD  losartan (COZAAR) 100 MG tablet Take 1 tablet (100 mg total) by mouth daily. Must have office visit for refills 06/06/16  Yes Arnoldo Morale, MD  Multiple Vitamins-Minerals (CENTRUM ADULTS) TABS Take 1 tablet by mouth daily.   Yes Historical Provider, MD  fluticasone (FLONASE) 50 MCG/ACT nasal spray Place 1 spray into both nostrils daily. Patient not taking: Reported on 06/20/2016 03/14/16   Arnoldo Morale, MD  furosemide (LASIX) 20 MG tablet Take 1 tablet (20 mg total) by mouth daily. X 5 days then prn sparingly for leg swelling 06/20/16   Dionne Bucy McClung, PA-C  ibuprofen (ADVIL,MOTRIN) 400 MG tablet Take 1 tablet (400 mg total) by mouth 3 (three) times daily. Take 3 times daily x 4 days then use as needed. Patient not taking: Reported  on 06/20/2016 09/25/15   Eugenie Filler, MD  olopatadine (PATANOL) 0.1 % ophthalmic solution Place 1 drop into both eyes 2 (two) times daily. Patient not taking: Reported on 06/20/2016 03/14/16   Arnoldo Morale, MD     Objective:  EXAM:   Filed Vitals:   06/20/16 0907  BP: 148/72  Pulse: 65  Temp: 98 F (36.7 C)  TempSrc: Oral  Resp: 16  Weight: 160 lb (72.576 kg)  SpO2: 99%    General appearance : A&OX3. NAD. Non-toxic-appearing HEENT: Atraumatic and Normocephalic.  PERRLA. EOM intact.  Neck: supple, no JVD. No cervical lymphadenopathy. No thyromegaly Chest/Lungs:   Breathing-non-labored, Good air entry bilaterally, breath sounds normal without rales, rhonchi, or wheezing  CVS: S1 S2 regular, no murmurs, gallops, rubs  Extremities: Bilateral Lower Ext shows 1-2+ edema, both legs are warm to touch with = pulse throughout.  No erythema of calves.  Neg Homan's B.   Neurology:  CN II-XII grossly intact, Non focal.   Psych:  TP linear. J/I WNL. Normal speech. Appropriate eye contact and affect.  Skin:  No Rash  Data Review Lab Results  Component Value Date   HGBA1C 5.1 03/14/2016   HGBA1C 5.90 12/29/2015   HGBA1C 5.60 10/03/2015   HgbA1C 06/20/2016=6.1  Assessment & Plan   1. Edema, unspecified type(most likely dependent edema) Lasix 20mg  once daily X 5 days then sparingly prn Cut back on sugars and salts Elevate your legs 15-3min 2-3X per day Moderate exercise walking 5-72min 2X daily   2. Type 2 diabetes mellitus without complication, without long-term current use of insulin (HCC) Check fasting blood sugars 2X each week and record - Glucose (CBG)  3. Essential hypertension Continue current medications. We have discussed target BP range and blood pressure goal. I have advised patient to check BP regularly and to call us back or report to clinic if the numbers are consistently higher than 140/90. We discussed the importance of compliance with medical therapy and DASH diet recommended, consequences of uncontrolled hypertension discussed.    Patient have been counseled extensively about nutrition and exercise  Return in about 3 months (around 09/20/2016) for BP and diabetes check.;  Sooner if this plan doesn't resolve the current problem.   The patient was given clear instructions to go to ER or return to medical center if symptoms don't improve, worsen or new problems develop. The patient verbalized understanding. The patient was told to call to get lab results if they haven't heard anything in the next week.     Freeman Caldron, PA-C [facility] [place], [place]   06/20/2016, 9:43 AM

## 2016-06-20 NOTE — Progress Notes (Signed)
C/o bilateral foot edema x 1 week.

## 2016-06-21 ENCOUNTER — Telehealth: Payer: Self-pay | Admitting: Family Medicine

## 2016-06-21 DIAGNOSIS — I10 Essential (primary) hypertension: Secondary | ICD-10-CM

## 2016-06-21 MED ORDER — AMLODIPINE BESYLATE 10 MG PO TABS: 10.0000 mg | ORAL_TABLET | Freq: Every day | ORAL | Status: DC

## 2016-06-21 MED ORDER — LOSARTAN POTASSIUM 100 MG PO TABS: 100.0000 mg | ORAL_TABLET | Freq: Every day | ORAL | Status: DC

## 2016-06-21 NOTE — Telephone Encounter (Signed)
Pt called requesting refills on amLODipine (NORVASC) 10 MG tablet, losartan (COZAAR) 100 MG tablet. Pt saw walk-in provider [place] but forgot to mention she needed additional refills, please f/up

## 2016-06-21 NOTE — Telephone Encounter (Signed)
Requested medications were refilled. 

## 2016-07-01 ENCOUNTER — Ambulatory Visit: Payer: Medicare Other | Admitting: Family Medicine

## 2016-07-05 DIAGNOSIS — H10413 Chronic giant papillary conjunctivitis, bilateral: Secondary | ICD-10-CM | POA: Diagnosis not present

## 2016-07-05 DIAGNOSIS — H401132 Primary open-angle glaucoma, bilateral, moderate stage: Secondary | ICD-10-CM | POA: Diagnosis not present

## 2016-07-05 DIAGNOSIS — H40013 Open angle with borderline findings, low risk, bilateral: Secondary | ICD-10-CM | POA: Diagnosis not present

## 2016-07-05 DIAGNOSIS — H02831 Dermatochalasis of right upper eyelid: Secondary | ICD-10-CM | POA: Diagnosis not present

## 2016-07-05 DIAGNOSIS — Z961 Presence of intraocular lens: Secondary | ICD-10-CM | POA: Diagnosis not present

## 2016-07-05 DIAGNOSIS — H43813 Vitreous degeneration, bilateral: Secondary | ICD-10-CM | POA: Diagnosis not present

## 2016-07-05 DIAGNOSIS — H02834 Dermatochalasis of left upper eyelid: Secondary | ICD-10-CM | POA: Diagnosis not present

## 2016-07-08 ENCOUNTER — Other Ambulatory Visit: Payer: Self-pay | Admitting: Family Medicine

## 2016-07-08 DIAGNOSIS — I10 Essential (primary) hypertension: Secondary | ICD-10-CM

## 2016-07-10 ENCOUNTER — Other Ambulatory Visit: Payer: Self-pay | Admitting: Family Medicine

## 2016-07-10 DIAGNOSIS — I10 Essential (primary) hypertension: Secondary | ICD-10-CM

## 2016-07-22 ENCOUNTER — Other Ambulatory Visit: Payer: Self-pay | Admitting: Physician Assistant

## 2016-08-08 DIAGNOSIS — J309 Allergic rhinitis, unspecified: Secondary | ICD-10-CM | POA: Diagnosis not present

## 2016-08-08 DIAGNOSIS — J45909 Unspecified asthma, uncomplicated: Secondary | ICD-10-CM | POA: Diagnosis not present

## 2016-08-08 DIAGNOSIS — I10 Essential (primary) hypertension: Secondary | ICD-10-CM | POA: Diagnosis not present

## 2016-08-30 ENCOUNTER — Other Ambulatory Visit: Payer: Self-pay | Admitting: Family Medicine

## 2016-08-30 DIAGNOSIS — E785 Hyperlipidemia, unspecified: Secondary | ICD-10-CM

## 2016-09-30 ENCOUNTER — Ambulatory Visit: Payer: Medicare Other | Attending: Family Medicine | Admitting: Family Medicine

## 2016-09-30 ENCOUNTER — Encounter (INDEPENDENT_AMBULATORY_CARE_PROVIDER_SITE_OTHER): Payer: Self-pay

## 2016-09-30 ENCOUNTER — Encounter: Payer: Self-pay | Admitting: Family Medicine

## 2016-09-30 VITALS — BP 147/76 | HR 72 | Temp 98.7°F | Ht 62.0 in | Wt 158.0 lb

## 2016-09-30 DIAGNOSIS — M25512 Pain in left shoulder: Secondary | ICD-10-CM | POA: Diagnosis not present

## 2016-09-30 DIAGNOSIS — E0800 Diabetes mellitus due to underlying condition with hyperosmolarity without nonketotic hyperglycemic-hyperosmolar coma (NKHHC): Secondary | ICD-10-CM | POA: Insufficient documentation

## 2016-09-30 DIAGNOSIS — I10 Essential (primary) hypertension: Secondary | ICD-10-CM | POA: Insufficient documentation

## 2016-09-30 DIAGNOSIS — Z23 Encounter for immunization: Secondary | ICD-10-CM

## 2016-09-30 DIAGNOSIS — R350 Frequency of micturition: Secondary | ICD-10-CM

## 2016-09-30 DIAGNOSIS — E7800 Pure hypercholesterolemia, unspecified: Secondary | ICD-10-CM | POA: Diagnosis not present

## 2016-09-30 DIAGNOSIS — G8929 Other chronic pain: Secondary | ICD-10-CM

## 2016-09-30 LAB — GLUCOSE, POCT (MANUAL RESULT ENTRY): POC GLUCOSE: 112 mg/dL — AB (ref 70–99)

## 2016-09-30 LAB — POCT GLYCOSYLATED HEMOGLOBIN (HGB A1C): Hemoglobin A1C: 6.2

## 2016-09-30 MED ORDER — LOSARTAN POTASSIUM 100 MG PO TABS: 100.0000 mg | ORAL_TABLET | Freq: Every day | ORAL | 1 refills | Status: DC

## 2016-09-30 MED ORDER — CARVEDILOL 25 MG PO TABS: 25.0000 mg | ORAL_TABLET | Freq: Two times a day (BID) | ORAL | 1 refills | Status: DC

## 2016-09-30 MED ORDER — ATORVASTATIN CALCIUM 40 MG PO TABS: 40.0000 mg | ORAL_TABLET | Freq: Every day | ORAL | 1 refills | Status: DC

## 2016-09-30 NOTE — Progress Notes (Signed)
Subjective:    Patient ID: Caroline Cooper, female    DOB: [birth year], 81 y.o.   MRN: [id number] Current issue Follow up on Diabetes HPI  Caroline Cooper is a 81 year old Hispanic female who came in today for diabetes follow up. Caroline Cooper had been on glimepiride 4mg  but this medication was discontinued in April 2017 due to her Hgb A1c of 5.1. She is here to day and her A1c is 6.1  Urinary Frequency Patient indicates disruption in her sleep cycle because she has to wake up about four times during the night to urinate Essential Hypertension Patient is currently on Amlodipine and coreg for hypertension. She was started on Lasix for ankle edema.  Bilateral shoulder Pain Patient is complaining of an increase pain to her shoulders. Pain is worse in her left shoulder than the right but pain comes mostly after house chores. She describes the pain is achy and its usually in her upper arm but radiates to her shoulders. She rates pain level at 4/10 and tylenol relieves the pain but does not take it away.  Allergy Patient is complaining of watery eyes and itchy throat. She took zyrtec and had relief   Past Medical History:  Diagnosis Date  . Diabetes mellitus without complication ([place])   . Diverticulitis   . DM (diabetes mellitus) ([place]) 09/24/2015  . HTN (hypertension) 09/24/2015  . Hyperlipidemia 09/24/2015  . Hypertension     Past Surgical History:  Procedure Laterality Date  . ABDOMINAL HYSTERECTOMY    . CHOLECYSTECTOMY    . COLON SURGERY     COLECTOMY FOR DIVERTICULITIS  . HERNIA REPAIR      Allergies  Allergen Reactions  . Penicillins     Unknown reaction  Has patient had a PCN reaction causing immediate rash, facial/tongue/throat swelling, SOB or lightheadedness with hypotension: No Has patient had a PCN reaction causing severe rash involving mucus membranes or skin necrosis: No Has patient had a PCN reaction that required hospitalization No Has patient had a PCN reaction occurring within the  last 10 years: No If all of the above answers are "NO", then may proceed with Cephalosporin use.     Current Outpatient Prescriptions on File Prior to Visit  Medication Sig Dispense Refill  . ibuprofen (ADVIL,MOTRIN) 400 MG tablet Take 1 tablet (400 mg total) by mouth 3 (three) times daily. Take 3 times daily x 4 days then use as needed. 30 tablet 0  . Multiple Vitamins-Minerals (CENTRUM ADULTS) TABS Take 1 tablet by mouth daily.    Marland Kitchen olopatadine (PATANOL) 0.1 % ophthalmic solution Place 1 drop into both eyes 2 (two) times daily. (Patient not taking: Reported on 09/30/2016) 5 mL 2   No current facility-administered medications on file prior to visit.      Review of Systems  Constitutional: Negative for appetite change, chills and fatigue.  HENT: Negative for congestion, ear discharge, ear pain, rhinorrhea and sinus pressure.   Eyes: Positive for discharge and itching. Negative for visual disturbance.  Respiratory: Negative for cough, chest tightness, shortness of breath and wheezing.   Cardiovascular: Positive for leg swelling. Negative for chest pain and palpitations.  Gastrointestinal: Negative for abdominal distention, abdominal pain, anal bleeding, blood in stool, constipation, diarrhea and nausea.  Endocrine: Positive for polydipsia and polyuria. Negative for cold intolerance, heat intolerance and polyphagia.  Genitourinary: Positive for frequency. Negative for difficulty urinating, flank pain and hematuria.  Musculoskeletal: Positive for arthralgias. Negative for gait problem.  Skin: Negative for color change, pallor and  rash.  Neurological: Negative for dizziness, weakness, light-headedness, numbness and headaches.  Hematological: Negative for adenopathy. Does not bruise/bleed easily.  Psychiatric/Behavioral: Positive for sleep disturbance. Negative for agitation, behavioral problems and confusion.       Objective: Vitals:   09/30/16 1014  BP: (!) 147/76  Pulse: 72  Temp:  98.7 F (37.1 C)      Physical Exam  Constitutional: She is oriented to person, place, and time. She appears well-developed and well-nourished.  HENT:  Head: Normocephalic and atraumatic.  Left Ear: External ear normal.  Mouth/Throat: Oropharynx is clear and moist.  Eyes: EOM are normal. Pupils are equal, round, and reactive to light.  Cardiovascular: Normal rate and regular rhythm.   Pulmonary/Chest: Breath sounds normal. No respiratory distress. She has no wheezes. She exhibits no tenderness.  Abdominal: She exhibits no distension and no mass. There is no tenderness.  Musculoskeletal: Normal range of motion.  Neurological: She is alert and oriented to person, place, and time.  Skin: Skin is warm and dry.  Psychiatric: She has a normal mood and affect. Her behavior is normal. Judgment and thought content normal.          Assessment & Plan:  Diabetes Patient Hg A1c is 6.1 today. Will continue to monitor Hg A1c and add medication if it continues to go up.  Essential Hypertension Amlodipine will be discontinued due to ankle edema.  Lasix will be discontinued at this time Coreg 12.5 mg will be increased to 25 mg bid. Patient will double her current dose of 12.5 mg until its finished.  Pure hypercholesterolemia Patient will continue on Lipitor 40 mg at this time. We could not do lipid panel today because patient has eaten. She will be back on Wednesday to have her lipids check. Lipids  Chronic left Shoulder Pain Patient's ibuprofen 400 mg will be refilled and she can have it as PRN for shoulder pain.  Urinary Frequency Possibly overactive bladder but Patient and her son have decided to minimize the amount of water she drinks before bedtime. This may help reduce her night time frequency. Patient and her son will like to try this behavior modification before medication.   Follow up Follow up in 1 month for hypertension.

## 2016-10-03 ENCOUNTER — Ambulatory Visit: Payer: Medicare Other | Attending: Family Medicine

## 2016-10-03 DIAGNOSIS — I10 Essential (primary) hypertension: Secondary | ICD-10-CM | POA: Diagnosis not present

## 2016-10-03 DIAGNOSIS — E0800 Diabetes mellitus due to underlying condition with hyperosmolarity without nonketotic hyperglycemic-hyperosmolar coma (NKHHC): Secondary | ICD-10-CM | POA: Diagnosis not present

## 2016-10-03 LAB — COMPLETE METABOLIC PANEL WITH GFR
ALBUMIN: 4.3 g/dL (ref 3.6–5.1)
ALK PHOS: 84 U/L (ref 33–130)
ALT: 11 U/L (ref 6–29)
AST: 17 U/L (ref 10–35)
BILIRUBIN TOTAL: 0.8 mg/dL (ref 0.2–1.2)
BUN: 22 mg/dL (ref 7–25)
CO2: 29 mmol/L (ref 20–31)
Calcium: 9.5 mg/dL (ref 8.6–10.4)
Chloride: 104 mmol/L (ref 98–110)
Creat: 0.93 mg/dL (ref 0.60–0.93)
GFR, Est African American: 68 mL/min (ref >=60)
GFR, Est Non African American: 59 mL/min — ABNORMAL LOW (ref >=60)
GLUCOSE: 122 mg/dL — AB (ref 65–99)
POTASSIUM: 4.6 mmol/L (ref 3.5–5.3)
SODIUM: 141 mmol/L (ref 135–146)
TOTAL PROTEIN: 7.3 g/dL (ref 6.1–8.1)

## 2016-10-03 LAB — LIPID PANEL
CHOL/HDL RATIO: 3 ratio (ref <=5.0)
Cholesterol: 146 mg/dL (ref 125–200)
HDL: 48 mg/dL (ref >=46)
LDL CALC: 72 mg/dL (ref <130)
Triglycerides: 129 mg/dL (ref <150)
VLDL: 26 mg/dL (ref <30)

## 2016-10-03 NOTE — Progress Notes (Signed)
Patient here for lab visit only 

## 2016-10-04 ENCOUNTER — Telehealth: Payer: Self-pay

## 2016-10-04 LAB — MICROALBUMIN / CREATININE URINE RATIO
CREATININE, URINE: 198 mg/dL (ref 20–320)
MICROALB UR: 1.1 mg/dL
MICROALB/CREAT RATIO: 6 ug/mg{creat} (ref <30)

## 2016-10-04 NOTE — Telephone Encounter (Signed)
Writer called patient per Dr. Jarold Song and spoke with her daughter who stated understanding of patient's lab results.

## 2016-10-04 NOTE — Telephone Encounter (Signed)
-----   Message from Arnoldo Morale, MD sent at 10/04/2016  4:33 PM EDT ----- Please inform the patient that labs are normal. Thank you.

## 2016-10-08 ENCOUNTER — Emergency Department (HOSPITAL_COMMUNITY)
Admission: EM | Admit: 2016-10-08 | Discharge: 2016-10-08 | Disposition: A | Discharge status: Home / Self Care | Payer: Medicare Other | Attending: Emergency Medicine | Admitting: Emergency Medicine

## 2016-10-08 ENCOUNTER — Emergency Department (HOSPITAL_COMMUNITY): Payer: Medicare Other

## 2016-10-08 ENCOUNTER — Encounter (HOSPITAL_COMMUNITY): Payer: Self-pay | Admitting: Emergency Medicine

## 2016-10-08 DIAGNOSIS — R1013 Epigastric pain: Secondary | ICD-10-CM | POA: Insufficient documentation

## 2016-10-08 DIAGNOSIS — I10 Essential (primary) hypertension: Secondary | ICD-10-CM | POA: Diagnosis not present

## 2016-10-08 DIAGNOSIS — E119 Type 2 diabetes mellitus without complications: Secondary | ICD-10-CM | POA: Diagnosis not present

## 2016-10-08 DIAGNOSIS — R1012 Left upper quadrant pain: Secondary | ICD-10-CM | POA: Diagnosis not present

## 2016-10-08 LAB — CBC
HEMATOCRIT: 37.8 % (ref 36.0–46.0)
HEMOGLOBIN: 12.9 g/dL (ref 12.0–15.0)
MCH: 30.5 pg (ref 26.0–34.0)
MCHC: 34.1 g/dL (ref 30.0–36.0)
MCV: 89.4 fL (ref 78.0–100.0)
Platelets: 178 10*3/uL (ref 150–400)
RBC: 4.23 MIL/uL (ref 3.87–5.11)
RDW: 13.2 % (ref 11.5–15.5)
WBC: 12.7 10*3/uL — ABNORMAL HIGH (ref 4.0–10.5)

## 2016-10-08 LAB — COMPREHENSIVE METABOLIC PANEL
ALBUMIN: 4.3 g/dL (ref 3.5–5.0)
ALT: 18 U/L (ref 14–54)
ANION GAP: 8 (ref 5–15)
AST: 27 U/L (ref 15–41)
Alkaline Phosphatase: 84 U/L (ref 38–126)
BUN: 18 mg/dL (ref 6–20)
CHLORIDE: 105 mmol/L (ref 101–111)
CO2: 26 mmol/L (ref 22–32)
Calcium: 9 mg/dL (ref 8.9–10.3)
Creatinine, Ser: 0.9 mg/dL (ref 0.44–1.00)
GFR calc Af Amer: >60 mL/min (ref >60)
GFR calc non Af Amer: 59 mL/min — ABNORMAL LOW (ref >60)
GLUCOSE: 160 mg/dL — AB (ref 65–99)
POTASSIUM: 4.1 mmol/L (ref 3.5–5.1)
SODIUM: 139 mmol/L (ref 135–145)
TOTAL PROTEIN: 7.5 g/dL (ref 6.5–8.1)
Total Bilirubin: 0.9 mg/dL (ref 0.3–1.2)

## 2016-10-08 LAB — URINE MICROSCOPIC-ADD ON: Bacteria, UA: NONE SEEN

## 2016-10-08 LAB — URINALYSIS, ROUTINE W REFLEX MICROSCOPIC
BILIRUBIN URINE: NEGATIVE
GLUCOSE, UA: NEGATIVE mg/dL
Ketones, ur: NEGATIVE mg/dL
Leukocytes, UA: NEGATIVE
NITRITE: NEGATIVE
PH: 5.5 (ref 5.0–8.0)
Protein, ur: NEGATIVE mg/dL
SPECIFIC GRAVITY, URINE: 1.013 (ref 1.005–1.030)

## 2016-10-08 LAB — LIPASE, BLOOD: Lipase: 18 U/L (ref 11–51)

## 2016-10-08 MED ORDER — FENTANYL CITRATE (PF) 100 MCG/2ML IJ SOLN
50.0000 ug | Freq: Once | INTRAMUSCULAR | Status: AC
  Administered 2016-10-08: 50 ug via INTRAVENOUS
  Filled 2016-10-08: qty 2

## 2016-10-08 MED ORDER — ONDANSETRON HCL 4 MG/2ML IJ SOLN
4.0000 mg | Freq: Once | INTRAMUSCULAR | Status: AC
  Administered 2016-10-08: 4 mg via INTRAVENOUS
  Filled 2016-10-08: qty 2

## 2016-10-08 NOTE — ED Provider Notes (Signed)
[facility] DEPT Provider Note   CSN: [id number] Arrival date & time: 10/08/16  0138  By signing my name below, I, Emmanuella Mensah, attest that this documentation has been prepared under the direction and in the presence of Ripley Fraise, MD. Electronically Signed: Judithann Sauger, ED Scribe. 10/08/16. 3:03 AM.   History   Chief Complaint Chief Complaint  Patient presents with  . Abdominal Pain   HPI Comments: Caroline Cooper is a 81 y.o. female with a hx of DM, diverticulitis, and hypertension who presents to the Emergency Department complaining of sudden onset, persistent moderate epigastric pain onset 45 minutes after eating dinner yesterday. She notes that the pain is gradually radiating towards her umbilicus. She reports associated episodes of non-bloody vomiting. No alleviating or exacerbating factors noted. She states that she tried Zantac with no relief. Pt has an allergy to Penicillins. She reports a hx of hysterectomy, cholecystectomy, hernia repair, and colon surgery. She denies any fever, chills, diarrhea, constipation, chest pain, SOB, back pain, dysuria, or any other symptoms.    The history is provided by the patient. No language interpreter was used.  Abdominal Pain   This is a new problem. The current episode started yesterday. The problem has been gradually worsening. The pain is associated with eating. The pain is located in the epigastric region. The pain is moderate. Associated symptoms include nausea and vomiting. Pertinent negatives include fever, diarrhea, constipation and dysuria. Nothing aggravates the symptoms. Nothing relieves the symptoms. Past workup includes surgery.    Past Medical History:  Diagnosis Date  . Diabetes mellitus without complication ([place])   . Diverticulitis   . DM (diabetes mellitus) ([place]) 09/24/2015  . HTN (hypertension) 09/24/2015  . Hyperlipidemia 09/24/2015  . Hypertension     Patient Active Problem List   Diagnosis Date Noted   . Seasonal allergies 03/14/2016  . Need for vaccination for H flu type B with pedvaxHIB 10/03/2015  . Neck pain   . Type 2 diabetes, HbA1c goal < 7% (HCC)   . Elevated troponin 09/24/2015  . Neck pain on right side 09/24/2015  . Pain of right upper extremity 09/24/2015  . HTN (hypertension) 09/24/2015  . DM (diabetes mellitus) ([place]) 09/24/2015  . Hyperlipidemia 09/24/2015  . Leukocytosis 09/24/2015  . Colitis:/ DIAGNOSED 09/18/2015 09/24/2015    Past Surgical History:  Procedure Laterality Date  . ABDOMINAL HYSTERECTOMY    . CHOLECYSTECTOMY    . COLON SURGERY     COLECTOMY FOR DIVERTICULITIS  . HERNIA REPAIR      OB History    No data available       Home Medications    Prior to Admission medications   Medication Sig Start Date End Date Taking? Authorizing Provider  atorvastatin (LIPITOR) 40 MG tablet Take 1 tablet (40 mg total) by mouth daily. 09/30/16  Yes Arnoldo Morale, MD  carvedilol (COREG) 25 MG tablet Take 1 tablet (25 mg total) by mouth 2 (two) times daily with a meal. 09/30/16  Yes Arnoldo Morale, MD  losartan (COZAAR) 100 MG tablet Take 1 tablet (100 mg total) by mouth daily. 09/30/16  Yes Arnoldo Morale, MD  Multiple Vitamins-Minerals (CENTRUM ADULTS) TABS Take 1 tablet by mouth daily.   Yes Historical Provider, MD  olopatadine (PATANOL) 0.1 % ophthalmic solution Place 1 drop into both eyes 2 (two) times daily. Patient not taking: Reported on 10/08/2016 03/14/16   Arnoldo Morale, MD    Family History History reviewed. No pertinent family history.  Social History Social History  Substance  Use Topics  . Smoking status: Never Smoker  . Smokeless tobacco: Never Used  . Alcohol use No     Allergies   Penicillins   Review of Systems Review of Systems  Constitutional: Negative for chills and fever.  Respiratory: Negative for shortness of breath.   Cardiovascular: Negative for chest pain.  Gastrointestinal: Positive for abdominal pain, nausea and vomiting.  Negative for constipation and diarrhea.  Genitourinary: Negative for dysuria.  All other systems reviewed and are negative.    Physical Exam Updated Vital Signs BP 158/70 (BP Location: Right Arm)   Pulse 67   Temp 98.2 F (36.8 C) (Oral)   Resp 16   SpO2 97%   Physical Exam  Nursing note and vitals reviewed.  CONSTITUTIONAL: Well developed/well nourished HEAD: Normocephalic/atraumatic EYES: EOMI/PERRL ENMT: Mucous membranes moist NECK: supple no meningeal signs SPINE/BACK:entire spine nontender CV: S1/S2 noted, no murmurs/rubs/gallops noted LUNGS: Lungs are clear to auscultation bilaterally, no apparent distress ABDOMEN: soft, moderate tenderness and epigastric, LUQ and LLQ, no rebound or guarding, decreased bowel sounds noted throughout abdomen, multiple healed scars noted.  GU:no cva tenderness NEURO: Pt is awake/alert/appropriate, moves all extremitiesx4.  No facial droop.   EXTREMITIES: pulses normal/equal, full ROM SKIN: warm, color normal PSYCH: no abnormalities of mood noted, alert and oriented to situation   ED Treatments / Results  DIAGNOSTIC STUDIES: Oxygen Saturation is 97% on RA, normal by my interpretation.    COORDINATION OF CARE: 3:01 AM- Pt advised of plan for treatment and pt agrees. Pt will receive lab work and x-ray abdomen for further evaluation. She will also receive fentanyl IM and Zofran IM.    Labs (all labs ordered are listed, but only abnormal results are displayed) Labs Reviewed  COMPREHENSIVE METABOLIC PANEL - Abnormal; Notable for the following:       Result Value   Glucose, Bld 160 (*)    GFR calc non Af Amer 59 (*)    All other components within normal limits  CBC - Abnormal; Notable for the following:    WBC 12.7 (*)    All other components within normal limits  URINALYSIS, ROUTINE W REFLEX MICROSCOPIC (NOT AT [facility]) - Abnormal; Notable for the following:    Hgb urine dipstick TRACE (*)    All other components within normal limits    URINE MICROSCOPIC-ADD ON - Abnormal; Notable for the following:    Squamous Epithelial / LPF 0-5 (*)    All other components within normal limits  LIPASE, BLOOD    EKG  EKG Interpretation  Date/Time:  Tuesday October 08 2016 03:10:10 EST Ventricular Rate:  71 PR Interval:    QRS Duration: 97 QT Interval:  400 QTC Calculation: 435 R Axis:   0 Text Interpretation:  Sinus rhythm RSR' in V1 or V2, right VCD or RVH No significant change since last tracing Confirmed by Christy Gentles  MD, [place] ([id number]) on 10/08/2016 3:30:23 AM       Radiology Dg Abd Acute W/chest  Result Date: 10/08/2016 CLINICAL DATA:  Left upper quadrant pain EXAM: DG ABDOMEN ACUTE W/ 1V CHEST COMPARISON:  Chest radiograph 09/24/2015 FINDINGS: No focal airspace consolidation or pulmonary edema. No pneumothorax or pleural effusion. Cardiomediastinal contours are normal. Minimal aortic arch calcification. There are surgical clips seen within the abdomen. Paucity of small bowel gas. No free intraperitoneal air. Moderate retained colonic stool. IMPRESSION: 1. Negative abdominal radiographs. 2. No acute cardiopulmonary disease. 3. Aortic atherosclerosis. Electronically Signed   By: Cletus Gash.D.  On: 10/08/2016 03:42    Procedures Procedures (including critical care time)  Medications Ordered in ED Medications  ondansetron (ZOFRAN) injection 4 mg (4 mg Intravenous Given 10/08/16 0319)  fentaNYL (SUBLIMAZE) injection 50 mcg (50 mcg Intravenous Given 10/08/16 0319)     Initial Impression / Assessment and Plan / ED Course  Ripley Fraise, MD has reviewed the triage vital signs and the nursing notes.  Pertinent labs & imaging results that were available during my care of the patient were reviewed by me and considered in my medical decision making (see chart for details).  Clinical Course     Pt with abd pain after eating After one dose of medication, pain nearly resolved She feels improved Only mild epigastric  tenderness She is smiling/awake/alert She would like to be discharged I feel this is reasonable given her appearance We discussed strict ER return precautions Patient/family agreeable with plan She currently has no signs of acute abdominal emergency   Final Clinical Impressions(s) / ED Diagnoses   Final diagnoses:  Epigastric pain    New Prescriptions New Prescriptions   No medications on file   I personally performed the services described in this documentation, which was scribed in my presence. The recorded information has been reviewed and is accurate.        Ripley Fraise, MD 10/08/16 [phone]

## 2016-10-08 NOTE — ED Triage Notes (Signed)
Pt states about 30 minutes after she ate supper she started to have pain in her epigastric area  Pt states the pain has worked its way down from her upper abd to her lower abdomen  Pt states she took a zantac but it has not helped  Pt denies any N/V/D

## 2016-10-08 NOTE — ED Notes (Signed)
Patient transported to X-ray 

## 2016-10-08 NOTE — Discharge Instructions (Signed)

## 2016-11-19 ENCOUNTER — Ambulatory Visit: Payer: Medicare Other | Attending: Family Medicine | Admitting: Family Medicine

## 2016-11-19 ENCOUNTER — Encounter: Payer: Self-pay | Admitting: Family Medicine

## 2016-11-19 VITALS — BP 160/80 | Temp 98.6°F | Ht 63.0 in | Wt 158.0 lb

## 2016-11-19 DIAGNOSIS — M278 Other specified diseases of jaws: Secondary | ICD-10-CM | POA: Diagnosis not present

## 2016-11-19 DIAGNOSIS — Z90710 Acquired absence of both cervix and uterus: Secondary | ICD-10-CM | POA: Insufficient documentation

## 2016-11-19 DIAGNOSIS — Z79899 Other long term (current) drug therapy: Secondary | ICD-10-CM | POA: Insufficient documentation

## 2016-11-19 DIAGNOSIS — E0800 Diabetes mellitus due to underlying condition with hyperosmolarity without nonketotic hyperglycemic-hyperosmolar coma (NKHHC): Secondary | ICD-10-CM | POA: Diagnosis not present

## 2016-11-19 DIAGNOSIS — I10 Essential (primary) hypertension: Secondary | ICD-10-CM | POA: Insufficient documentation

## 2016-11-19 DIAGNOSIS — E119 Type 2 diabetes mellitus without complications: Secondary | ICD-10-CM | POA: Insufficient documentation

## 2016-11-19 DIAGNOSIS — Z9889 Other specified postprocedural states: Secondary | ICD-10-CM | POA: Diagnosis not present

## 2016-11-19 DIAGNOSIS — Z0001 Encounter for general adult medical examination with abnormal findings: Secondary | ICD-10-CM | POA: Diagnosis not present

## 2016-11-19 DIAGNOSIS — Z9049 Acquired absence of other specified parts of digestive tract: Secondary | ICD-10-CM | POA: Diagnosis not present

## 2016-11-19 DIAGNOSIS — N3281 Overactive bladder: Secondary | ICD-10-CM

## 2016-11-19 DIAGNOSIS — E870 Hyperosmolality and hypernatremia: Secondary | ICD-10-CM | POA: Insufficient documentation

## 2016-11-19 DIAGNOSIS — M270 Developmental disorders of jaws: Secondary | ICD-10-CM | POA: Insufficient documentation

## 2016-11-19 LAB — GLUCOSE, POCT (MANUAL RESULT ENTRY): POC GLUCOSE: 90 mg/dL (ref 70–99)

## 2016-11-19 MED ORDER — OXYBUTYNIN CHLORIDE 5 MG PO TABS: 5.0000 mg | ORAL_TABLET | Freq: Two times a day (BID) | ORAL | 1 refills | Status: DC

## 2016-11-19 MED ORDER — HYDRALAZINE HCL 25 MG PO TABS: 25.0000 mg | ORAL_TABLET | Freq: Two times a day (BID) | ORAL | 3 refills | Status: DC

## 2016-11-19 MED ORDER — AZITHROMYCIN 250 MG PO TABS: ORAL_TABLET | ORAL | 0 refills | Status: DC

## 2016-11-19 NOTE — Patient Instructions (Signed)
Hypertension Hypertension, commonly called high blood pressure, is when the force of blood pumping through your arteries is too strong. Your arteries are the blood vessels that carry blood from your heart throughout your body. A blood pressure reading consists of a higher number over a lower number, such as 110/72. The higher number (systolic) is the pressure inside your arteries when your heart pumps. The lower number (diastolic) is the pressure inside your arteries when your heart relaxes. Ideally you want your blood pressure below 120/80. Hypertension forces your heart to work harder to pump blood. Your arteries may become narrow or stiff. Having untreated or uncontrolled hypertension can cause heart attack, stroke, kidney disease, and other problems. What increases the risk? Some risk factors for high blood pressure are controllable. Others are not. Risk factors you cannot control include:  Race. You may be at higher risk if you are African American.  Age. Risk increases with age.  Gender. Men are at higher risk than women before age 45 years. After age 65, women are at higher risk than men. Risk factors you can control include:  Not getting enough exercise or physical activity.  Being overweight.  Getting too much fat, sugar, calories, or salt in your diet.  Drinking too much alcohol. What are the signs or symptoms? Hypertension does not usually cause signs or symptoms. Extremely high blood pressure (hypertensive crisis) may cause headache, anxiety, shortness of breath, and nosebleed. How is this diagnosed? To check if you have hypertension, your health care provider will measure your blood pressure while you are seated, with your arm held at the level of your heart. It should be measured at least twice using the same arm. Certain conditions can cause a difference in blood pressure between your right and left arms. A blood pressure reading that is higher than normal on one occasion does  not mean that you need treatment. If it is not clear whether you have high blood pressure, you may be asked to return on a different day to have your blood pressure checked again. Or, you may be asked to monitor your blood pressure at home for 1 or more weeks. How is this treated? Treating high blood pressure includes making lifestyle changes and possibly taking medicine. Living a healthy lifestyle can help lower high blood pressure. You may need to change some of your habits. Lifestyle changes may include:  Following the DASH diet. This diet is high in fruits, vegetables, and whole grains. It is low in salt, red meat, and added sugars.  Keep your sodium intake below 2,300 mg per day.  Getting at least 30-45 minutes of aerobic exercise at least 4 times per week.  Losing weight if necessary.  Not smoking.  Limiting alcoholic beverages.  Learning ways to reduce stress. Your health care provider may prescribe medicine if lifestyle changes are not enough to get your blood pressure under control, and if one of the following is true:  You are 18-59 years of age and your systolic blood pressure is above 140.  You are 60 years of age or older, and your systolic blood pressure is above 150.  Your diastolic blood pressure is above 90.  You have diabetes, and your systolic blood pressure is over 140 or your diastolic blood pressure is over 90.  You have kidney disease and your blood pressure is above 140/90.  You have heart disease and your blood pressure is above 140/90. Your personal target blood pressure may vary depending on your medical   conditions, your age, and other factors. Follow these instructions at home:  Have your blood pressure rechecked as directed by your health care provider.  Take medicines only as directed by your health care provider. Follow the directions carefully. Blood pressure medicines must be taken as prescribed. The medicine does not work as well when you skip  doses. Skipping doses also puts you at risk for problems.  Do not smoke.  Monitor your blood pressure at home as directed by your health care provider. Contact a health care provider if:  You think you are having a reaction to medicines taken.  You have recurrent headaches or feel dizzy.  You have swelling in your ankles.  You have trouble with your vision. Get help right away if:  You develop a severe headache or confusion.  You have unusual weakness, numbness, or feel faint.  You have severe chest or abdominal pain.  You vomit repeatedly.  You have trouble breathing. This information is not intended to replace advice given to you by your health care provider. Make sure you discuss any questions you have with your health care provider. Document Released: 11/18/2005 Document Revised: 04/25/2016 Document Reviewed: 09/10/2013 Elsevier Interactive Patient Education  2017 Elsevier Inc.  

## 2016-11-19 NOTE — Progress Notes (Signed)
Subjective:  Patient ID: Caroline Cooper, female    DOB: August 30, 1935  Age: 81 y.o. MRN: [id number]  CC: Hypertension and Diabetes   HPI Caroline Cooper this 81 year old female with a history of type 2 diabetes mellitus (diet controlled with A1c of 6.2), hypertension, presents today for follow-up visit.  Her blood pressure is elevated and she endorses compliance with her antihypertensive. At her last visit amlodipine was discontinued due to pedal edema and dose of carvedilol was increased; she informs me that elevation is due to the fact that she had mistakenly presented at the ophthalmologist's office for an appointment prior to coming here.  Today she complains of a lump in the roof of her mouth which has been present for a few weeks and is requesting prescription of azithromycin which she used in the past which has resolved the lump. Lump is not painful.  She continues to express multiple nighttime awakenings and urinary frequency with no dysuria.  Her son mentions that the patient sometimes tells stories that she has told previously but has a very good long-term memory. On further questioning they both deny memory problems that are concerning and we have discussed the availability of medications that slow progression of dementia as the patient does have a family history of dementia.  Past Medical History:  Diagnosis Date  . Diabetes mellitus without complication ([place])   . Diverticulitis   . DM (diabetes mellitus) ([place]) 09/24/2015  . HTN (hypertension) 09/24/2015  . Hyperlipidemia 09/24/2015  . Hypertension    Past Surgical History:  Procedure Laterality Date  . ABDOMINAL HYSTERECTOMY    . CHOLECYSTECTOMY    . COLON SURGERY     COLECTOMY FOR DIVERTICULITIS  . HERNIA REPAIR      Allergies  Allergen Reactions  . Penicillins     Unknown reaction  Has patient had a PCN reaction causing immediate rash, facial/tongue/throat swelling, SOB or lightheadedness with hypotension: No Has  patient had a PCN reaction causing severe rash involving mucus membranes or skin necrosis: No Has patient had a PCN reaction that required hospitalization No Has patient had a PCN reaction occurring within the last 10 years: No If all of the above answers are "NO", then may proceed with Cephalosporin use.      Outpatient Medications Prior to Visit  Medication Sig Dispense Refill  . atorvastatin (LIPITOR) 40 MG tablet Take 1 tablet (40 mg total) by mouth daily. 90 tablet 1  . carvedilol (COREG) 25 MG tablet Take 1 tablet (25 mg total) by mouth 2 (two) times daily with a meal. 180 tablet 1  . losartan (COZAAR) 100 MG tablet Take 1 tablet (100 mg total) by mouth daily. 90 tablet 1  . Multiple Vitamins-Minerals (CENTRUM ADULTS) TABS Take 1 tablet by mouth daily.    Marland Kitchen olopatadine (PATANOL) 0.1 % ophthalmic solution Place 1 drop into both eyes 2 (two) times daily. 5 mL 2   No facility-administered medications prior to visit.     ROS Review of Systems  Constitutional: Negative for activity change, appetite change and fatigue.  HENT: Negative for congestion, sinus pressure and sore throat.   Eyes: Negative for visual disturbance.  Respiratory: Negative for cough, chest tightness, shortness of breath and wheezing.   Cardiovascular: Negative for chest pain and palpitations.  Gastrointestinal: Negative for abdominal distention, abdominal pain and constipation.  Endocrine: Negative for polydipsia.  Genitourinary: Positive for frequency. Negative for dysuria.  Musculoskeletal: Negative for arthralgias and back pain.  Skin: Negative for rash.  Neurological:  Negative for tremors, light-headedness and numbness.  Hematological: Does not bruise/bleed easily.  Psychiatric/Behavioral: Negative for agitation and behavioral problems.    Objective:  BP (!) 160/80 (BP Location: Right Arm, Patient Position: Sitting, Cuff Size: Small)   Temp 98.6 F (37 C) (Oral)   Ht 5\' 3"  (1.6 m)   Wt 158 lb (71.7  kg)   SpO2 98%   BMI 27.99 kg/m   BP/Weight 11/19/2016 10/08/2016 [id number]  Systolic BP 0000000 [id number]  Diastolic BP 80 70 76  Wt. (Lbs) 158 - 158  BMI 27.99 - 28.9      Physical Exam  Constitutional: She is oriented to person, place, and time. She appears well-developed and well-nourished.  HENT:  Presence of a 2x2 cm lump in hard palate  Neck: No JVD present.  Cardiovascular: Normal rate, normal heart sounds and intact distal pulses.   No murmur heard. Pulmonary/Chest: Effort normal and breath sounds normal. She has no wheezes. She has no rales. She exhibits no tenderness.  Abdominal: Soft. Bowel sounds are normal. She exhibits no distension and no mass. There is no tenderness.  Musculoskeletal: Normal range of motion.  Neurological: She is alert and oriented to person, place, and time.  Skin: Skin is warm and dry.  Psychiatric: She has a normal mood and affect.     Lab Results  Component Value Date   HGBA1C 6.2 09/30/2016    CMP Latest Ref Rng & Units 10/08/2016 10/03/2016 03/14/2016  Glucose 65 - 99 mg/dL 160(H) 122(H) 81  BUN 6 - 20 mg/dL 18 22 16   Creatinine 0.44 - 1.00 mg/dL 0.90 0.93 0.81  Sodium 135 - 145 mmol/L 139 141 141  Potassium 3.5 - 5.1 mmol/L 4.1 4.6 3.9  Chloride 101 - 111 mmol/L 105 104 103  CO2 22 - 32 mmol/L 26 29 28   Calcium 8.9 - 10.3 mg/dL 9.0 9.5 9.5  Total Protein 6.5 - 8.1 g/dL 7.5 7.3 7.7  Total Bilirubin 0.3 - 1.2 mg/dL 0.9 0.8 0.8  Alkaline Phos 38 - 126 U/L 84 84 93  AST 15 - 41 U/L 27 17 16   ALT 14 - 54 U/L 18 11 12     Lipid Panel     Component Value Date/Time   CHOL 146 10/03/2016 0859   TRIG 129 10/03/2016 0859   HDL 48 10/03/2016 0859   CHOLHDL 3.0 10/03/2016 0859   VLDL 26 10/03/2016 0859   LDLCALC 72 10/03/2016 0859    Assessment & Plan:   1. Type 2 diabetes, HbA1c goal < 7% (HCC) Diet controlled with A1c of 6.2 Continue ADA diet - Glucose (CBG)  2. Overactive bladder Discussed [place] exercises Side effects of  oxybutynin discussed We'll try for one month and reassess - oxybutynin (DITROPAN) 5 MG tablet; Take 1 tablet (5 mg total) by mouth 2 (two) times daily.  Dispense: 60 tablet; Refill: 1  3. Essential hypertension Uncontrolled Low-sodium diet - hydrALAZINE (APRESOLINE) 25 MG tablet; Take 1 tablet (25 mg total) by mouth 2 (two) times daily.  Dispense: 60 tablet; Refill: 3  4. Diabetes mellitus due to underlying condition with hyperosmolarity without coma, without long-term current use of insulin ([place])   5. Torus palatinus I have explained to the patient that we could observe this and if it persist refer to oral surgeon to exclude malignancy. Placed on Z-Pak as she insists that this helped resolve lesion in the past. - azithromycin (ZITHROMAX) 250 MG tablet; Take 2 tablets (500 mg) by mouth on day 1  then 1 tablet (250 mg) on days 2-5  Dispense: 6 tablet; Refill: 0   Meds ordered this encounter  Medications  . oxybutynin (DITROPAN) 5 MG tablet    Sig: Take 1 tablet (5 mg total) by mouth 2 (two) times daily.    Dispense:  60 tablet    Refill:  1  . azithromycin (ZITHROMAX) 250 MG tablet    Sig: Take 2 tablets (500 mg) by mouth on day 1 then 1 tablet (250 mg) on days 2-5    Dispense:  6 tablet    Refill:  0  . hydrALAZINE (APRESOLINE) 25 MG tablet    Sig: Take 1 tablet (25 mg total) by mouth 2 (two) times daily.    Dispense:  60 tablet    Refill:  3    Follow-up: Return in about 1 month (around 12/20/2016) for follow up on Hypertension.   Arnoldo Morale MD

## 2016-11-30 ENCOUNTER — Other Ambulatory Visit: Payer: Self-pay | Admitting: Family Medicine

## 2016-11-30 DIAGNOSIS — E785 Hyperlipidemia, unspecified: Secondary | ICD-10-CM

## 2016-12-23 ENCOUNTER — Ambulatory Visit: Payer: Medicare Other | Attending: Family Medicine | Admitting: Family Medicine

## 2016-12-23 ENCOUNTER — Encounter: Payer: Self-pay | Admitting: Family Medicine

## 2016-12-23 VITALS — BP 136/75 | HR 62 | Temp 98.2°F | Ht 62.0 in | Wt 158.8 lb

## 2016-12-23 DIAGNOSIS — I10 Essential (primary) hypertension: Secondary | ICD-10-CM

## 2016-12-23 DIAGNOSIS — Z9049 Acquired absence of other specified parts of digestive tract: Secondary | ICD-10-CM | POA: Diagnosis not present

## 2016-12-23 DIAGNOSIS — N3281 Overactive bladder: Secondary | ICD-10-CM | POA: Insufficient documentation

## 2016-12-23 DIAGNOSIS — Z90710 Acquired absence of both cervix and uterus: Secondary | ICD-10-CM | POA: Diagnosis not present

## 2016-12-23 DIAGNOSIS — E119 Type 2 diabetes mellitus without complications: Secondary | ICD-10-CM | POA: Diagnosis not present

## 2016-12-23 DIAGNOSIS — E0800 Diabetes mellitus due to underlying condition with hyperosmolarity without nonketotic hyperglycemic-hyperosmolar coma (NKHHC): Secondary | ICD-10-CM | POA: Diagnosis not present

## 2016-12-23 DIAGNOSIS — E870 Hyperosmolality and hypernatremia: Secondary | ICD-10-CM | POA: Diagnosis not present

## 2016-12-23 DIAGNOSIS — Z79899 Other long term (current) drug therapy: Secondary | ICD-10-CM | POA: Insufficient documentation

## 2016-12-23 LAB — GLUCOSE, POCT (MANUAL RESULT ENTRY): POC GLUCOSE: 114 mg/dL — AB (ref 70–99)

## 2016-12-23 MED ORDER — OXYBUTYNIN CHLORIDE 5 MG PO TABS: 5.0000 mg | ORAL_TABLET | Freq: Two times a day (BID) | ORAL | 3 refills | Status: DC

## 2016-12-23 NOTE — Patient Instructions (Signed)
Vejiga hiperactiva en adultos (Overactive Bladder, Adult) El trastorno de vejiga hiperactiva es un grupo de sntomas urinarios. Si tiene vejiga hiperactiva, puede sentir la necesidad repentina de orinar de inmediato. Despus de sentir esta necesidad urgente, tambin puede tener prdida de orina si no puede llegar al bao con la rapidez suficiente (incontinencia urinaria). Estos sntomas pueden interferir con su trabajo diario y las actividades sociales. Adems, los sntomas de vejiga hiperactiva pueden despertarlo durante la noche. El trastorno de vejiga hiperactiva afecta las seales nerviosas entre la vejiga y el cerebro. La vejiga puede recibir la seal de vaciarse antes de que est llena. Los msculos muy sensibles tambin pueden hacer que pierda orina demasiado pronto. CAUSAS Las causas de la vejiga hiperactiva pueden ser varias: Las causas posibles son las siguientes:  Infeccin urinaria.  Infeccin de los tejidos cercanos, como la prstata.  Agrandamiento de la prstata.  Estar embarazada de ms de un beb (embarazo mltiple).  Ciruga en el tero o la uretra.  Clculos en la vejiga, inflamacin o tumores.  Consumir cafena o alcohol en exceso.  Ciertos medicamentos, en especial lo que se toman para ayudar al organismo a eliminar el lquido extra (diurticos) al aumentar la produccin de [place].  Debilidad de los msculos y nervios, especialmente a causa de lo siguiente:  Lesin en la mdula espinal.  Ictus.  Esclerosis mltiple.  La enfermedad de Parkinson.  Diabetes. Esto puede producir un volumen de orina elevado que llena la vejiga tan rpido que la necesidad urgente de Garment/textile technologist se desencadena en forma muy intensa.  Estreimiento. La acumulacin de demasiada cantidad de heces puede ejercer presin en la vejiga. FACTORES DE RIESGO Puede correr un mayor riesgo de desarrollar vejiga hiperactiva si usted:  Es Media planner.  Fuma.  Est atravesando la  menopausia.  Tiene problemas de prstata.  Tiene una enfermedad neurolgica, como ictus, demencia, enfermedad de Parkinson o esclerosis mltiple (EM).  Ingiere alimentos o bebidas que irritan la vejiga. Entre ellos se incluyen el alcohol, los alimentos picantes y la cafena.  Tiene sobrepeso o es obeso. SIGNOS Y SNTOMAS [employer] signos y los sntomas de vejiga hiperactiva se incluyen los siguientes:  Urgencia repentina e intensa de Garment/textile technologist.  Prdida de [place].  Orinar ocho o ms veces por da.  Despertarse dos o ms veces durante la noche para Garment/textile technologist. DIAGNSTICO El mdico puede sospechar la presencia de vejiga hiperactiva en funcin de los sntomas que [place]. El [employer] har un examen fsico y revisar su historia clnica. Pueden realizarle anlisis de [place] o de [place]. Por ejemplo, puede ser necesario realizar pruebas de la funcin de la vejiga para controlar la retencin de [place]. Es posible que tambin tenga que consultar a un mdico especialista en vas urinarias (urlogo). [place] para el trastorno de vejiga hiperactiva depende de la causa y la gravedad de su enfermedad. Ciertos tratamientos pueden Associate Professor del mdico o en la clnica. Tambin puede hacer cambios en su estilo de vida en su casa. [place] opciones se incluyen las siguientes: Tratamientos Regulatory affairs officer. El especialista utiliza sensores para ayudarlo a Personnel officer atento a las seales del cuerpo.  Llevar un registro diario de los momentos en que necesita orinar y qu sucede despus de la necesidad urgente de Garment/textile technologist. Esto puede ayudarlo a Electrical engineer.  Entrenamiento de la vejiga. Esto lo ayuda a aprender a Aeronautical engineer necesidad urgente de Garment/textile technologist al seguir un programa que lo obliga a Garment/textile technologist en intervalos regulares (  vaciamiento cronometrado). Al principio, es posible que tenga que esperar unos minutos despus de sentir la necesidad urgente de  Garment/textile technologist. Con el tiempo, debera poder [employer] con una hora de diferencia o ms.  Ejercicios de Kegel. Son ejercicios para fortalecer los msculos del piso plvico que sostienen la vejiga. La tonificacin de estos msculos puede ayudarlo a Chief Technology Officer las micciones, aun si hay hiperactividad en los msculos de la vejiga. Un especialista le ensear cmo hacer estos ejercicios en forma correcta. Deber practicarlos diariamente.  Prdida de peso. Si es obeso o tiene sobrepeso, perder [employer] sntomas de vejiga hiperactiva. Hable con su mdico acerca de cmo perder peso y si hay algn programa o mtodo especfico ms eficaz para usted.  Cambio en la dieta. Esto podra ayudar si el estreimiento empeora el trastorno de vejiga hiperactiva. El mdico o nutricionista puede explicarle de qu forma puede hacer cambios en su dieta para Theatre stage manager estreimiento. Tambin es posible que tenga que consumir menos cantidad de alcohol y cafena, y beber otros lquidos en distintos momentos del da.  Dejar de fumar.  Usar apsitos para Tax adviser las prdidas mientras espera que otros tratamientos surtan [place]. Tratamientos fsicos   Estimulacin elctrica. Los electrodos envan pulsos elctricos suaves para [employer] nervios o los msculos que ayudan a Chief Technology Officer la vejiga. En algunos casos, los electrodos se colocan fuera del cuerpo. En otros casos, pueden colocarse en el interior del cuerpo (implante). Este tratamiento puede demorar varios meses en surtir [employer].  Dispositivos complementarios. Las mujeres pueden necesitar un dispositivo de plstico que calce en la vagina y sostenga la vejiga (pesario). Medicamentos  Varios medicamentos pueden ayudar a Chiropodist trastorno de vejiga hiperactiva y por lo general se utilizan junto con otros medicamentos. Algunos se inyectan en los msculos que participan en la miccin. Otros vienen en comprimidos. El mdico tambin puede indicarle lo  siguiente:  Antiespasmdicos. Estos medicamentos bloquean las seales que los nervios envan a la vejiga. Esto evita que la vejiga elimine orina en el momento incorrecto.  Antidepresivos tricclicos. Estos tipos de antidepresivos tambin [employer] de la vejiga. Ciruga   Puede implantarse un dispositivo que ayuda a Chief Technology Officer las seales nerviosas que indican cundo debe orinar.  Puede someterse a una ciruga de implante de electrodos para recibir Ship broker.  A veces, los casos muy graves de vejiga hiperactiva requieren de una ciruga para cambiar la forma de la vejiga. [place] los medicamentos solamente como se lo haya indicado el mdico.  Use los implantes o un pesario como se lo haya indicado el mdico.  Modifique su dieta o estilo de vida como se lo haya recomendado su mdico. Estos pueden incluir los siguientes:  Electronics engineer menos cantidad de lquido o beber en distintos momentos del Training and development officer. Si necesita orinar con frecuencia por las noches, es posible que tenga que dejar de beber lquidos apenas comienza la noche.  Reduzca la ingesta de cafena o alcohol. Ambos pueden empeorar el trastorno de vejiga hiperactiva. La cafena se encuentra en el caf, el t y los refrescos.  Haga ejercicios de Kegel para fortalecer los msculos.  Pierda peso si lo necesita.  Ingiera una dieta saludable y equilibrada para English as a second language teacher estreimiento.  Lleve un diario o libro de anotaciones para registrar la cantidad de lquidos que ingiere y cundo lo hace, y [place] cundo siente necesidad de Garment/textile technologist. Esto ayudar a su mdico a Administrator, arts. [place]  SI:  Los sntomas no mejoran despus de Chiropodist.  El dolor y [place].  Tiene necesidad urgente de orinar con mayor frecuencia.  Tiene fiebre. SOLICITE ATENCIN MDICA DE INMEDIATO SI: No puede controlar la vejiga para nada. Esta  informacin no tiene Marine scientist el consejo del mdico. Asegrese de hacerle al mdico cualquier pregunta que tenga. Document Released: 11/04/2012 Document Revised: 12/09/2014 Document Reviewed: 04/13/2014 Elsevier Interactive Patient Education  2017 [employer].

## 2016-12-23 NOTE — Progress Notes (Signed)
Subjective:  Patient ID: Caroline Cooper, female    DOB: 06-16-1935  Age: 82 y.o. MRN: [id number]  CC: Diabetes; Hypertension; Anxiety (increasing lately); and Urinary Frequency (still waking up 4 times nighly- meds didn't work)   HPI Caroline Cooper is a 82 year old female with a history of hypertension, type 2 diabetes mellitus (A1c 6.2), overactive bladder who comes in today for follow-up of her blood pressure.  Hydralazine was added at last office visit with resulting improvement in blood pressure but she complains of lightheadedness and so has been taking half of 25 mg carvedilol pill.  She was also commenced on oxybutynin due to overactive bladder but still reports increased urinary frequency and nocturia with slight improvement in symptoms. She denies dysuria or hematuria.  Past Medical History:  Diagnosis Date  . Diabetes mellitus without complication ([place])   . Diverticulitis   . DM (diabetes mellitus) ([place]) 09/24/2015  . HTN (hypertension) 09/24/2015  . Hyperlipidemia 09/24/2015  . Hypertension     Past Surgical History:  Procedure Laterality Date  . ABDOMINAL HYSTERECTOMY    . CHOLECYSTECTOMY    . COLON SURGERY     COLECTOMY FOR DIVERTICULITIS  . HERNIA REPAIR      Allergies  Allergen Reactions  . Penicillins     Unknown reaction  Has patient had a PCN reaction causing immediate rash, facial/tongue/throat swelling, SOB or lightheadedness with hypotension: No Has patient had a PCN reaction causing severe rash involving mucus membranes or skin necrosis: No Has patient had a PCN reaction that required hospitalization No Has patient had a PCN reaction occurring within the last 10 years: No If all of the above answers are "NO", then may proceed with Cephalosporin use.      Outpatient Medications Prior to Visit  Medication Sig Dispense Refill  . atorvastatin (LIPITOR) 40 MG tablet Take 1 tablet (40 mg total) by mouth daily. 90 tablet 1  . carvedilol (COREG) 25 MG  tablet Take 1 tablet (25 mg total) by mouth 2 (two) times daily with a meal. 180 tablet 1  . hydrALAZINE (APRESOLINE) 25 MG tablet Take 1 tablet (25 mg total) by mouth 2 (two) times daily. 60 tablet 3  . losartan (COZAAR) 100 MG tablet Take 1 tablet (100 mg total) by mouth daily. 90 tablet 1  . Multiple Vitamins-Minerals (CENTRUM ADULTS) TABS Take 1 tablet by mouth daily.    Marland Kitchen oxybutynin (DITROPAN) 5 MG tablet Take 1 tablet (5 mg total) by mouth 2 (two) times daily. 60 tablet 1  . olopatadine (PATANOL) 0.1 % ophthalmic solution Place 1 drop into both eyes 2 (two) times daily. (Patient not taking: Reported on 12/23/2016) 5 mL 2  . azithromycin (ZITHROMAX) 250 MG tablet Take 2 tablets (500 mg) by mouth on day 1 then 1 tablet (250 mg) on days 2-5 6 tablet 0   No facility-administered medications prior to visit.     ROS Review of Systems  Constitutional: Negative for activity change and appetite change.  HENT: Negative for sinus pressure and sore throat.   Respiratory: Negative for chest tightness, shortness of breath and wheezing.   Cardiovascular: Negative for chest pain and palpitations.  Gastrointestinal: Negative for abdominal distention, abdominal pain and constipation.  Genitourinary:       See hpi  Musculoskeletal: Negative.   Neurological: Positive for light-headedness.  Psychiatric/Behavioral: Negative for behavioral problems and dysphoric mood.    Objective:  BP 136/75 (BP Location: Right Arm, Patient Position: Sitting, Cuff Size: Small)   Pulse 62  Temp 98.2 F (36.8 C) (Oral)   Ht 5\' 2"  (1.575 m)   Wt 158 lb 12.8 oz (72 kg)   SpO2 98%   BMI 29.04 kg/m   BP/Weight 12/23/2016 11/19/2016 123XX123  Systolic BP [id number]  Diastolic BP 75 80 70  Wt. (Lbs) 158.8 158 -  BMI 29.04 27.99 -      Physical Exam  Constitutional: She is oriented to person, place, and time. She appears well-developed and well-nourished.  Cardiovascular: Normal rate, normal heart sounds and  intact distal pulses.   No murmur heard. Pulmonary/Chest: Effort normal and breath sounds normal. She has no wheezes. She has no rales. She exhibits no tenderness.  Abdominal: Soft. Bowel sounds are normal. She exhibits no distension and no mass. There is no tenderness.  Musculoskeletal: Normal range of motion.  Neurological: She is alert and oriented to person, place, and time.     Lab Results  Component Value Date   HGBA1C 6.2 09/30/2016    Assessment & Plan:   1. Diabetes mellitus due to underlying condition with hyperosmolarity without coma, without long-term current use of insulin (HCC) Diet controlled with A1c of 6.2 - Glucose (CBG)  2. Overactive bladder Uncontrolled After discussing with the patient we will need to give this some time as she was recently placed on oxybutynin Kegel exercises - oxybutynin (DITROPAN) 5 MG tablet; Take 1 tablet (5 mg total) by mouth 2 (two) times daily.  Dispense: 60 tablet; Refill: 3  3. Essential hypertension Improved Due to complaints of lightheadedness advised to keep blood pressure log and hold off on hydralazine if blood pressures are controlled without it Low-sodium diet   Meds ordered this encounter  Medications  . oxybutynin (DITROPAN) 5 MG tablet    Sig: Take 1 tablet (5 mg total) by mouth 2 (two) times daily.    Dispense:  60 tablet    Refill:  3    Follow-up: Return in about 3 months (around 03/23/2017) for Follow-up on hypertension and Diabetes.   Arnoldo Morale MD

## 2017-01-13 DIAGNOSIS — Z961 Presence of intraocular lens: Secondary | ICD-10-CM | POA: Diagnosis not present

## 2017-01-13 DIAGNOSIS — H43813 Vitreous degeneration, bilateral: Secondary | ICD-10-CM | POA: Diagnosis not present

## 2017-01-13 DIAGNOSIS — H10413 Chronic giant papillary conjunctivitis, bilateral: Secondary | ICD-10-CM | POA: Diagnosis not present

## 2017-01-13 DIAGNOSIS — H02831 Dermatochalasis of right upper eyelid: Secondary | ICD-10-CM | POA: Diagnosis not present

## 2017-01-13 DIAGNOSIS — H40013 Open angle with borderline findings, low risk, bilateral: Secondary | ICD-10-CM | POA: Diagnosis not present

## 2017-01-13 DIAGNOSIS — H401122 Primary open-angle glaucoma, left eye, moderate stage: Secondary | ICD-10-CM | POA: Diagnosis not present

## 2017-01-13 DIAGNOSIS — H02834 Dermatochalasis of left upper eyelid: Secondary | ICD-10-CM | POA: Diagnosis not present

## 2017-03-14 ENCOUNTER — Ambulatory Visit: Payer: Medicare Other | Attending: Family Medicine | Admitting: Physician Assistant

## 2017-03-14 VITALS — BP 125/75 | HR 59 | Temp 98.3°F | Wt 157.4 lb

## 2017-03-14 DIAGNOSIS — E785 Hyperlipidemia, unspecified: Secondary | ICD-10-CM | POA: Insufficient documentation

## 2017-03-14 DIAGNOSIS — R432 Parageusia: Secondary | ICD-10-CM | POA: Diagnosis not present

## 2017-03-14 DIAGNOSIS — E119 Type 2 diabetes mellitus without complications: Secondary | ICD-10-CM | POA: Insufficient documentation

## 2017-03-14 DIAGNOSIS — E870 Hyperosmolality and hypernatremia: Secondary | ICD-10-CM | POA: Diagnosis not present

## 2017-03-14 DIAGNOSIS — I10 Essential (primary) hypertension: Secondary | ICD-10-CM | POA: Insufficient documentation

## 2017-03-14 DIAGNOSIS — Z79899 Other long term (current) drug therapy: Secondary | ICD-10-CM | POA: Diagnosis not present

## 2017-03-14 DIAGNOSIS — Z880 Allergy status to penicillin: Secondary | ICD-10-CM | POA: Insufficient documentation

## 2017-03-14 DIAGNOSIS — E0800 Diabetes mellitus due to underlying condition with hyperosmolarity without nonketotic hyperglycemic-hyperosmolar coma (NKHHC): Secondary | ICD-10-CM

## 2017-03-14 LAB — POCT GLYCOSYLATED HEMOGLOBIN (HGB A1C): HEMOGLOBIN A1C: 5.9

## 2017-03-14 LAB — POCT CBG (FASTING - GLUCOSE)-MANUAL ENTRY: Glucose Fasting, POC: 136 mg/dL — AB (ref 70–99)

## 2017-03-14 NOTE — Progress Notes (Signed)
Caroline Cooper, is a 82 y.o. female  [id number]  [id number]  DOB - [birth year]  Subjective:  Chief Complaint and HPI: Caroline Cooper is a 82 y.o. female here today for 1 month h/o persistent salty taste in mouth.  No precipitating event.  Not using nasal sprays or any ne OTC meds or supplements/vitamins.  Diabetes is controlled.  No dysphagia or change in appetite. No cough/night sweats.  Food doesn't taste right because it tastes too salty.   ROS:   Constitutional:  No f/c, No night sweats, No unexplained weight loss. EENT:  No vision changes, No blurry vision, No hearing changes. No mouth, throat, or ear problems other than stated above.  Respiratory: No cough, No SOB Cardiac: No CP, no palpitations GI:  No abd pain, No N/V/D. GU: No Urinary s/sx Musculoskeletal: No joint pain Neuro: No headache, no dizziness, no motor weakness.  Skin: No rash Endocrine:  No polydipsia. No polyuria.  Psych: Denies SI/HI  No problems updated.  ALLERGIES: Allergies  Allergen Reactions  . Penicillins     Unknown reaction  Has patient had a PCN reaction causing immediate rash, facial/tongue/throat swelling, SOB or lightheadedness with hypotension: No Has patient had a PCN reaction causing severe rash involving mucus membranes or skin necrosis: No Has patient had a PCN reaction that required hospitalization No Has patient had a PCN reaction occurring within the last 10 years: No If all of the above answers are "NO", then may proceed with Cephalosporin use.     PAST MEDICAL HISTORY: Past Medical History:  Diagnosis Date  . Diabetes mellitus without complication ([place])   . Diverticulitis   . DM (diabetes mellitus) ([place]) 09/24/2015  . HTN (hypertension) 09/24/2015  . Hyperlipidemia 09/24/2015  . Hypertension     MEDICATIONS AT HOME: Prior to Admission medications   Medication Sig Start Date End Date Taking? Authorizing Provider  atorvastatin (LIPITOR) 40 MG tablet Take 1 tablet (40  mg total) by mouth daily. 09/30/16   Arnoldo Morale, MD  carvedilol (COREG) 25 MG tablet Take 1 tablet (25 mg total) by mouth 2 (two) times daily with a meal. 09/30/16   Arnoldo Morale, MD  hydrALAZINE (APRESOLINE) 25 MG tablet Take 1 tablet (25 mg total) by mouth 2 (two) times daily. 11/19/16   Arnoldo Morale, MD  losartan (COZAAR) 100 MG tablet Take 1 tablet (100 mg total) by mouth daily. 09/30/16   Arnoldo Morale, MD  Multiple Vitamins-Minerals (CENTRUM ADULTS) TABS Take 1 tablet by mouth daily.    Historical Provider, MD  olopatadine (PATANOL) 0.1 % ophthalmic solution Place 1 drop into both eyes 2 (two) times daily. Patient not taking: Reported on 12/23/2016 03/14/16   Arnoldo Morale, MD  oxybutynin (DITROPAN) 5 MG tablet Take 1 tablet (5 mg total) by mouth 2 (two) times daily. 12/23/16   Arnoldo Morale, MD     Objective:  EXAM:   Vitals:   03/14/17 1036  BP: 125/75  Pulse: (!) 59  Temp: 98.3 F (36.8 C)  TempSrc: Oral  SpO2: 98%  Weight: 157 lb 6.4 oz (71.4 kg)    General appearance : A&OX3. NAD. Non-toxic-appearing HEENT: Atraumatic and Normocephalic.  PERRLA. EOM intact.  TM clear B. Mouth-MMM, post pharynx WNL w/o erythema, No PND. Torus palatinus present and prominent with regular and smooth surface.  Tongue strength and symmetry are =B.  Parotid glasnds non-tender and not enlarged. Neck: supple, no JVD. No cervical lymphadenopathy. No thyromegaly Chest/Lungs:  Breathing-non-labored, Good air entry bilaterally, breath sounds normal  without rales, rhonchi, or wheezing  CVS: S1 S2 regular, no murmurs, gallops, rubs  Extremities: Bilateral Lower Ext shows no edema, both legs are warm to touch with = pulse throughout Neurology:  CN II-XII grossly intact, Non focal.   Psych:  TP linear. J/I WNL. Normal speech. Appropriate eye contact and affect.  Skin:  No Rash  Data Review Lab Results  Component Value Date   HGBA1C 5.9 03/14/2017   HGBA1C 6.2 09/30/2016   HGBA1C 6.1 06/20/2016      Assessment & Plan   1. Diabetes mellitus due to underlying condition with hyperosmolarity without coma, without long-term current use of insulin (HCC) Controlled.  Continue current regimen - Glucose (CBG), Fasting - POCT glycosylated hemoglobin (Hb A1C)  2. Essential hypertension Controlled.  Continue current regimen  3. Altered taste Unsure etiology. No neurological findings. - Ambulatory referral to ENT  Patient have been counseled extensively about nutrition and exercise  Return in about 3 months (around 06/13/2017) for Dr Jarold Song; DM and htn.  The patient was given clear instructions to go to ER or return to medical center if symptoms don't improve, worsen or new problems develop. The patient verbalized understanding. The patient was told to call to get lab results if they haven't heard anything in the next week.     Freeman Caldron, PA-C [facility] [place], [place]   03/14/2017, 10:49 AMPatient ID: Caroline Cooper, female   DOB: 29-Dec-1934, 82 y.o.   MRN: [id number]

## 2017-03-16 ENCOUNTER — Encounter (HOSPITAL_COMMUNITY): Payer: Self-pay | Admitting: Obstetrics and Gynecology

## 2017-03-16 ENCOUNTER — Emergency Department (HOSPITAL_COMMUNITY)
Admission: EM | Admit: 2017-03-16 | Discharge: 2017-03-16 | Disposition: A | Discharge status: Home / Self Care | Payer: Medicare Other | Attending: Emergency Medicine | Admitting: Emergency Medicine

## 2017-03-16 DIAGNOSIS — E119 Type 2 diabetes mellitus without complications: Secondary | ICD-10-CM | POA: Diagnosis not present

## 2017-03-16 DIAGNOSIS — I10 Essential (primary) hypertension: Secondary | ICD-10-CM | POA: Insufficient documentation

## 2017-03-16 DIAGNOSIS — R432 Parageusia: Secondary | ICD-10-CM

## 2017-03-16 DIAGNOSIS — R439 Unspecified disturbances of smell and taste: Secondary | ICD-10-CM | POA: Insufficient documentation

## 2017-03-16 LAB — CBC WITH DIFFERENTIAL/PLATELET
BASOS ABS: 0 10*3/uL (ref 0.0–0.1)
BASOS PCT: 1 %
EOS PCT: 3 %
Eosinophils Absolute: 0.2 10*3/uL (ref 0.0–0.7)
HCT: 38.8 % (ref 36.0–46.0)
Hemoglobin: 13.2 g/dL (ref 12.0–15.0)
LYMPHS PCT: 42 %
Lymphs Abs: 3.3 10*3/uL (ref 0.7–4.0)
MCH: 31.4 pg (ref 26.0–34.0)
MCHC: 34 g/dL (ref 30.0–36.0)
MCV: 92.4 fL (ref 78.0–100.0)
Monocytes Absolute: 0.8 10*3/uL (ref 0.1–1.0)
Monocytes Relative: 10 %
Neutro Abs: 3.6 10*3/uL (ref 1.7–7.7)
Neutrophils Relative %: 44 %
PLATELETS: 159 10*3/uL (ref 150–400)
RBC: 4.2 MIL/uL (ref 3.87–5.11)
RDW: 13.6 % (ref 11.5–15.5)
WBC: 7.9 10*3/uL (ref 4.0–10.5)

## 2017-03-16 LAB — BASIC METABOLIC PANEL
ANION GAP: 10 (ref 5–15)
BUN: 17 mg/dL (ref 6–20)
CO2: 25 mmol/L (ref 22–32)
Calcium: 9.3 mg/dL (ref 8.9–10.3)
Chloride: 105 mmol/L (ref 101–111)
Creatinine, Ser: 0.92 mg/dL (ref 0.44–1.00)
GFR calc Af Amer: >60 mL/min (ref >60)
GFR, EST NON AFRICAN AMERICAN: 58 mL/min — AB (ref >60)
Glucose, Bld: 121 mg/dL — ABNORMAL HIGH (ref 65–99)
POTASSIUM: 4.2 mmol/L (ref 3.5–5.1)
SODIUM: 140 mmol/L (ref 135–145)

## 2017-03-16 LAB — TSH: TSH: 0.579 u[IU]/mL (ref 0.350–4.500)

## 2017-03-16 LAB — VITAMIN B12: VITAMIN B 12: 890 pg/mL (ref 180–914)

## 2017-03-16 LAB — T4, FREE: FREE T4: 0.83 ng/dL (ref 0.61–1.12)

## 2017-03-16 NOTE — ED Triage Notes (Signed)
Pt presents to the ED with c/o a "salty tongue". She states she constantly tastes salt and has been for approximately a month. She says it is very uncomfortable but she is not currently in any pain.

## 2017-03-16 NOTE — ED Provider Notes (Signed)
[facility] DEPT Provider Note   CSN: [id number] Arrival date & time: 03/16/17  1328  By signing my name below, I, Jeanell Sparrow, attest that this documentation has been prepared under the direction and in the presence of non-physician practitioner, Kinnie Feil, PA-C. Electronically Signed: Jeanell Sparrow, Scribe. 03/16/2017. 2:30 PM.  History   Chief Complaint Chief Complaint  Patient presents with  . Dental Pain   The history is provided by the patient. No language interpreter was used.   HPI Comments: Caroline Cooper is a 82 y.o. female with a PMHx of HTN and DM who presents to the Emergency Department complaining of intermittent moderate "salty taste" that started about a month ago, associated symptoms include intermittent sensation of "being off balance" and blurred vision. She was seen by her PCP 2 days ago for the same complaint without any diagnosis, she was advised to f/u with ENT. Her abnormal taste with associated blurry vision and imbalance sensation have become more frequent. Patient states that the salty taste is not there when she is eating or drinking, but occurs when she is not eating/drinking anything.  She admits to a hx of seasonal allergies. Denies any hx of cancer, head trauma, preceding viral illness, h/o sinus disease or polyps, hx of cigarette/alcohol use, recent dental procedures, recent medication changes, fever, nasal congestion, rhinorrhea, changes in smell, trouble hearing, dental pain, dry mouth, or other complaints at this time.  PCP: Arnoldo Morale, MD  Past Medical History:  Diagnosis Date  . Diabetes mellitus without complication ([place])   . Diverticulitis   . DM (diabetes mellitus) ([place]) 09/24/2015  . HTN (hypertension) 09/24/2015  . Hyperlipidemia 09/24/2015  . Hypertension     Patient Active Problem List   Diagnosis Date Noted  . Overactive bladder 11/19/2016  . 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 Seasonal allergies 03/14/2016  . Need for  vaccination for H flu type B with pedvaxHIB 10/03/2015  . Neck pain   . Type 2 diabetes, HbA1c goal < 7% ([facility])   . Elevated troponin 09/24/2015  . Neck pain on right side 09/24/2015  . Pain of right upper extremity 09/24/2015  . HTN (hypertension) 09/24/2015  . DM (diabetes mellitus) ([place]) 09/24/2015  . Hyperlipidemia 09/24/2015  . Leukocytosis 09/24/2015  . Colitis:/ DIAGNOSED 09/18/2015 09/24/2015    Past Surgical History:  Procedure Laterality Date  . ABDOMINAL HYSTERECTOMY    . CHOLECYSTECTOMY    . COLON SURGERY     COLECTOMY FOR DIVERTICULITIS  . HERNIA REPAIR      OB History    No data available       Home Medications    Prior to Admission medications   Medication Sig Start Date End Date Taking? Authorizing Provider  atorvastatin (LIPITOR) 40 MG tablet Take 1 tablet (40 mg total) by mouth daily. 09/30/16   Arnoldo Morale, MD  carvedilol (COREG) 25 MG tablet Take 1 tablet (25 mg total) by mouth 2 (two) times daily with a meal. 09/30/16   Arnoldo Morale, MD  hydrALAZINE (APRESOLINE) 25 MG tablet Take 1 tablet (25 mg total) by mouth 2 (two) times daily. 11/19/16   Arnoldo Morale, MD  losartan (COZAAR) 100 MG tablet Take 1 tablet (100 mg total) by mouth daily. 09/30/16   Arnoldo Morale, MD  Multiple Vitamins-Minerals (CENTRUM ADULTS) TABS Take 1 tablet by mouth daily.    Historical Provider, MD  olopatadine (PATANOL) 0.1 % ophthalmic solution Place 1 drop into both eyes 2 (two) times daily. Patient not taking: Reported on  12/23/2016 03/14/16   Arnoldo Morale, MD  oxybutynin (DITROPAN) 5 MG tablet Take 1 tablet (5 mg total) by mouth 2 (two) times daily. 12/23/16   Arnoldo Morale, MD    Family History History reviewed. No pertinent family history.  Social History Social History  Substance Use Topics  . Smoking status: Never Smoker  . Smokeless tobacco: Never Used  . Alcohol use No     Allergies   Penicillins   Review of Systems Review of Systems  Constitutional: Negative  for fever.  HENT: Negative for congestion (Nasal), dental problem and hearing loss.        +abnormal taste (salty)  Eyes: Positive for visual disturbance (Blurry).  Neurological:       +imbalance sensation     Physical Exam Updated Vital Signs BP (!) 152/64 (BP Location: Left Arm)   Pulse 66   Temp 98.1 F (36.7 C) (Oral)   Resp 14   Wt 157 lb (71.2 kg)   SpO2 100%   BMI 28.72 kg/m   Physical Exam  Constitutional: She is oriented to person, place, and time. She appears well-developed and well-nourished. She is cooperative.  Non-toxic appearance. No distress.  HENT:  Head: Normocephalic and atraumatic.  Right Ear: Tympanic membrane, external ear and ear canal normal. Tympanic membrane is not erythematous.  Left Ear: External ear and ear canal normal. Tympanic membrane is not erythematous.  Nose: Nose normal. No mucosal edema or rhinorrhea. Right sinus exhibits no maxillary sinus tenderness and no frontal sinus tenderness. Left sinus exhibits no maxillary sinus tenderness and no frontal sinus tenderness.  Mouth/Throat: Uvula is midline, oropharynx is clear and moist and mucous membranes are normal. No uvula swelling. No posterior oropharyngeal edema or posterior oropharyngeal erythema. Tonsils are 1+ on the right. Tonsils are 1+ on the left.  Eyes: Conjunctivae, EOM and lids are normal. Pupils are equal, round, and reactive to light. Lids are everted and swept, no foreign bodies found. Right eye exhibits no chemosis. Left eye exhibits no chemosis. Right conjunctiva is not injected. Left conjunctiva is not injected. No scleral icterus.  Cardiovascular: Normal rate, regular rhythm, S1 normal, S2 normal, normal heart sounds and intact distal pulses.   No murmur heard. Pulmonary/Chest: Effort normal and breath sounds normal. She has no decreased breath sounds. She has no wheezes.  Musculoskeletal: Normal range of motion. She exhibits no deformity.  Neurological: She is alert and oriented  to person, place, and time.  Pt is alert and oriented.   Speech and phonation normal.   Thought process coherent.   Strength 5/5 in upper and lower extremities.   Sensation to light touch intact in upper and lower extremities.  Gait normal.   Negative Romberg. No leg drift.  Intact finger to nose test. CN I not tested CN II full visual fields  CN III, IV, VI PEERL and EOM intact CN V light touch intact in all 3 divisions of trigeminal nerve CN VII facial nerve movements intact, symmetric CN VIII hearing intact to finger rub CN IX, X no uvula deviation, symmetric soft palate rise CN XI 5/5 SCM and trapezius strength  CN XII Tongue midline with symmetric L/R movement  Skin: Skin is warm and dry. Capillary refill takes less than 2 seconds.  Psychiatric: She has a normal mood and affect. Her behavior is normal. Judgment and thought content normal.  Nursing note and vitals reviewed.    ED Treatments / Results  DIAGNOSTIC STUDIES: Oxygen Saturation is 100% on RA, normal  by my interpretation.    COORDINATION OF CARE: 2:34 PM- Pt advised of plan for treatment and pt agrees.  Labs (all labs ordered are listed, but only abnormal results are displayed) Labs Reviewed  BASIC METABOLIC PANEL - Abnormal; Notable for the following:       Result Value   Glucose, Bld 121 (*)    GFR calc non Af Amer 58 (*)    All other components within normal limits  CBC WITH DIFFERENTIAL/PLATELET  TSH  VITAMIN B12  T4, FREE  ZINC    EKG  EKG Interpretation None       Radiology No results found.  Procedures Procedures (including critical care time)  Medications Ordered in ED Medications - No data to display   Initial Impression / Assessment and Plan / ED Course  I have reviewed the triage vital signs and the nursing notes.  Pertinent labs & imaging results that were available during my care of the patient were reviewed by me and considered in my medical decision making (see chart for  details).  Clinical Course as of Mar 16 1729  Sun Mar 16, 2017  1726 Sodium: 140 [CG]  1726 Potassium: 4.2 [CG]  1726 Chloride: 105 [CG]  1726 Glucose: (!) 121 [CG]  1726 BUN: 17 [CG]  1726 Creatinine: 0.92 [CG]  1726 TSH: 0.579 [CG]  1726 WBC: 7.9 [CG]  1727 Hemoglobin: 13.2 [CG]  1727 Temp: 98.1 F (36.7 C) [CG]  1727 Pulse Rate: 66 [CG]  1727 BP: (!) 152/64 [CG]  1727 Resp: 14 [CG]  1727 SpO2: 100 % [CG]    Clinical Course User Index [CG] Kinnie Feil, PA-C   82 year old female with history of well controlled non-insulin-dependent type 2 diabetes mellitus and hypertension presents to the emergency department reporting intermittent in salty taste in her mouth that started one month ago and has been progressively becoming more frequent. Associated symptoms include intermittent blurred vision and sensation of being off balance. On exam patient has normal neurological exam. She is nontoxic appearing. Electrolytes are within normal limits. Kidney function normal. No leukocytosis or anemia. Vital signs are within normal limits. Thyroid panel, vitamin B12 and zinc are pending. Unclear etiology. No recent head trauma or injuries, preceding viral infections, sinus disease or nasal polyps, history of cancer, changes in medications or recent dental procedures. Patient has mentioned these symptoms to her primary care provider who has referred her to ENT. I think this is reasonable to rule out sinus disease or polyps. I discussed lab results with patient and family at bedside. I recommended that they follow up with ENT and PCP within one week for further discussion of the labs are still pending. Patient may need an MRI to rule out neoplasm. Patient and family at bedside are agreeable to discharge plan. ED return precautions given.  Patient, ED treatment and discharge plan was discussed with supervising physician who is agreeable with plan.   Final Clinical Impressions(s) / ED Diagnoses    Final diagnoses:  Taste sense altered    New Prescriptions Discharge Medication List as of 03/16/2017  4:51 PM     I personally performed the services described in this documentation, which was scribed in my presence. The recorded information has been reviewed and is accurate.     Kinnie Feil, PA-C 03/16/17 1730    Merrily Pew, MD 03/17/17 [phone]

## 2017-03-16 NOTE — Discharge Instructions (Signed)
The lab work we did today in the emergency department is overall reassuring. Your thyroid panel, zinc level and vitamin B12 results are still pending. Please follow-up with your primary care provider within one week for further discussion of your symptoms and to discuss your recent lab results. You may need an MRI as follow-up and to continue your workup. Follow-up with your ENT appointment as scheduled. Return to the emergency room if you notice worsening of your symptoms or if your symptoms become concerning in any way.

## 2017-03-19 ENCOUNTER — Other Ambulatory Visit: Payer: Self-pay | Admitting: Family Medicine

## 2017-03-19 DIAGNOSIS — I10 Essential (primary) hypertension: Secondary | ICD-10-CM

## 2017-03-19 LAB — ZINC: Zinc: 91 ug/dL (ref 56–134)

## 2017-03-25 ENCOUNTER — Telehealth: Payer: Self-pay | Admitting: Family Medicine

## 2017-03-25 NOTE — Telephone Encounter (Signed)
Can you please obtain more information about this? Thank you

## 2017-03-25 NOTE — Telephone Encounter (Signed)
Patient called the office asking to speak with PCP in regards to getting a clearance and order for an MRI. Pt stated that's its very important. Pt was recently in the hospital and cannot wait until 5/16 to be seen by PCP.  Please follow up.   Thank you

## 2017-03-28 NOTE — Telephone Encounter (Signed)
Writer spoke to patient regarding the requested MRI.  She states the the ED physician told her to get an MRI for her "taste buds". Writer spoke with MD, patient to see her 04/16/17 for f/u.  Patient told that it would be addressed at that time.

## 2017-04-14 DIAGNOSIS — R438 Other disturbances of smell and taste: Secondary | ICD-10-CM | POA: Diagnosis not present

## 2017-04-15 ENCOUNTER — Telehealth: Payer: Self-pay | Admitting: Family Medicine

## 2017-04-15 NOTE — Telephone Encounter (Signed)
Patient's daughter called the office to request medication refill for carvedilol (COREG) 25 MG tablet. Please call it in to [facility] in [place].   Thank you.

## 2017-04-16 ENCOUNTER — Inpatient Hospital Stay: Payer: Medicare Other | Admitting: Family Medicine

## 2017-04-17 ENCOUNTER — Inpatient Hospital Stay: Payer: Medicare Other

## 2017-05-01 ENCOUNTER — Ambulatory Visit: Payer: Medicare Other | Attending: Family Medicine | Admitting: Physician Assistant

## 2017-05-01 ENCOUNTER — Encounter: Payer: Self-pay | Admitting: Physician Assistant

## 2017-05-01 ENCOUNTER — Other Ambulatory Visit: Payer: Self-pay | Admitting: Physician Assistant

## 2017-05-01 VITALS — BP 185/93 | HR 70 | Temp 99.0°F | Resp 16 | Wt 157.6 lb

## 2017-05-01 DIAGNOSIS — Z880 Allergy status to penicillin: Secondary | ICD-10-CM | POA: Diagnosis not present

## 2017-05-01 DIAGNOSIS — E7800 Pure hypercholesterolemia, unspecified: Secondary | ICD-10-CM | POA: Diagnosis not present

## 2017-05-01 DIAGNOSIS — E1165 Type 2 diabetes mellitus with hyperglycemia: Secondary | ICD-10-CM | POA: Diagnosis not present

## 2017-05-01 DIAGNOSIS — I10 Essential (primary) hypertension: Secondary | ICD-10-CM

## 2017-05-01 DIAGNOSIS — R739 Hyperglycemia, unspecified: Secondary | ICD-10-CM

## 2017-05-01 DIAGNOSIS — R1013 Epigastric pain: Secondary | ICD-10-CM | POA: Diagnosis not present

## 2017-05-01 DIAGNOSIS — R109 Unspecified abdominal pain: Secondary | ICD-10-CM | POA: Diagnosis present

## 2017-05-01 LAB — GLUCOSE, POCT (MANUAL RESULT ENTRY): POC Glucose: 93 mg/dl (ref 70–99)

## 2017-05-01 LAB — POCT GLYCOSYLATED HEMOGLOBIN (HGB A1C): HEMOGLOBIN A1C: 6.1

## 2017-05-01 MED ORDER — LOSARTAN POTASSIUM 100 MG PO TABS: 100.0000 mg | ORAL_TABLET | Freq: Every day | ORAL | 1 refills | Status: DC

## 2017-05-01 MED ORDER — CARVEDILOL 25 MG PO TABS: ORAL_TABLET | ORAL | 0 refills | Status: DC

## 2017-05-01 MED ORDER — HYDRALAZINE HCL 25 MG PO TABS: 25.0000 mg | ORAL_TABLET | Freq: Two times a day (BID) | ORAL | 3 refills | Status: DC

## 2017-05-01 MED ORDER — ATORVASTATIN CALCIUM 40 MG PO TABS: 40.0000 mg | ORAL_TABLET | Freq: Every day | ORAL | 1 refills | Status: DC

## 2017-05-01 NOTE — Patient Instructions (Signed)
If blood pressure is running >145/85, restart Hydralazine 25mg  twice daily

## 2017-05-01 NOTE — Progress Notes (Signed)
Patient ID: Caroline Cooper, female   DOB: [birth year], 82 y.o.   MRN: [id number]     Caroline Cooper, is a 82 y.o. female  MVE:[id number]  GEZ:[id number]  DOB - 04/13/1935  Subjective:  Chief Complaint and HPI: Caroline Cooper is a 82 y.o. female here today for a follow up visit. She is continuing to have altered taste, but it isn't in her whole mouth now-only on the tip of her tongue.  The ENT MD did not find any abnormalities.    She has been having some abdominal pains after meals for the past 2 weeks.  The pain is mild to moderate and improves with pepcid.  No N/V.  She had one episode of diarrhea. No f/c.  The pain lasts <15 mins after meals.    She has not been taking hydralazine for her BP but has not been checking BP at home.  Denies dizziness/CP/HA.    ROS:   Constitutional:  No f/c, No night sweats, No unexplained weight loss. EENT:  No vision changes, No blurry vision, No hearing changes. No mouth, throat, or ear problems.  Respiratory: No cough, No SOB Cardiac: No CP, no palpitations GI:  + abd pain, No N/V/D.  Some bloating and cramping abd pain after meals GU: No Urinary s/sx Musculoskeletal: No joint pain Neuro: No headache, no dizziness, no motor weakness.  Skin: No rash Endocrine:  No polydipsia. No polyuria.  Psych: Denies SI/HI  No problems updated.  ALLERGIES: Allergies  Allergen Reactions  . Penicillins     Unknown reaction  Has patient had a PCN reaction causing immediate rash, facial/tongue/throat swelling, SOB or lightheadedness with hypotension: No Has patient had a PCN reaction causing severe rash involving mucus membranes or skin necrosis: No Has patient had a PCN reaction that required hospitalization No Has patient had a PCN reaction occurring within the last 10 years: No If all of the above answers are "NO", then may proceed with Cephalosporin use.     PAST MEDICAL HISTORY: Past Medical History:  Diagnosis Date  . Diabetes mellitus without  complication ([place])   . Diverticulitis   . DM (diabetes mellitus) ([place]) 09/24/2015  . HTN (hypertension) 09/24/2015  . Hyperlipidemia 09/24/2015  . Hypertension     MEDICATIONS AT HOME: Prior to Admission medications   Medication Sig Start Date End Date Taking? Authorizing Provider  carvedilol (COREG) 25 MG tablet TAKE 1 TABLET BY MOUTH TWICE DAILY WITH MEAL 05/01/17  Yes Saliha Salts M, PA-C  losartan (COZAAR) 100 MG tablet Take 1 tablet (100 mg total) by mouth daily. 05/01/17  Yes Argentina Donovan, PA-C  Multiple Vitamins-Minerals (CENTRUM ADULTS) TABS Take 1 tablet by mouth daily.   Yes [provider]  olopatadine (PATANOL) 0.1 % ophthalmic solution Place 1 drop into both eyes 2 (two) times daily. 03/14/16  Yes Arnoldo Morale, MD  oxybutynin (DITROPAN) 5 MG tablet Take 1 tablet (5 mg total) by mouth 2 (two) times daily. 12/23/16  Yes Arnoldo Morale, MD  atorvastatin (LIPITOR) 40 MG tablet Take 1 tablet (40 mg total) by mouth daily. 05/01/17   Argentina Donovan, PA-C  hydrALAZINE (APRESOLINE) 25 MG tablet Take 1 tablet (25 mg total) by mouth 2 (two) times daily. 05/01/17   Argentina Donovan, PA-C     Objective:  EXAM:   Vitals:   05/01/17 1436  BP: (!) 185/93  Pulse: 70  Resp: 16  Temp: 99 F (37.2 C)  TempSrc: Oral  SpO2: 99%  Weight: 157 lb  9.6 oz (71.5 kg)    General appearance : A&OX3. NAD. Non-toxic-appearing HEENT: Atraumatic and Normocephalic.  PERRLA. EOM intact.  TM clear B. Mouth-MMM, post pharynx WNL w/o erythema, No PND.  Tongue has normal appearance Neck: supple, no JVD. No cervical lymphadenopathy. No thyromegaly Chest/Lungs:  Breathing-non-labored, Good air entry bilaterally, breath sounds normal without rales, rhonchi, or wheezing  CVS: S1 S2 regular, no murmurs, gallops, rubs  Abdomen: Bowel sounds present, Non tender and not distended with no gaurding, rigidity or rebound. Extremities: Bilateral Lower Ext shows no edema, both legs are warm to touch  with = pulse throughout Neurology:  CN II-XII grossly intact, Non focal.   Psych:  TP linear. J/I WNL. Normal speech. Appropriate eye contact and affect.  Skin:  No Rash  Data Review Lab Results  Component Value Date   HGBA1C 6.1 05/01/2017   HGBA1C 5.9 03/14/2017   HGBA1C 6.2 09/30/2016     Assessment & Plan   1. Hyperglycemia suboptimal control but should improve with better diet. I have had a lengthy discussion and provided education about insulin resistance and the intake of too much sugar/refined carbohydrates.  I have advised the patient to work at a goal of eliminating sugary drinks, candy, desserts, sweets, refined sugars, processed foods, and white carbohydrates.  The patient expresses understanding.  - HgB A1c - Glucose (CBG)  2. Essential hypertension Uncontrolled.  Monitor BP at home. If blood pressure is running >145/85, restart Hydralazine 25mg  twice daily continue- carvedilol (COREG) 25 MG tablet; TAKE 1 TABLET BY MOUTH TWICE DAILY WITH MEAL  Dispense: 180 tablet; Refill: 0 continue- losartan (COZAAR) 100 MG tablet; Take 1 tablet (100 mg total) by mouth daily.  Dispense: 90 tablet; Refill: 1 - hydrALAZINE (APRESOLINE) 25 MG tablet; Take 1 tablet (25 mg total) by mouth 2 (two) times daily.  Dispense: 60 tablet; Refill: 3  3. Pure hypercholesterolemia - atorvastatin (LIPITOR) 40 MG tablet; Take 1 tablet (40 mg total) by mouth daily.  Dispense: 90 tablet; Refill: 1  4. Dyspepsia - H. pylori breath test - Comprehensive metabolic panel Continue prevacid or pepcid prn   Patient have been counseled extensively about nutrition and exercise  Return in about 3 months (around 08/01/2017) for Dr Jarold Song DM and htn, fasting lipids.  The patient was given clear instructions to go to ER or return to medical center if symptoms don't improve, worsen or new problems develop. The patient verbalized understanding. The patient was told to call to get lab results if they haven't heard  anything in the next week.     Freeman Caldron, PA-C [facility] and [place], [place]   05/02/2017, 7:37 AM

## 2017-05-02 LAB — COMPREHENSIVE METABOLIC PANEL
A/G RATIO: 1.8 (ref 1.2–2.2)
ALBUMIN: 4.6 g/dL (ref 3.5–4.8)
ALT: 14 IU/L (ref 0–32)
AST: 19 IU/L (ref 0–40)
Alkaline Phosphatase: 85 IU/L (ref 39–117)
BILIRUBIN TOTAL: 0.5 mg/dL (ref 0.0–1.2)
BUN / CREAT RATIO: 20 (ref 12–28)
BUN: 19 mg/dL (ref 8–27)
CHLORIDE: 99 mmol/L (ref 96–106)
CO2: 25 mmol/L (ref 18–29)
Calcium: 9.4 mg/dL (ref 8.7–10.3)
Creatinine, Ser: 0.93 mg/dL (ref 0.57–1.00)
GFR, EST AFRICAN AMERICAN: 68 mL/min/{1.73_m2} (ref >59)
GFR, EST NON AFRICAN AMERICAN: 59 mL/min/{1.73_m2} — AB (ref >59)
GLUCOSE: 93 mg/dL (ref 65–99)
Globulin, Total: 2.6 g/dL (ref 1.5–4.5)
Potassium: 4.3 mmol/L (ref 3.5–5.2)
Sodium: 141 mmol/L (ref 134–144)
TOTAL PROTEIN: 7.2 g/dL (ref 6.0–8.5)

## 2017-05-05 LAB — H. PYLORI BREATH TEST: H pylori Breath Test: POSITIVE — AB

## 2017-05-06 ENCOUNTER — Telehealth: Payer: Self-pay | Admitting: Family Medicine

## 2017-05-06 NOTE — Telephone Encounter (Signed)
Pt. Daughter called requesting to speak with pt. PCP regarding pt. Light headiness. Pt. Daughter states she needs some advise from pt. PCP. Please f/u

## 2017-05-06 NOTE — Telephone Encounter (Signed)
Spoke wit daughter, daughter stated that every time she talks with mom on [place] she complains about "head doesn't feel right" eyes blur been of & on for a month, CMA recommended to make an appt to be evaluated  CMA transfer to scheduler  so the daughter could make an appt

## 2017-05-06 NOTE — Telephone Encounter (Signed)
Please ask the patient when did the lightheadedness begin? Sitting or standing? Did the patient take medication before there lightheadedness began. Is she still having the symptom?

## 2017-05-06 NOTE — Telephone Encounter (Signed)
CMA call to f/up with daughter being concern about her mom being lightheaded

## 2017-05-08 ENCOUNTER — Other Ambulatory Visit: Payer: Self-pay | Admitting: Physician Assistant

## 2017-05-08 DIAGNOSIS — A048 Other specified bacterial intestinal infections: Secondary | ICD-10-CM

## 2017-05-08 MED ORDER — METRONIDAZOLE 500 MG PO TABS: 500.0000 mg | ORAL_TABLET | Freq: Two times a day (BID) | ORAL | 0 refills | Status: DC

## 2017-05-08 MED ORDER — OMEPRAZOLE 40 MG PO CPDR: 40.0000 mg | DELAYED_RELEASE_CAPSULE | Freq: Every day | ORAL | 0 refills | Status: DC

## 2017-05-08 MED ORDER — CLARITHROMYCIN 500 MG PO TABS: 500.0000 mg | ORAL_TABLET | Freq: Two times a day (BID) | ORAL | 0 refills | Status: DC

## 2017-05-08 NOTE — Telephone Encounter (Signed)
-----   Message from Trecia Rogers, [place] sent at 05/08/2017  9:23 AM EDT ----- Please inform patient of being positive for Bacteria that may cause stomach ulcers. Patient should take antibiotics for 10 days and DO NOT TAKE cholesterol medication until after antibiotics are complete. All other labs are normal.

## 2017-05-08 NOTE — Telephone Encounter (Signed)
Pt. Daughter returned nurse call. Please f/u

## 2017-05-08 NOTE — Telephone Encounter (Signed)
CMA call regarding lab results   Patient did not answer but left a detailed message  & if have any questions just to call back   

## 2017-05-08 NOTE — Progress Notes (Signed)
Please inform patient of being positive for Bacteria that may cause stomach ulcers. Patient should take antibiotics for 10 days and DO NOT TAKE cholesterol medication until after antibiotics are complete. All other labs are normal.

## 2017-05-08 NOTE — Telephone Encounter (Signed)
CMA return patient daughter call  Patient daughter was aware and understood

## 2017-05-09 ENCOUNTER — Ambulatory Visit: Payer: Medicare Other | Attending: Family Medicine | Admitting: Family Medicine

## 2017-05-09 ENCOUNTER — Other Ambulatory Visit: Payer: Self-pay | Admitting: Family Medicine

## 2017-05-09 ENCOUNTER — Encounter: Payer: Self-pay | Admitting: Family Medicine

## 2017-05-09 ENCOUNTER — Ambulatory Visit: Payer: Medicare Other | Admitting: Family Medicine

## 2017-05-09 VITALS — BP 164/76 | HR 66 | Temp 98.6°F | Resp 16 | Ht 62.0 in | Wt 155.0 lb

## 2017-05-09 DIAGNOSIS — R42 Dizziness and giddiness: Secondary | ICD-10-CM

## 2017-05-09 DIAGNOSIS — F3289 Other specified depressive episodes: Secondary | ICD-10-CM

## 2017-05-09 DIAGNOSIS — I10 Essential (primary) hypertension: Secondary | ICD-10-CM | POA: Diagnosis not present

## 2017-05-09 DIAGNOSIS — E785 Hyperlipidemia, unspecified: Secondary | ICD-10-CM | POA: Insufficient documentation

## 2017-05-09 DIAGNOSIS — E119 Type 2 diabetes mellitus without complications: Secondary | ICD-10-CM

## 2017-05-09 DIAGNOSIS — J3089 Other allergic rhinitis: Secondary | ICD-10-CM

## 2017-05-09 DIAGNOSIS — J302 Other seasonal allergic rhinitis: Secondary | ICD-10-CM | POA: Diagnosis not present

## 2017-05-09 DIAGNOSIS — Z9114 Patient's other noncompliance with medication regimen: Secondary | ICD-10-CM | POA: Diagnosis not present

## 2017-05-09 DIAGNOSIS — F329 Major depressive disorder, single episode, unspecified: Secondary | ICD-10-CM | POA: Insufficient documentation

## 2017-05-09 DIAGNOSIS — F32A Depression, unspecified: Secondary | ICD-10-CM | POA: Insufficient documentation

## 2017-05-09 MED ORDER — OLOPATADINE HCL 0.1 % OP SOLN: 1.0000 [drp] | Freq: Two times a day (BID) | OPHTHALMIC | 2 refills | Status: DC

## 2017-05-09 MED ORDER — CITALOPRAM HYDROBROMIDE 20 MG PO TABS: 20.0000 mg | ORAL_TABLET | Freq: Every day | ORAL | 3 refills | Status: DC

## 2017-05-09 NOTE — Progress Notes (Signed)
Light headed  Pt stated has a salty taste on her mouth  No tobacco user  No ETOH user  No suicidal thoughts in the past two weeks

## 2017-05-09 NOTE — Progress Notes (Signed)
Subjective:  Patient ID: Caroline Cooper, female    DOB: 10-Jan-1935  Age: 82 y.o. MRN: [id number]  CC: Follow-up visit  HPI Caroline Cooper  is a 82 year old female with a history of hypertension, type 2 diabetes mellitus (A1c 6.1), overactive bladder who comes in today for follow-up of visit.  She is on dietary control for management of her diabetes mellitus. She recently tested positive for Helicobacter pylori and is on  The tripleregen.  She complains of lightheadedness and so has been taking half of 25 mg carvedilol pill. But she endorses taking hydralazine and losartan and her blood pressure is elevated today.  She is accompanied by her son and complains of salty taste in her mouth, lightheadedness for which she had been seen at the ED and was also referred to ENT with no new recommendations. Her son describes her symptoms as 'feeling of fogginess over her head and feeling like she is in a cloud' with associated reduced energy, lethargy. Denies headaches. She lives with her husband on but does not like to go out, she is not motivated but her husband goes out to walk the dog while she does not. She breaks down crying any problems me she is does not feel well.  Past Medical History:  Diagnosis Date  . Diabetes mellitus without complication ([place])   . Diverticulitis   . DM (diabetes mellitus) ([place]) 09/24/2015  . HTN (hypertension) 09/24/2015  . Hyperlipidemia 09/24/2015  . Hypertension     Past Surgical History:  Procedure Laterality Date  . ABDOMINAL HYSTERECTOMY    . CHOLECYSTECTOMY    . COLON SURGERY     COLECTOMY FOR DIVERTICULITIS  . HERNIA REPAIR      Allergies  Allergen Reactions  . Penicillins     Unknown reaction  Has patient had a PCN reaction causing immediate rash, facial/tongue/throat swelling, SOB or lightheadedness with hypotension: No Has patient had a PCN reaction causing severe rash involving mucus membranes or skin necrosis: No Has patient had a PCN reaction  that required hospitalization No Has patient had a PCN reaction occurring within the last 10 years: No If all of the above answers are "NO", then may proceed with Cephalosporin use.      Outpatient Medications Prior to Visit  Medication Sig Dispense Refill  . clarithromycin (BIAXIN) 500 MG tablet Take 1 tablet (500 mg total) by mouth 2 (two) times daily. 20 tablet 0  . hydrALAZINE (APRESOLINE) 25 MG tablet Take 1 tablet (25 mg total) by mouth 2 (two) times daily. 60 tablet 3  . losartan (COZAAR) 100 MG tablet Take 1 tablet (100 mg total) by mouth daily. 90 tablet 1  . metroNIDAZOLE (FLAGYL) 500 MG tablet Take 1 tablet (500 mg total) by mouth 2 (two) times daily. 20 tablet 0  . Multiple Vitamins-Minerals (CENTRUM ADULTS) TABS Take 1 tablet by mouth daily.    Marland Kitchen omeprazole (PRILOSEC) 40 MG capsule Take 1 capsule (40 mg total) by mouth daily. 30 capsule 0  . atorvastatin (LIPITOR) 40 MG tablet Take 1 tablet (40 mg total) by mouth daily. (Patient not taking: Reported on 05/09/2017) 90 tablet 1  . carvedilol (COREG) 25 MG tablet TAKE 1 TABLET BY MOUTH TWICE DAILY WITH MEAL (Patient not taking: Reported on 05/09/2017) 180 tablet 0  . oxybutynin (DITROPAN) 5 MG tablet Take 1 tablet (5 mg total) by mouth 2 (two) times daily. (Patient not taking: Reported on 05/09/2017) 60 tablet 3  . olopatadine (PATANOL) 0.1 % ophthalmic solution Place 1  drop into both eyes 2 (two) times daily. (Patient not taking: Reported on 05/09/2017) 5 mL 2   No facility-administered medications prior to visit.     ROS Review of Systems  Constitutional: Positive for fatigue. Negative for activity change and appetite change.  HENT: Negative for congestion, sinus pressure and sore throat.   Eyes: Positive for discharge. Negative for visual disturbance.  Respiratory: Negative for cough, chest tightness, shortness of breath and wheezing.   Cardiovascular: Negative for chest pain and palpitations.  Gastrointestinal: Negative for  abdominal distention, abdominal pain and constipation.  Endocrine: Negative for polydipsia.  Genitourinary: Negative for dysuria and frequency.  Musculoskeletal: Negative for arthralgias and back pain.  Skin: Negative for rash.  Neurological: Positive for light-headedness. Negative for tremors and numbness.  Hematological: Does not bruise/bleed easily.  Psychiatric/Behavioral: Positive for dysphoric mood. Negative for agitation and behavioral problems.    Objective:  BP (!) 164/76 (BP Location: Right Arm, Patient Position: Sitting, Cuff Size: Large)   Pulse 66   Temp 98.6 F (37 C) (Oral)   Resp 16   Ht 5\' 2"  (1.575 m)   Wt 155 lb (70.3 kg)   SpO2 98%   BMI 28.35 kg/m   BP/Weight 05/09/2017 05/01/2017 9/35/7017  Systolic BP 793 903 009  Diastolic BP 76 93 80  Wt. (Lbs) 155 157.6 157  BMI 28.35 28.83 28.72      Physical Exam  Constitutional: She is oriented to person, place, and time. She appears well-developed and well-nourished.  Cardiovascular: Normal rate, normal heart sounds and intact distal pulses.   No murmur heard. Pulmonary/Chest: Effort normal and breath sounds normal. She has no wheezes. She has no rales. She exhibits no tenderness.  Abdominal: Soft. Bowel sounds are normal. She exhibits no distension and no mass. There is no tenderness.  Musculoskeletal: Normal range of motion.  Neurological: She is alert and oriented to person, place, and time.  Skin: Skin is warm and dry.  Psychiatric: She exhibits a depressed mood.    Lab Results  Component Value Date   HGBA1C 6.1 05/01/2017    Assessment & Plan:   1. Seasonal allergic rhinitis due to other allergic trigger - olopatadine (PATANOL) 0.1 % ophthalmic solution; Place 1 drop into both eyes 2 (two) times daily.  Dispense: 5 mL; Refill: 2  2. Essential hypertension Uncontrolled due to noncompliance with medications Advised to take carvedilol 25 mg along with hydralazine and losartan. If she develops  lightheadedness and she is to hold off on hydralazine and take the other 2 - we will attempt to work an elimination We'll consider switching back to lower dose of carvedilol if lightheadedness persist Low-sodium diet  3. Lightheadedness Given ongoing symptoms of salty taste in mouth, foggy feeling in head, lightheadedness several need to exclude acute intracranial - CT Head Wo Contrast; Future  4. Other depression This could explain lack of motivation, decreased energy We'll commence an SSRI Cannot exclude process of aging playing a role in symptoms - citalopram (CELEXA) 20 MG tablet; Take 1 tablet (20 mg total) by mouth daily.  Dispense: 30 tablet; Refill: 3  5. Diabetes mellitus without complication ([place]) Diet controlled with A1c of 6.1   Meds ordered this encounter  Medications  . olopatadine (PATANOL) 0.1 % ophthalmic solution    Sig: Place 1 drop into both eyes 2 (two) times daily.    Dispense:  5 mL    Refill:  2  . citalopram (CELEXA) 20 MG tablet    Sig:  Take 1 tablet (20 mg total) by mouth daily.    Dispense:  30 tablet    Refill:  3    Follow-up: Return in about 3 weeks (around 05/30/2017) for Follow-up of hypertension.   Arnoldo Morale MD

## 2017-05-09 NOTE — Patient Instructions (Signed)
Trastorno Metallurgist (Major Depressive Disorder, Adult) El trastorno depresivo mayor es una enfermedad mental. Tambin se la conoce como depresin clnica o depresin unipolar. Este trastorno suele causar sentimientos de tristeza, desesperanza o impotencia. Tambin puede provocar sntomas fsicos. Puede afectar el desempeo laboral o acadmico, las relaciones personales y [place] cotidianas. El trastorno puede ser [place], moderado o grave. Puede ocurrir una sola vez (trastorno depresivo mayor aislado) o varias veces (trastorno depresivo mayor recurrente). CAUSAS Se desconoce la causa exacta de esta afeccin. Suele ser causada por una combinacin de factores, que pueden incluir los siguientes:  Factores genticos. Son rasgos que se transmiten de los padres a los hijos.  Factores individuales. Su personalidad, comportamiento y Water quality scientist en que domina sus pensamientos y sentimientos pueden contribuir al trastorno Tour manager. Esto incluye rasgos de personalidad y comportamientos adquiridos de Producer, television/film/video.  Factores fsicos, tales como: ? Dispensing optician regin del cerebro que controla las emociones. Esta regin del cerebro puede ser distinta de la de las personas que no padecen este trastorno. ? Enfermedades mdicas o psiquitricas a Barrister's clerk (crnicas).  [employer]. Las experiencias traumticas o los cambios importantes en la vida de una persona pueden contribuir al trastorno depresivo mayor. FACTORES DE RIESGO Es ms probable que esta afeccin se manifieste en las mujeres. Los siguientes factores pueden hacer que usted sea propenso a Insurance risk surveyor un trastorno depresivo mayor:  Un historial familiar de depresin.  Relaciones familiares conflictivas.  Niveles anormalmente bajos de ciertas sustancias qumicas en el cerebro.  Eventos traumticos en la infancia, especialmente el maltrato o la prdida de un padre.  Vivir muchas situaciones de estrs o  sufrir estrs crnico, en especial debido a experiencias de vida desagradables o prdidas.  Tener antecedentes de lo siguiente: ? Enfermedad fsica crnica. ? Otros trastornos [employer]. ? Consumo de drogas.  Condiciones de vida deficientes.  Sufrir exclusin o discriminacin social a diario. SNTOMAS Los principales sntomas del trastorno depresivo mayor, por lo general, incluyen los siguientes:  Depresin o irritabilidad constantes.  Prdida de inters en cosas y actividades. Otros sntomas pueden ser los siguientes:  Dormir o comer Desha en el peso.  Estar fatigado o falto de Teacher, early years/pre.  Sentimientos de inutilidad o culpa.  Dificultad para pensar con claridad o tomar decisiones.  Pensamientos suicidas o de Market researcher a Producer, television/film/video.  Agitacin o debilidad fsicas.  Aislarse. Los [employer] graves del trastorno depresivo mayor pueden implicar otros sntomas, como por ejemplo:  Ideas delirantes o alucinaciones, que hacen imaginar cosas que no son reales (depresin psictica).  Depresin leve que dura como mnimo un ao (depresin crnica o trastorno depresivo persistente).  Sentimientos intensos de tristeza y desesperanza (Sales executive).  Dificultad para hablar y moverse (depresin catatnica). DIAGNSTICO Esta afeccin se puede diagnosticar en funcin de lo siguiente:  Sus sntomas.  Sus antecedentes mdicos, incluido su historial de salud mental. Esto podra incluir exmenes para evaluar su salud mental. Es posible que deba responder preguntas sobre su estilo de vida, incluso si consume drogas o alcohol, y por cunto tiempo ha tenido sntomas del trastorno Tour manager.  Un examen fsico.  Anlisis de sangre para descartar otras afecciones. Para que el mdico confirme un diagnstico de trastorno depresivo mayor, usted debe tener un estado de nimo depresivo y al menos cuatro sntomas [place], [place], en un plazo de [employer]. TRATAMIENTO Los profesionales que por lo general tratan [place] [employer]  trastorno pertenecen al rea de la salud mental, como psiclogos, psiquiatras y trabajadores sociales clnicos. Puede ser que necesite ms de un tipo de [place]. El tratamiento puede incluir lo siguiente:  Psicoterapia. Esto tambin se puede denominar apoyo psicolgico o asistencia psicolgica. Los tipos de psicoterapia incluyen lo siguiente: ? Terapia cognitivo conductual (TCC). Este tipo de terapia le ensea a Solicitor, pensamientos y comportamientos poco saludables, y a Engineer, manufacturing systems con pensamientos y acciones positivas. ? Terapia interpersonal (TI). Lo ayuda a mejorar la [employer] se relaciona y Mining engineer con los dems. ? Careers information officer. Este tratamiento incluye a los miembros de la familia.  Medicamentos para controlar la ansiedad y la depresin, o para ayudarlo a Chartered certified accountant emociones y comportamientos.  Cambios de estilo de vida tales como: ? Limitar el consumo de alcohol y drogas. ? Hacer actividad fsica con regularidad. ? Dormir lo suficiente. ? Optar por alimentos saludables. ? Pasar ms tiempo al [employer]. Se pueden realizar tratamientos que implican la estimulacin cerebral en caso de que haya sntomas muy graves, o cuando los medicamentos u otros tratamientos no funcionen. Estos tratamientos incluyen terapia electroconvulsiva, estimulacin magntica transcraneal y estimulacin del nervio vago. INSTRUCCIONES PARA EL CUIDADO EN EL HOGAR Actividad  Retome sus actividades normales como se lo haya indicado el mdico.  Haga actividad fsica con frecuencia y pase tiempo al aire libre como se lo haya indicado el mdico. Instrucciones generales  [employer] medicamentos de venta libre y los recetados solamente como se lo haya indicado el mdico.  No beba alcohol. Si toma alcohol, limite el consumo a no ms de 24medida por da si es mujer y no est  [place], y 8medidas por da si es hombre. Una medida equivale a 12onzas de cerveza, 5onzas de vino o 1onzas de bebidas alcohlicas de alta graduacin. El alcohol puede inhibir el efecto de los antidepresivos. Hable con el mdico sobre el consumo de alcohol.  Siga una dieta saludable y duerma lo suficiente.  Encuentre actividades que disfrute y hgase el tiempo para Production manager.  Considere la posibilidad de Chief Financial Officer en un grupo de apoyo. El mdico podra recomendarle un grupo de apoyo.  Concurra a todas las visitas de control como se lo haya indicado el mdico. Esto es importante. SOLICITE ATENCIN MDICA SI:  Los sntomas empeoran.  Presenta nuevos sntomas.  SOLICITE ATENCIN MDICA DE INMEDIATO SI:  Se autoagrede.  Tiene pensamientos serios acerca de lastimarse a usted mismo o daar a Producer, television/film/video.  Ve, oye, sab[place], huele o siente cosas que no estn presentes (alucinaciones).  PARA OBTENER MS [place] [employer] on Mental Illness)  www.nami.[place] Mental de IT trainer. ([place])  https://carter.com/ [place] para la Prevencin del Suicidio (National Suicide Prevention Lifeline)  1-800-273-TALK 878-064-2879). La asistencia es gratuita y est disponible las 24horas. Esta informacin no tiene Marine scientist el consejo del mdico. Asegrese de hacerle al mdico cualquier pregunta que tenga. Document Released: 03/15/2013 Document Revised: 12/09/2014 Document Reviewed: 05/29/2016 Elsevier Interactive Patient Education  2017 [employer].

## 2017-05-11 ENCOUNTER — Emergency Department (HOSPITAL_COMMUNITY): Payer: Medicare Other

## 2017-05-11 ENCOUNTER — Encounter (HOSPITAL_COMMUNITY): Payer: Self-pay

## 2017-05-11 ENCOUNTER — Emergency Department (HOSPITAL_COMMUNITY)
Admission: EM | Admit: 2017-05-11 | Discharge: 2017-05-11 | Disposition: A | Discharge status: Home / Self Care | Payer: Medicare Other | Attending: Emergency Medicine | Admitting: Emergency Medicine

## 2017-05-11 DIAGNOSIS — R1010 Upper abdominal pain, unspecified: Secondary | ICD-10-CM | POA: Diagnosis not present

## 2017-05-11 DIAGNOSIS — Z79899 Other long term (current) drug therapy: Secondary | ICD-10-CM | POA: Diagnosis not present

## 2017-05-11 DIAGNOSIS — E119 Type 2 diabetes mellitus without complications: Secondary | ICD-10-CM | POA: Insufficient documentation

## 2017-05-11 DIAGNOSIS — R51 Headache: Secondary | ICD-10-CM | POA: Diagnosis not present

## 2017-05-11 DIAGNOSIS — I10 Essential (primary) hypertension: Secondary | ICD-10-CM | POA: Insufficient documentation

## 2017-05-11 DIAGNOSIS — K439 Ventral hernia without obstruction or gangrene: Secondary | ICD-10-CM | POA: Diagnosis not present

## 2017-05-11 LAB — URINALYSIS, ROUTINE W REFLEX MICROSCOPIC
BACTERIA UA: NONE SEEN
BILIRUBIN URINE: NEGATIVE
Glucose, UA: NEGATIVE mg/dL
Ketones, ur: NEGATIVE mg/dL
Nitrite: NEGATIVE
Protein, ur: NEGATIVE mg/dL
SPECIFIC GRAVITY, URINE: 1.016 (ref 1.005–1.030)
pH: 5 (ref 5.0–8.0)

## 2017-05-11 LAB — COMPREHENSIVE METABOLIC PANEL
ALBUMIN: 4.3 g/dL (ref 3.5–5.0)
ALT: 20 U/L (ref 14–54)
AST: 35 U/L (ref 15–41)
Alkaline Phosphatase: 68 U/L (ref 38–126)
Anion gap: 8 (ref 5–15)
BUN: 14 mg/dL (ref 6–20)
CHLORIDE: 105 mmol/L (ref 101–111)
CO2: 28 mmol/L (ref 22–32)
CREATININE: 0.85 mg/dL (ref 0.44–1.00)
Calcium: 9.2 mg/dL (ref 8.9–10.3)
GFR calc Af Amer: >60 mL/min (ref >60)
GFR calc non Af Amer: >60 mL/min (ref >60)
GLUCOSE: 130 mg/dL — AB (ref 65–99)
POTASSIUM: 4.4 mmol/L (ref 3.5–5.1)
Sodium: 141 mmol/L (ref 135–145)
Total Bilirubin: 0.9 mg/dL (ref 0.3–1.2)
Total Protein: 7.5 g/dL (ref 6.5–8.1)

## 2017-05-11 LAB — CBC
HEMATOCRIT: 39.5 % (ref 36.0–46.0)
Hemoglobin: 13.5 g/dL (ref 12.0–15.0)
MCH: 31.5 pg (ref 26.0–34.0)
MCHC: 34.2 g/dL (ref 30.0–36.0)
MCV: 92.1 fL (ref 78.0–100.0)
PLATELETS: 187 10*3/uL (ref 150–400)
RBC: 4.29 MIL/uL (ref 3.87–5.11)
RDW: 13.2 % (ref 11.5–15.5)
WBC: 8.6 10*3/uL (ref 4.0–10.5)

## 2017-05-11 LAB — LIPASE, BLOOD: LIPASE: 24 U/L (ref 11–51)

## 2017-05-11 MED ORDER — OMEPRAZOLE 20 MG PO CPDR: 20.0000 mg | DELAYED_RELEASE_CAPSULE | Freq: Two times a day (BID) | ORAL | 0 refills | Status: DC

## 2017-05-11 MED ORDER — IOPAMIDOL (ISOVUE-300) INJECTION 61%
INTRAVENOUS | Status: AC
  Administered 2017-05-11: 100 mL
  Filled 2017-05-11: qty 100

## 2017-05-11 MED ORDER — PANTOPRAZOLE SODIUM 40 MG IV SOLR
40.0000 mg | Freq: Once | INTRAVENOUS | Status: AC
  Administered 2017-05-11: 40 mg via INTRAVENOUS
  Filled 2017-05-11: qty 40

## 2017-05-11 MED ORDER — OMEPRAZOLE 40 MG PO CPDR: 40.0000 mg | DELAYED_RELEASE_CAPSULE | Freq: Two times a day (BID) | ORAL | 0 refills | Status: DC

## 2017-05-11 NOTE — ED Triage Notes (Signed)
Pt seen recently for abdominal pain.  Dx with "bacterial infection" and given antibiotics.  Pt started antibiotics x 2 days.  No N/V/D.  No fever.  Some constipation.

## 2017-05-11 NOTE — ED Provider Notes (Signed)
[facility] DEPT Provider Note   CSN: [id number] Arrival date & time: 05/11/17  1025     History   Chief Complaint Chief Complaint  Patient presents with  . Abdominal Pain    HPI Caroline Cooper is a 82 y.o. female.  HPI Patient presents the emergency department with approximate 7-10 days of intermittent upper abdominal pain that improves with eating.  She saw her doctor was started on metronidazole and clarithromycin as well as Prilosec 40 mg daily probably for suspected duodenal ulcer.  She denies nausea vomiting.  No diarrhea.  No weight loss or change in appetite.  She reports the pain is only intermittent and located in her upper abdomen.  She's had a cholecystectomy.  No significant discomfort at this time.  Patient reports since starting the antibiotics initiated by her doctor she's had ongoing upper GI discomfort that she feels like it is worsened from the medication   Past Medical History:  Diagnosis Date  . Diabetes mellitus without complication ([place])   . Diverticulitis   . DM (diabetes mellitus) ([place]) 09/24/2015  . HTN (hypertension) 09/24/2015  . Hyperlipidemia 09/24/2015  . Hypertension     Patient Active Problem List   Diagnosis Date Noted  . Depression 05/09/2017  . Overactive bladder 11/19/2016  . 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 Seasonal allergies 03/14/2016  . Need for vaccination for H flu type B with pedvaxHIB 10/03/2015  . Neck pain   . Type 2 diabetes, HbA1c goal < 7% (HCC)   . Elevated troponin 09/24/2015  . Neck pain on right side 09/24/2015  . Pain of right upper extremity 09/24/2015  . HTN (hypertension) 09/24/2015  . Diabetes mellitus without complication ([place]) 64/33/2951  . Hyperlipidemia 09/24/2015  . Leukocytosis 09/24/2015  . Colitis:/ DIAGNOSED 09/18/2015 09/24/2015    Past Surgical History:  Procedure Laterality Date  . ABDOMINAL HYSTERECTOMY    . CHOLECYSTECTOMY    . COLON SURGERY     COLECTOMY FOR DIVERTICULITIS  . HERNIA REPAIR       OB History    No data available       Home Medications    Prior to Admission medications   Medication Sig Start Date End Date Taking? Authorizing Provider  atorvastatin (LIPITOR) 40 MG tablet Take 1 tablet (40 mg total) by mouth daily. Patient not taking: Reported on 05/09/2017 05/01/17   Argentina Donovan, PA-C  carvedilol (COREG) 25 MG tablet TAKE 1 TABLET BY MOUTH TWICE DAILY WITH MEAL Patient not taking: Reported on 05/09/2017 05/01/17   Argentina Donovan, PA-C  citalopram (CELEXA) 20 MG tablet Take 1 tablet (20 mg total) by mouth daily. 05/09/17   Arnoldo Morale, MD  hydrALAZINE (APRESOLINE) 25 MG tablet Take 1 tablet (25 mg total) by mouth 2 (two) times daily. 05/01/17   Argentina Donovan, PA-C  losartan (COZAAR) 100 MG tablet Take 1 tablet (100 mg total) by mouth daily. 05/01/17   Argentina Donovan, PA-C  Multiple Vitamins-Minerals (CENTRUM ADULTS) TABS Take 1 tablet by mouth daily.    [provider]  olopatadine (PATANOL) 0.1 % ophthalmic solution Place 1 drop into both eyes 2 (two) times daily. 05/09/17   Arnoldo Morale, MD  omeprazole (PRILOSEC) 20 MG capsule Take 1 capsule (20 mg total) by mouth 2 (two) times daily before a meal. 05/11/17   Jola Schmidt, MD  oxybutynin (DITROPAN) 5 MG tablet Take 1 tablet (5 mg total) by mouth 2 (two) times daily. Patient not taking: Reported on 05/09/2017 12/23/16  Arnoldo Morale, MD    Family History History reviewed. No pertinent family history.  Social History Social History  Substance Use Topics  . Smoking status: Never Smoker  . Smokeless tobacco: Never Used  . Alcohol use No     Allergies   Penicillins   Review of Systems Review of Systems  All other systems reviewed and are negative.    Physical Exam Updated Vital Signs BP (!) 179/72 (BP Location: Left Arm)   Pulse 71   Temp 98.7 F (37.1 C) (Oral)   Resp 20   SpO2 100%   Physical Exam  Constitutional: She is oriented to person, place, and time. She appears  well-developed and well-nourished. No distress.  HENT:  Head: Normocephalic and atraumatic.  Eyes: EOM are normal.  Neck: Normal range of motion.  Cardiovascular: Normal rate, regular rhythm and normal heart sounds.   Pulmonary/Chest: Effort normal and breath sounds normal.  Abdominal: Soft. She exhibits no distension. There is no tenderness.  Musculoskeletal: Normal range of motion.  Neurological: She is alert and oriented to person, place, and time.  Skin: Skin is warm and dry.  Psychiatric: She has a normal mood and affect. Judgment normal.  Nursing note and vitals reviewed.    ED Treatments / Results  Labs (all labs ordered are listed, but only abnormal results are displayed) Labs Reviewed  COMPREHENSIVE METABOLIC PANEL - Abnormal; Notable for the following:       Result Value   Glucose, Bld 130 (*)    All other components within normal limits  URINALYSIS, ROUTINE W REFLEX MICROSCOPIC - Abnormal; Notable for the following:    Hgb urine dipstick SMALL (*)    Leukocytes, UA SMALL (*)    Squamous Epithelial / LPF 0-5 (*)    All other components within normal limits  LIPASE, BLOOD  CBC    EKG  EKG Interpretation None       Radiology Ct Head Wo Contrast  Result Date: 05/11/2017 CLINICAL DATA:  Headache and dizziness for several months EXAM: CT HEAD WITHOUT CONTRAST TECHNIQUE: Contiguous axial images were obtained from the base of the skull through the vertex without intravenous contrast. COMPARISON:  None. FINDINGS: Brain: There is age related volume loss. There is no intracranial mass, hemorrhage, extra-axial fluid collection, or midline shift. There is patchy small vessel disease in the centra semiovale bilaterally. Elsewhere gray-white compartments are unremarkable. No acute infarct evident. Vascular: There is no hyperdense vessel. There is calcification in each carotid siphon region. Skull: There is frontal hyperostosis bilaterally. Bony calvarium is intact.  Sinuses/Orbits: There is mucosal thickening and patchy opacification in multiple ethmoid air cells. There is minimal mucosal thickening in the posterior aspect the left maxillary antrum. Other visualized paranasal sinuses are clear. Orbits appear symmetric bilaterally. Other: Mastoid air cells are clear. IMPRESSION: Age related volume loss with patchy supratentorial small vessel disease. No intracranial mass, hemorrhage, or extra-axial fluid collection. No acute infarct evident. There are areas of arterial vascular calcification. There is ethmoid sinus disease bilaterally. Electronically Signed   By: Lowella Grip III M.D.   On: 05/11/2017 13:10   Ct Abdomen Pelvis W Contrast  Result Date: 05/11/2017 CLINICAL DATA:  Abdominal pain for 1 week EXAM: CT ABDOMEN AND PELVIS WITH CONTRAST TECHNIQUE: Multidetector CT imaging of the abdomen and pelvis was performed using the standard protocol following bolus administration of intravenous contrast. CONTRAST:  176mL ISOVUE-300 IOPAMIDOL (ISOVUE-300) INJECTION 61% COMPARISON:  September 18, 2015 FINDINGS: Lower chest: Lung bases are clear. Hepatobiliary:  There is a degree of hepatic steatosis. No focal liver lesions are appreciable. Gallbladder is absent. There is no intrahepatic biliary duct dilatation. Common bile duct measures just over 10 mm in diameter or without evident mass or calculus by CT. Pancreas: There is no pancreatic mass or inflammatory focus. Spleen: No splenic lesions are evident. Adrenals/Urinary Tract: Adrenals appear normal bilaterally. Kidneys bilaterally show no evident mass or hydronephrosis on either side. There is no renal or ureteral calculus on either side. Urinary bladder is midline with wall thickness within normal limits. Stomach/Bowel: There is no bowel wall or mesenteric thickening. There is a small ventral hernia. A loop of small bowel extends into this hernia without bowel wall thickening or transition zone in this area to suggest  obstruction. There is no evident bowel obstruction on this study. No free air or portal venous air. No bowel pneumatosis evident. Vascular/Lymphatic: There is atherosclerotic calcification in the aorta and common iliac arteries. There is also calcification in each hypogastric artery. There is calcification at the origins of the major mesenteric vessels without major vessel occlusion or thrombosis. Note that there is moderately severe narrowing at the origin of each renal artery. No aneurysm evident. There is no appreciable adenopathy in the abdomen or pelvis. Reproductive: Uterus is absent.  No evident pelvic mass. Other: Appendix appears unremarkable. No abscess or ascites evident in the abdomen or pelvis. Small ventral hernia contains a loop of small bowel without apparent bowel compromise. Musculoskeletal: Bones are somewhat osteoporotic. There is degenerative change in the lumbar spine. There are no blastic or lytic bone lesions. No intramuscular or abdominal wall lesion evident. IMPRESSION: There is a small ventral hernia which contains a loop of small bowel. There is loop of small bowel does not appear compromised on the current examination. Given the small nature of this hernia, intermittent symptoms of incarceration in the mid abdominal region may well be occurring. Elsewhere, there is no bowel wall thickening or bowel obstruction. No free air. No abscess. Appendix appears normal. No renal or ureteral calculus.  No hydronephrosis. Aortoiliac atherosclerosis with calcification causing narrowing of the major mesenteric vessels. No major vessel occlusion is seen. Note that there is apparent hemodynamically significant obstruction at the origin of each renal artery. In this regard, question whether patient is hypertensive. Hepatic steatosis without focal liver lesion. Gallbladder absent with slight prominence of the common bile duct. No biliary duct mass or calculus evident. Uterus also absent. Electronically  Signed   By: Lowella Grip III M.D.   On: 05/11/2017 13:20    Procedures Procedures (including critical care time)  Medications Ordered in ED Medications  pantoprazole (PROTONIX) injection 40 mg (40 mg Intravenous Given 05/11/17 1305)  iopamidol (ISOVUE-300) 61 % injection (100 mLs  Contrast Given 05/11/17 1238)     Initial Impression / Assessment and Plan / ED Course  I have reviewed the triage vital signs and the nursing notes.  Pertinent labs & imaging results that were available during my care of the patient were reviewed by me and considered in my medical decision making (see chart for details).     CT imaging with out significant abnormality.  Labs without significant findings.  I think she may have a gastritis.  They may be too early to initiate her on therapy for duodenal ulcer.  I recommended that she has seen a GI doctor for follow-up as she likely will benefit from endoscopy for definitive diagnosis.  In the meantime I'll increase her Prilosec to twice a  day.  She understands to return to the ER for new or worsening symptoms  Final Clinical Impressions(s) / ED Diagnoses   Final diagnoses:  Upper abdominal pain    New Prescriptions New Prescriptions   OMEPRAZOLE (PRILOSEC) 20 MG CAPSULE    Take 1 capsule (20 mg total) by mouth 2 (two) times daily before a meal.     Jola Schmidt, MD 05/11/17 1350

## 2017-05-11 NOTE — ED Notes (Signed)
Patient transported to CT 

## 2017-05-12 ENCOUNTER — Ambulatory Visit (HOSPITAL_COMMUNITY): Admission: RE | Admit: 2017-05-12 | Payer: Medicare Other | Source: Ambulatory Visit

## 2017-05-28 ENCOUNTER — Ambulatory Visit: Payer: Medicare Other | Admitting: Family Medicine

## 2017-05-30 ENCOUNTER — Ambulatory Visit: Payer: Medicare Other | Attending: Family Medicine | Admitting: Family Medicine

## 2017-05-30 ENCOUNTER — Encounter: Payer: Self-pay | Admitting: Family Medicine

## 2017-05-30 VITALS — BP 165/80 | HR 73 | Temp 98.3°F | Resp 18 | Ht 64.0 in | Wt 155.0 lb

## 2017-05-30 DIAGNOSIS — F329 Major depressive disorder, single episode, unspecified: Secondary | ICD-10-CM | POA: Insufficient documentation

## 2017-05-30 DIAGNOSIS — E1151 Type 2 diabetes mellitus with diabetic peripheral angiopathy without gangrene: Secondary | ICD-10-CM | POA: Diagnosis not present

## 2017-05-30 DIAGNOSIS — Z9049 Acquired absence of other specified parts of digestive tract: Secondary | ICD-10-CM | POA: Diagnosis not present

## 2017-05-30 DIAGNOSIS — M545 Low back pain, unspecified: Secondary | ICD-10-CM

## 2017-05-30 DIAGNOSIS — E7849 Other hyperlipidemia: Secondary | ICD-10-CM

## 2017-05-30 DIAGNOSIS — E784 Other hyperlipidemia: Secondary | ICD-10-CM | POA: Diagnosis not present

## 2017-05-30 DIAGNOSIS — I10 Essential (primary) hypertension: Secondary | ICD-10-CM

## 2017-05-30 DIAGNOSIS — Z9889 Other specified postprocedural states: Secondary | ICD-10-CM | POA: Diagnosis not present

## 2017-05-30 DIAGNOSIS — Z90710 Acquired absence of both cervix and uterus: Secondary | ICD-10-CM | POA: Diagnosis not present

## 2017-05-30 DIAGNOSIS — Z880 Allergy status to penicillin: Secondary | ICD-10-CM | POA: Diagnosis not present

## 2017-05-30 DIAGNOSIS — N3281 Overactive bladder: Secondary | ICD-10-CM | POA: Insufficient documentation

## 2017-05-30 DIAGNOSIS — R413 Other amnesia: Secondary | ICD-10-CM

## 2017-05-30 DIAGNOSIS — E119 Type 2 diabetes mellitus without complications: Secondary | ICD-10-CM | POA: Diagnosis not present

## 2017-05-30 DIAGNOSIS — F3289 Other specified depressive episodes: Secondary | ICD-10-CM

## 2017-05-30 LAB — GLUCOSE, POCT (MANUAL RESULT ENTRY): POC GLUCOSE: 157 mg/dL — AB (ref 70–99)

## 2017-05-30 NOTE — Patient Instructions (Signed)

## 2017-05-30 NOTE — Progress Notes (Signed)
Subjective:  Patient ID: Caroline Cooper, female    DOB: [birth year]  Age: 82 y.o. MRN: [id number]  CC: Hypertension   HPI Caroline Cooper s a 82 year old female with a history of hypertension, type 2 diabetes mellitus (A1c 6.1), depression, overactive bladder who comes in today for follow-up of visit.  She had been commenced on Celexa at her last office visit due to a depressed mood and she reports improvement in symptoms but she has had to take half of a 20 mg pill due to feeling jittery with the whole pill.  The dizziness she complained of at her last office visit has resolved the salty taste in her mouth has improved significantly. Head CT revealed age related volume loss, patchy supratentorial small vessel disease, no intracranial mass or hematoma rage, no infarcts, even with arterial vascular calcification and ethmoidal sinus disease bilaterally.  Her blood pressure is elevated today and she has been taking only carvedilol and losartan but not hydralazine. We had discussed working on elimination process to identify if lightheadedness was as a result of one of her antihypertensive but her son who happened to be at the visit with her last time is not here today and she is unable to tell me the outcome of that process. She is accompanied by her daughter today.  Past Medical History:  Diagnosis Date  . Diabetes mellitus without complication ([place])   . Diverticulitis   . DM (diabetes mellitus) ([place]) 09/24/2015  . HTN (hypertension) 09/24/2015  . Hyperlipidemia 09/24/2015  . Hypertension     Past Surgical History:  Procedure Laterality Date  . ABDOMINAL HYSTERECTOMY    . CHOLECYSTECTOMY    . COLON SURGERY     COLECTOMY FOR DIVERTICULITIS  . HERNIA REPAIR      Allergies  Allergen Reactions  . Penicillins     Unknown reaction  Has patient had a PCN reaction causing immediate rash, facial/tongue/throat swelling, SOB or lightheadedness with hypotension: No Has patient had a PCN reaction  causing severe rash involving mucus membranes or skin necrosis: No Has patient had a PCN reaction that required hospitalization No Has patient had a PCN reaction occurring within the last 10 years: No If all of the above answers are "NO", then may proceed with Cephalosporin use.      Outpatient Medications Prior to Visit  Medication Sig Dispense Refill  . atorvastatin (LIPITOR) 40 MG tablet Take 1 tablet (40 mg total) by mouth daily. 90 tablet 1  . carvedilol (COREG) 25 MG tablet TAKE 1 TABLET BY MOUTH TWICE DAILY WITH MEAL 180 tablet 0  . hydrALAZINE (APRESOLINE) 25 MG tablet Take 1 tablet (25 mg total) by mouth 2 (two) times daily. 60 tablet 3  . losartan (COZAAR) 100 MG tablet Take 1 tablet (100 mg total) by mouth daily. 90 tablet 1  . Multiple Vitamins-Minerals (CENTRUM ADULTS) TABS Take 1 tablet by mouth daily.    Marland Kitchen olopatadine (PATANOL) 0.1 % ophthalmic solution Place 1 drop into both eyes 2 (two) times daily. 5 mL 2  . omeprazole (PRILOSEC) 40 MG capsule Take 1 capsule (40 mg total) by mouth 2 (two) times daily before a meal. 20 capsule 0  . citalopram (CELEXA) 20 MG tablet Take 1 tablet (20 mg total) by mouth daily. (Patient not taking: Reported on 05/30/2017) 30 tablet 3  . oxybutynin (DITROPAN) 5 MG tablet Take 1 tablet (5 mg total) by mouth 2 (two) times daily. (Patient not taking: Reported on 05/09/2017) 60 tablet 3   No  facility-administered medications prior to visit.     ROS Review of Systems  Constitutional: Negative for activity change, appetite change and fatigue.  HENT: Negative for congestion, sinus pressure and sore throat.   Eyes: Positive for visual disturbance.  Respiratory: Negative for cough, chest tightness, shortness of breath and wheezing.   Cardiovascular: Negative for chest pain and palpitations.  Gastrointestinal: Negative for abdominal distention, abdominal pain and constipation.  Endocrine: Negative for polydipsia.  Genitourinary: Negative for dysuria  and frequency.  Musculoskeletal: Positive for back pain. Negative for arthralgias.  Skin: Negative for rash.  Neurological: Negative for tremors, light-headedness and numbness.  Hematological: Does not bruise/bleed easily.  Psychiatric/Behavioral: Negative for agitation and behavioral problems.    Objective:  BP (!) 165/80 (BP Location: Right Arm, Patient Position: Sitting, Cuff Size: Large)   Pulse 73   Temp 98.3 F (36.8 C) (Oral)   Resp 18   Ht 5\' 4"  (1.626 m)   Wt 155 lb (70.3 kg)   SpO2 99%   BMI 26.61 kg/m   BP/Weight 05/30/2017 2/63/3354 04/06/2562  Systolic BP 893 734 287  Diastolic BP 80 72 76  Wt. (Lbs) 155 - 155  BMI 26.61 - 28.35      Physical Exam Constitutional: She is oriented to person, place, and time. She appears well-developed and well-nourished.  Cardiovascular: Normal rate, normal heart sounds and intact distal pulses.   No murmur heard. Pulmonary/Chest: Effort normal and breath sounds normal. She has no wheezes. She has no rales. She exhibits no tenderness.  Abdominal: Soft. Bowel sounds are normal. She exhibits no distension and no mass. There is no tenderness.  Musculoskeletal: Normal range of motion.  Neurological: She is alert and oriented to person, place, and time.  Skin: Skin is warm and dry.  Psychiatric: normal mood    Lab Results  Component Value Date   HGBA1C 6.1 05/01/2017    Lipid Panel     Component Value Date/Time   CHOL 146 10/03/2016 0859   TRIG 129 10/03/2016 0859   HDL 48 10/03/2016 0859   CHOLHDL 3.0 10/03/2016 0859   VLDL 26 10/03/2016 0859   LDLCALC 72 10/03/2016 0859    Assessment & Plan:   1. Type 2 diabetes, HbA1c goal < 7% (HCC) Diet controlled with A1c of 6.1 - Glucose (CBG)  2. Other hyperlipidemia Controlled Low-cholesterol diet Continue atorvastatin  3. Other depression Controlled on Celexa Taking half of a 20 mg pill  4. Memory impairment Score 24/30 on Mini-Mental state exam I have discussed  possibility of initiating Aricept with the patient and her daughter-they will need to discuss this with family and get back in touch with me The patient would like to hold off  5. Acute midline low back pain without sciatica Due to lifting involved with relocation Apply heat OTC analgesics as needed  6. Essential hypertension Uncontrolled Low-sodium, DASH diet Advised to resume hydralazine along with carvedilol and losartan Report to the clinic if lightheadedness returns.   No orders of the defined types were placed in this encounter.   Follow-up: Return in about 3 months (around 08/30/2017) for follow up on Hypertension.   Arnoldo Morale MD

## 2017-05-30 NOTE — Progress Notes (Signed)
Patient is here for HTN FU  Patient has taken medication today. Patient has eaten today.  Patient complains of lower back pain being present due to moving. Pain is scaled at an aching 4.

## 2017-07-17 ENCOUNTER — Other Ambulatory Visit: Payer: Self-pay | Admitting: Physician Assistant

## 2017-07-17 DIAGNOSIS — I10 Essential (primary) hypertension: Secondary | ICD-10-CM

## 2017-07-26 DIAGNOSIS — R252 Cramp and spasm: Secondary | ICD-10-CM | POA: Diagnosis not present

## 2017-07-31 ENCOUNTER — Other Ambulatory Visit: Payer: Self-pay | Admitting: Physician Assistant

## 2017-07-31 DIAGNOSIS — I10 Essential (primary) hypertension: Secondary | ICD-10-CM

## 2017-07-31 DIAGNOSIS — E7800 Pure hypercholesterolemia, unspecified: Secondary | ICD-10-CM

## 2017-08-01 ENCOUNTER — Ambulatory Visit: Payer: Medicare Other | Admitting: Family Medicine

## 2017-08-02 ENCOUNTER — Encounter (HOSPITAL_COMMUNITY): Payer: Self-pay | Admitting: *Deleted

## 2017-08-02 ENCOUNTER — Emergency Department (HOSPITAL_COMMUNITY)
Admission: EM | Admit: 2017-08-02 | Discharge: 2017-08-02 | Disposition: A | Discharge status: Home / Self Care | Payer: Medicare Other | Attending: Emergency Medicine | Admitting: Emergency Medicine

## 2017-08-02 DIAGNOSIS — I10 Essential (primary) hypertension: Secondary | ICD-10-CM | POA: Diagnosis not present

## 2017-08-02 DIAGNOSIS — E119 Type 2 diabetes mellitus without complications: Secondary | ICD-10-CM | POA: Insufficient documentation

## 2017-08-02 DIAGNOSIS — Z79899 Other long term (current) drug therapy: Secondary | ICD-10-CM | POA: Diagnosis not present

## 2017-08-02 DIAGNOSIS — R252 Cramp and spasm: Secondary | ICD-10-CM | POA: Insufficient documentation

## 2017-08-02 LAB — I-STAT CHEM 8, ED
BUN: 26 mg/dL — AB (ref 6–20)
CHLORIDE: 100 mmol/L — AB (ref 101–111)
Calcium, Ion: 1.09 mmol/L — ABNORMAL LOW (ref 1.15–1.40)
Creatinine, Ser: 0.7 mg/dL (ref 0.44–1.00)
GLUCOSE: 92 mg/dL (ref 65–99)
HCT: 39 % (ref 36.0–46.0)
HEMOGLOBIN: 13.3 g/dL (ref 12.0–15.0)
Potassium: 4 mmol/L (ref 3.5–5.1)
Sodium: 138 mmol/L (ref 135–145)
TCO2: 28 mmol/L (ref 22–32)

## 2017-08-02 NOTE — ED Provider Notes (Signed)
[facility] DEPT Provider Note   CSN: [id number] Arrival date & time: 08/02/17  1052     History   Chief Complaint Chief Complaint  Patient presents with  . Leg Pain    HPI Caroline Cooper is a 82 y.o. female.Complains of anterior left thigh pain radiating to the knee onset 3 weeks ago feels like a muscle cramp pain is worse with sitting still improved with walking. She saw an urgent care center has been treated with Flexeril, prednisone and diclofenac with minimal relief. She's also taken Tylenol with minimal relief. Her daughter advised to stop prednisone, Flexeril and diclofenacHPI  she presents today as her daughter had a remote concern that she may have a blood clot in her leg. No other associated symptom no fever. No chest pain or shortness of breath.  Past Medical History:  Diagnosis Date  . Diabetes mellitus without complication ([place])   . Diverticulitis   . DM (diabetes mellitus) ([place]) 09/24/2015  . HTN (hypertension) 09/24/2015  . Hyperlipidemia 09/24/2015  . Hypertension     Patient Active Problem List   Diagnosis Date Noted  . Depression 05/09/2017  . Overactive bladder 11/19/2016  . 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 Seasonal allergies 03/14/2016  . Need for vaccination for H flu type B with pedvaxHIB 10/03/2015  . Neck pain   . Type 2 diabetes, HbA1c goal < 7% (HCC)   . Elevated troponin 09/24/2015  . Neck pain on right side 09/24/2015  . Pain of right upper extremity 09/24/2015  . HTN (hypertension) 09/24/2015  . Diabetes mellitus without complication ([place]) [id number]/[id number]  . Hyperlipidemia 09/24/2015  . Leukocytosis 09/24/2015  . Colitis:/ DIAGNOSED 09/18/2015 09/24/2015    Past Surgical History:  Procedure Laterality Date  . ABDOMINAL HYSTERECTOMY    . CHOLECYSTECTOMY    . COLON SURGERY     COLECTOMY FOR DIVERTICULITIS  . HERNIA REPAIR      OB History    No data available       Home Medications    Prior to Admission medications   Medication Sig  Start Date End Date Taking? Authorizing Provider  atorvastatin (LIPITOR) 40 MG tablet TAKE 1 TABLET BY MOUTH EVERY DAY 08/01/17   Arnoldo Morale, MD  carvedilol (COREG) 25 MG tablet TAKE 1 TABLET BY MOUTH TWICE DAILY WITH MEAL 05/01/17   Freeman Caldron M, PA-C  citalopram (CELEXA) 20 MG tablet Take 1 tablet (20 mg total) by mouth daily. Patient not taking: Reported on 05/30/2017 05/09/17   Arnoldo Morale, MD  hydrALAZINE (APRESOLINE) 25 MG tablet TAKE 1 TABLET BY MOUTH TWO TIMES A DAY 08/01/17   Arnoldo Morale, MD  losartan (COZAAR) 100 MG tablet Take 1 tablet (100 mg total) by mouth daily. 05/01/17   Argentina Donovan, PA-C  Multiple Vitamins-Minerals (CENTRUM ADULTS) TABS Take 1 tablet by mouth daily.    [provider]  olopatadine (PATANOL) 0.1 % ophthalmic solution Place 1 drop into both eyes 2 (two) times daily. 05/09/17   Arnoldo Morale, MD  omeprazole (PRILOSEC) 40 MG capsule Take 1 capsule (40 mg total) by mouth 2 (two) times daily before a meal. 05/11/17   Jola Schmidt, MD    Family History No family history on file.  Social History Social History  Substance Use Topics  . Smoking status: Never Smoker  . Smokeless tobacco: Never Used  . Alcohol use No     Allergies   Penicillins   Review of Systems Review of Systems  Constitutional: Negative.   HENT: Negative.  Respiratory: Negative.   Cardiovascular: Negative.   Gastrointestinal: Negative.   Musculoskeletal: Positive for myalgias.       Left thigh  Skin: Negative.   Neurological: Negative.   Psychiatric/Behavioral: Negative.   All other systems reviewed and are negative.    Physical Exam Updated Vital Signs BP (!) 142/73   Pulse 67   Temp 97.9 F (36.6 C)   Resp 14   SpO2 100%   Physical Exam  Constitutional: She appears well-developed and well-nourished.  HENT:  Head: Normocephalic and atraumatic.  Eyes: Pupils are equal, round, and reactive to light. Conjunctivae are normal.  Neck: Neck supple. No  tracheal deviation present. No thyromegaly present.  Cardiovascular: Normal rate and regular rhythm.   No murmur heard. Pulmonary/Chest: Effort normal and breath sounds normal.  Abdominal: Soft. Bowel sounds are normal. She exhibits no distension. There is no tenderness.  Musculoskeletal: Normal range of motion. She exhibits no edema or tenderness.  Left lower extremity no redness no swelling no tenderness. Neurovascular intact. DP pulse 2+ laterally. Good capillary refill all other extremities without redness swelling or tenderness neurovascularly intact  Neurological: She is alert. Coordination normal.  Skin: Skin is warm and dry. Capillary refill takes less than 2 seconds. No rash noted.  Psychiatric: She has a normal mood and affect.  Nursing note and vitals reviewed.    ED Treatments / Results  Labs (all labs ordered are listed, but only abnormal results are displayed) Labs Reviewed  I-STAT CHEM 8, ED    EKG  EKG Interpretation None       Radiology No results found.  Procedures Procedures (including critical care time)  Medications Ordered in ED Medications - No data to display Results for orders placed or performed during the hospital encounter of 08/02/17  I-stat chem 8, ed  Result Value Ref Range   Sodium 138 135 - 145 mmol/L   Potassium 4.0 3.5 - 5.1 mmol/L   Chloride 100 (L) 101 - 111 mmol/L   BUN 26 (H) 6 - 20 mg/dL   Creatinine, Ser 0.70 0.44 - 1.00 mg/dL   Glucose, Bld 92 65 - 99 mg/dL   Calcium, Ion 1.09 (L) 1.15 - 1.40 mmol/L   TCO2 28 22 - 32 mmol/L   Hemoglobin 13.3 12.0 - 15.0 g/dL   HCT 39.0 36.0 - 46.0 %   No results found.  Initial Impression / Assessment and Plan / ED Course  I have reviewed the triage vital signs and the nursing notes.  Pertinent labs & imaging results that were available during my care of the patient were reviewed by me and considered in my medical decision making (see chart for details). 1:05 PM patient is able to and  dry without difficulty. Leg discomfort is minimal.   Clinically had no suspicion of infection or phlebitis. No redness no swelling no tenderness symptoms intermittent. Crampy in nature at the anterior thigh  Symptoms consistent with muscle cramps  Plan discontinue diclofenac, cyclobenzaprine and prednisone. Tylenol for pain. Warm compresses Final Clinical Impressions(s) / ED Diagnoses  Diagnosis muscle cramps Final diagnoses:  None    New Prescriptions New Prescriptions   No medications on file     Orlie Dakin, MD 08/02/17 1700

## 2017-08-02 NOTE — Discharge Instructions (Signed)
Discontinue diclofenac, cyclobenzaprine and prednisone. Take Tylenol as directed for pain every 4 hours as needed. See your primary care physician if not improving in a week. Return if concern for any reason

## 2017-08-02 NOTE — ED Triage Notes (Signed)
Pt complains of left knee pain for the past month. Pt went to urgent care last week, was prescribed prednisone, diclofenac, and cyclobenzaprine. Pt states medications are not helping.

## 2017-08-10 ENCOUNTER — Encounter (HOSPITAL_COMMUNITY): Payer: Self-pay | Admitting: Emergency Medicine

## 2017-08-10 ENCOUNTER — Emergency Department (HOSPITAL_COMMUNITY)
Admission: EM | Admit: 2017-08-10 | Discharge: 2017-08-10 | Disposition: A | Discharge status: Home / Self Care | Payer: Medicare Other | Attending: Emergency Medicine | Admitting: Emergency Medicine

## 2017-08-10 ENCOUNTER — Emergency Department (HOSPITAL_COMMUNITY): Payer: Medicare Other

## 2017-08-10 DIAGNOSIS — M79605 Pain in left leg: Secondary | ICD-10-CM | POA: Diagnosis not present

## 2017-08-10 DIAGNOSIS — M79662 Pain in left lower leg: Secondary | ICD-10-CM | POA: Diagnosis not present

## 2017-08-10 DIAGNOSIS — Z79899 Other long term (current) drug therapy: Secondary | ICD-10-CM | POA: Insufficient documentation

## 2017-08-10 DIAGNOSIS — M545 Low back pain: Secondary | ICD-10-CM | POA: Diagnosis not present

## 2017-08-10 DIAGNOSIS — I10 Essential (primary) hypertension: Secondary | ICD-10-CM | POA: Insufficient documentation

## 2017-08-10 LAB — BASIC METABOLIC PANEL
Anion gap: 10 (ref 5–15)
BUN: 18 mg/dL (ref 6–20)
CO2: 25 mmol/L (ref 22–32)
Calcium: 9.7 mg/dL (ref 8.9–10.3)
Chloride: 102 mmol/L (ref 101–111)
Creatinine, Ser: 0.81 mg/dL (ref 0.44–1.00)
GFR calc Af Amer: >60 mL/min (ref >60)
GLUCOSE: 110 mg/dL — AB (ref 65–99)
POTASSIUM: 4.1 mmol/L (ref 3.5–5.1)
Sodium: 137 mmol/L (ref 135–145)

## 2017-08-10 LAB — D-DIMER, QUANTITATIVE: D-Dimer, Quant: <0.27 ug/mL-FEU (ref 0.00–0.50)

## 2017-08-10 MED ORDER — HYDROCODONE-ACETAMINOPHEN 5-325 MG PO TABS
1.0000 | ORAL_TABLET | Freq: Once | ORAL | Status: AC
Start: 2017-08-10 — End: 2017-08-10
  Administered 2017-08-10: 1 via ORAL
  Filled 2017-08-10: qty 1

## 2017-08-10 MED ORDER — PREGABALIN 25 MG PO CAPS: 25.0000 mg | ORAL_CAPSULE | Freq: Two times a day (BID) | ORAL | 0 refills | Status: DC

## 2017-08-10 NOTE — ED Notes (Signed)
Pt ambulatory to restroom with RN assisting

## 2017-08-10 NOTE — ED Provider Notes (Signed)
[facility] DEPT Provider Note   CSN: [id number] Arrival date & time: 08/10/17  1310     History   Chief Complaint Chief Complaint  Patient presents with  . Leg Pain    L leg    HPI Caroline Cooper is a 82 y.o. female.  HPI  She presents with her son who assists with the history of present illness. Patient has had pain in the left lower extremity for approximately 3 weeks, and today notes the pain is more severe in spite of using recently prescribed medication. No clear precipitant, but the patient recalls that 3 weeks ago she felt sudden onset pain throughout her left calf. Subsequently the pain began to radiate superiorly, and she now notes that at rest she has pain in her calf, and with activity she has pain in the left lateral thigh, and left posterior hip. No new chest pain, dyspnea, fever, chills. Patient was discharged with muscle relaxant, steroid, Tylenol, has been taking medication as directed, has not seen an orthopedist or a neurosurgeon or her primary care physician since her evaluation one week ago.    Past Medical History:  Diagnosis Date  . Diabetes mellitus without complication ([place])   . Diverticulitis   . DM (diabetes mellitus) ([place]) 09/24/2015  . HTN (hypertension) 09/24/2015  . Hyperlipidemia 09/24/2015  . Hypertension     Patient Active Problem List   Diagnosis Date Noted  . Depression 05/09/2017  . Overactive bladder 11/19/2016  . 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 Seasonal allergies 03/14/2016  . Need for vaccination for H flu type B with pedvaxHIB 10/03/2015  . Neck pain   . Type 2 diabetes, HbA1c goal < 7% ([facility])   . Elevated troponin 09/24/2015  . Neck pain on right side 09/24/2015  . Pain of right upper extremity 09/24/2015  . HTN (hypertension) 09/24/2015  . Diabetes mellitus without complication ([place]) 63/78/5885  . Hyperlipidemia 09/24/2015  . Leukocytosis 09/24/2015  . Colitis:/ DIAGNOSED 09/18/2015 09/24/2015    Past Surgical History:   Procedure Laterality Date  . ABDOMINAL HYSTERECTOMY    . CHOLECYSTECTOMY    . COLON SURGERY     COLECTOMY FOR DIVERTICULITIS  . HERNIA REPAIR      OB History    No data available       Home Medications    Prior to Admission medications   Medication Sig Start Date End Date Taking? Authorizing Provider  atorvastatin (LIPITOR) 40 MG tablet TAKE 1 TABLET BY MOUTH EVERY DAY 08/01/17   Arnoldo Morale, MD  carvedilol (COREG) 25 MG tablet TAKE 1 TABLET BY MOUTH TWICE DAILY WITH MEAL 05/01/17   Freeman Caldron M, PA-C  citalopram (CELEXA) 20 MG tablet Take 1 tablet (20 mg total) by mouth daily. Patient not taking: Reported on 05/30/2017 05/09/17   Arnoldo Morale, MD  hydrALAZINE (APRESOLINE) 25 MG tablet TAKE 1 TABLET BY MOUTH TWO TIMES A DAY 08/01/17   Arnoldo Morale, MD  losartan (COZAAR) 100 MG tablet Take 1 tablet (100 mg total) by mouth daily. 05/01/17   Argentina Donovan, PA-C  Multiple Vitamins-Minerals (CENTRUM ADULTS) TABS Take 1 tablet by mouth daily.    [provider]  olopatadine (PATANOL) 0.1 % ophthalmic solution Place 1 drop into both eyes 2 (two) times daily. 05/09/17   Arnoldo Morale, MD  omeprazole (PRILOSEC) 40 MG capsule Take 1 capsule (40 mg total) by mouth 2 (two) times daily before a meal. 05/11/17   Jola Schmidt, MD    Family History History reviewed.  No pertinent family history.  Social History Social History  Substance Use Topics  . Smoking status: Never Smoker  . Smokeless tobacco: Never Used  . Alcohol use No     Allergies   Penicillins   Review of Systems Review of Systems  Constitutional:       Per HPI, otherwise negative  HENT:       Per HPI, otherwise negative  Respiratory:       Per HPI, otherwise negative  Cardiovascular:       Per HPI, otherwise negative  Gastrointestinal: Negative for vomiting.  Endocrine:       Negative aside from HPI  Genitourinary:       Neg aside from HPI   Musculoskeletal:       Per HPI, otherwise negative    Skin: Negative.   Neurological: Negative for syncope.     Physical Exam Updated Vital Signs BP (!) 178/92 (BP Location: Left Arm)   Pulse 63   Temp 98 F (36.7 C) (Oral)   Resp 18   SpO2 95%   Physical Exam  Constitutional: She is oriented to person, place, and time. She appears well-developed and well-nourished. No distress.  HENT:  Head: Normocephalic and atraumatic.  Eyes: Conjunctivae and EOM are normal.  Cardiovascular: Normal rate and regular rhythm.   Pulmonary/Chest: Effort normal and breath sounds normal. No stridor. No respiratory distress.  Abdominal: She exhibits no distension.  Musculoskeletal: She exhibits no edema.  Legs are symmetric, nontender to palpation him a no edema, no induration. Patient notes pain in the calf with ankle or knee flexion or extension, but no thigh pain passively or actively.   Neurological: She is alert and oriented to person, place, and time. No cranial nerve deficit.  Skin: Skin is warm and dry.  Psychiatric: She has a normal mood and affect.  Nursing note and vitals reviewed.    ED Treatments / Results  Labs (all labs ordered are listed, but only abnormal results are displayed) Labs Reviewed  BASIC METABOLIC PANEL - Abnormal; Notable for the following:       Result Value   Glucose, Bld 110 (*)    All other components within normal limits  D-DIMER, QUANTITATIVE (NOT AT [facility])   Radiology Dg Lumbar Spine Complete  Result Date: 08/10/2017 CLINICAL DATA:  Patient with left greater than right lower back pain for 1 month. Pain radiating down into the calf. EXAM: LUMBAR SPINE - COMPLETE 4+ VIEW COMPARISON:  CT abdomen pelvis 05/11/2017 FINDINGS: Mild leftward curvature of the lumbar spine. Preservation of the vertebral body heights. Multilevel degenerative disc disease most pronounced L5-S1. Aortic atherosclerosis. SI joint degenerative changes. Left abdominal surgical clips. IMPRESSION: No acute osseous abnormality.  Lumbar spine  degenerative changes. Electronically Signed   By: Lovey Newcomer M.D.   On: 08/10/2017 16:36    Procedures Procedures (including critical care time)  Medications Ordered in ED Medications  HYDROcodone-acetaminophen (NORCO/VICODIN) 5-325 MG per tablet 1 tablet (1 tablet Oral Given 08/10/17 1647)     Initial Impression / Assessment and Plan / ED Course  I have reviewed the triage vital signs and the nursing notes.  Pertinent labs & imaging results that were available during my care of the patient were reviewed by me and considered in my medical decision making (see chart for details).     Chart review notable for visit one week ago, with diagnosis of muscle cramp.  Patient presents with concern of ongoing left-sided extremity pain. Here the patient  is awake, alert, neurologically intact, hemodynamically stable with no notable physical findings beyond pain in the distal left lower extremity with motion, and description of pain in the left proximal lower extremity with ambulation. No evidence for fracture, thromboembolism, infection, neurologic compromise. Patient has had little success with pain relief in spite of using previous prescriptions, will receive additional analgesia, different, and orthopedic follow-up referral. Final Clinical Impressions(s) / ED Diagnoses  Left lower extremity pain   Carmin Muskrat, MD 08/10/17 1818

## 2017-08-10 NOTE — ED Triage Notes (Addendum)
Pt c/o cramping to LLE. Pt states it is intermittent and not associated with activity or rest. Pt states she has tried Tylenol as prescribed from ED visit on 9.1.2018 for same symptoms. Pt d/x with cramps to LLE. Pt states she has not seen primary care for further care. Pt denies injury.

## 2017-08-10 NOTE — ED Notes (Signed)
Pt verbalized understanding of discharge instructions.

## 2017-08-10 NOTE — Discharge Instructions (Signed)
As discussed, today's evaluation has been generally reassuring. Your pain is likely due to inflammation at the base of the nerves controlling sensation and strength of your leg. You are being started on a new medication.  Please monitor your condition carefully, and do not hesitate to return here for concerning changes.

## 2017-08-18 DIAGNOSIS — M48062 Spinal stenosis, lumbar region with neurogenic claudication: Secondary | ICD-10-CM | POA: Diagnosis not present

## 2017-08-19 ENCOUNTER — Other Ambulatory Visit: Payer: Self-pay | Admitting: Orthopedic Surgery

## 2017-08-19 DIAGNOSIS — M48062 Spinal stenosis, lumbar region with neurogenic claudication: Secondary | ICD-10-CM

## 2017-08-29 ENCOUNTER — Ambulatory Visit
Admission: RE | Admit: 2017-08-29 | Discharge: 2017-08-29 | Disposition: A | Discharge status: Home / Self Care | Payer: Medicare Other | Source: Ambulatory Visit | Attending: Orthopedic Surgery | Admitting: Orthopedic Surgery

## 2017-08-29 DIAGNOSIS — M48062 Spinal stenosis, lumbar region with neurogenic claudication: Secondary | ICD-10-CM

## 2017-08-29 DIAGNOSIS — M48061 Spinal stenosis, lumbar region without neurogenic claudication: Secondary | ICD-10-CM | POA: Diagnosis not present

## 2017-08-29 MED ORDER — IOPAMIDOL (ISOVUE-M 200) INJECTION 41%
15.0000 mL | Freq: Once | INTRAMUSCULAR | Status: AC
  Administered 2017-08-29: 15 mL via INTRATHECAL

## 2017-08-29 MED ORDER — ONDANSETRON HCL 4 MG/2ML IJ SOLN
4.0000 mg | Freq: Once | INTRAMUSCULAR | Status: AC
  Administered 2017-08-29: 4 mg via INTRAMUSCULAR

## 2017-08-29 MED ORDER — DIAZEPAM 5 MG PO TABS
5.0000 mg | ORAL_TABLET | Freq: Once | ORAL | Status: AC
  Administered 2017-08-29: 5 mg via ORAL

## 2017-08-29 MED ORDER — MEPERIDINE HCL 100 MG/ML IJ SOLN
50.0000 mg | Freq: Once | INTRAMUSCULAR | Status: AC
  Administered 2017-08-29: 50 mg via INTRAMUSCULAR

## 2017-08-29 NOTE — Discharge Instructions (Signed)

## 2017-09-03 ENCOUNTER — Other Ambulatory Visit: Payer: Self-pay | Admitting: Family Medicine

## 2017-09-03 ENCOUNTER — Ambulatory Visit: Payer: Medicare Other | Attending: Family Medicine | Admitting: Family Medicine

## 2017-09-03 ENCOUNTER — Encounter: Payer: Self-pay | Admitting: Family Medicine

## 2017-09-03 VITALS — BP 164/82 | HR 61 | Temp 97.9°F | Ht 64.0 in | Wt 152.2 lb

## 2017-09-03 DIAGNOSIS — G8929 Other chronic pain: Secondary | ICD-10-CM | POA: Insufficient documentation

## 2017-09-03 DIAGNOSIS — Z9889 Other specified postprocedural states: Secondary | ICD-10-CM | POA: Insufficient documentation

## 2017-09-03 DIAGNOSIS — K219 Gastro-esophageal reflux disease without esophagitis: Secondary | ICD-10-CM | POA: Diagnosis not present

## 2017-09-03 DIAGNOSIS — Z880 Allergy status to penicillin: Secondary | ICD-10-CM | POA: Diagnosis not present

## 2017-09-03 DIAGNOSIS — Z9114 Patient's other noncompliance with medication regimen: Secondary | ICD-10-CM | POA: Diagnosis not present

## 2017-09-03 DIAGNOSIS — Z9049 Acquired absence of other specified parts of digestive tract: Secondary | ICD-10-CM | POA: Insufficient documentation

## 2017-09-03 DIAGNOSIS — E119 Type 2 diabetes mellitus without complications: Secondary | ICD-10-CM | POA: Diagnosis not present

## 2017-09-03 DIAGNOSIS — M48061 Spinal stenosis, lumbar region without neurogenic claudication: Secondary | ICD-10-CM | POA: Diagnosis not present

## 2017-09-03 DIAGNOSIS — F3289 Other specified depressive episodes: Secondary | ICD-10-CM

## 2017-09-03 DIAGNOSIS — N3281 Overactive bladder: Secondary | ICD-10-CM | POA: Diagnosis not present

## 2017-09-03 DIAGNOSIS — M25562 Pain in left knee: Secondary | ICD-10-CM | POA: Diagnosis not present

## 2017-09-03 DIAGNOSIS — Z90710 Acquired absence of both cervix and uterus: Secondary | ICD-10-CM | POA: Insufficient documentation

## 2017-09-03 DIAGNOSIS — I10 Essential (primary) hypertension: Secondary | ICD-10-CM

## 2017-09-03 DIAGNOSIS — E7800 Pure hypercholesterolemia, unspecified: Secondary | ICD-10-CM | POA: Diagnosis not present

## 2017-09-03 LAB — GLUCOSE, POCT (MANUAL RESULT ENTRY): POC GLUCOSE: 89 mg/dL (ref 70–99)

## 2017-09-03 LAB — POCT GLYCOSYLATED HEMOGLOBIN (HGB A1C): Hemoglobin A1C: 5.9

## 2017-09-03 MED ORDER — HYDRALAZINE HCL 25 MG PO TABS: 25.0000 mg | ORAL_TABLET | Freq: Two times a day (BID) | ORAL | 1 refills | Status: DC

## 2017-09-03 MED ORDER — CARVEDILOL 25 MG PO TABS: ORAL_TABLET | ORAL | 1 refills | Status: DC

## 2017-09-03 MED ORDER — TRAMADOL HCL 50 MG PO TABS: 50.0000 mg | ORAL_TABLET | Freq: Two times a day (BID) | ORAL | 0 refills | Status: DC | PRN

## 2017-09-03 MED ORDER — OMEPRAZOLE 20 MG PO CPDR: 20.0000 mg | DELAYED_RELEASE_CAPSULE | Freq: Two times a day (BID) | ORAL | 1 refills | Status: DC

## 2017-09-03 MED ORDER — LOSARTAN POTASSIUM 100 MG PO TABS: 100.0000 mg | ORAL_TABLET | Freq: Every day | ORAL | 1 refills | Status: DC

## 2017-09-03 MED ORDER — ATORVASTATIN CALCIUM 40 MG PO TABS: 40.0000 mg | ORAL_TABLET | Freq: Every day | ORAL | 1 refills | Status: DC

## 2017-09-03 NOTE — Patient Instructions (Signed)

## 2017-09-03 NOTE — Progress Notes (Signed)
Subjective:  Patient ID: Caroline Cooper, female    DOB: 07/28/1935  Age: 82 y.o. MRN: [id number]  CC: Hypertension and Diabetes   HPI Caroline Cooper is a 82 year old female with a history of hypertension, type 2 diabetes mellitus (A1c 5.9), depression, overactive bladder who comes in today for a follow-up of visit accompanied by her son.  Her blood pressure is elevated and she endorses not taking carvedilol and hydralazine because they made her stomach knot up. On review of her med list she was prescribed omeprazole which she has not been taking. For diabetes she remains on diet control. With regards to depression she currently does not take any medications and has resisted distant past because "she feels well".  She complains of left knee pain which is rated as a 5/10; it initially extended from her thigh to her legs but not she feels it only in her left knee. She had been seen at the ED for muscle cramps and left lower extremity pain for which she was prescribed muscle relaxants and was referred to orthopedics - follow-up appointment with [facility] orthopedics is tomorrow. Tylenol has not helped with symptoms and pain affects her ambulation. She does not like the idea of using a cane to ambulate.  CT lumbar spine with contrast from 08/21/17: IMPRESSION: LUMBAR MYELOGRAM IMPRESSION:  Two level spinal stenosis at L3-4, and L4-5, related to disc material and posterior element hypertrophy.  Upright radiographs do not demonstrate dynamic instability, but do suggest worsening stenosis with patient upright, most prominent at L3-4.  CT LUMBAR MYELOGRAM IMPRESSION:  Mild to moderate stenosis at L3-4 relates to central and leftward protrusion with posterior element hypertrophy. LEFT L4 and/or LEFT L3 nerve root impingement are possible.  Central protrusion at L4-5 in conjunction with posterior element hypertrophy results in mild stenosis. BILATERAL foraminal narrowing is the most  notable feature at this level.  Disc space narrowing at L5-S1, with central and rightward protrusion and BILATERAL foraminal narrowing. Symptomatic LEFT L5 nerve root impingement is possible.  Past Medical History:  Diagnosis Date  . Diabetes mellitus without complication ([place])   . Diverticulitis   . DM (diabetes mellitus) ([place]) 09/24/2015  . HTN (hypertension) 09/24/2015  . Hyperlipidemia 09/24/2015  . Hypertension     Past Surgical History:  Procedure Laterality Date  . ABDOMINAL HYSTERECTOMY    . CHOLECYSTECTOMY    . COLON SURGERY     COLECTOMY FOR DIVERTICULITIS  . HERNIA REPAIR      Allergies  Allergen Reactions  . Penicillins     Unknown reaction  Has patient had a PCN reaction causing immediate rash, facial/tongue/throat swelling, SOB or lightheadedness with hypotension: No Has patient had a PCN reaction causing severe rash involving mucus membranes or skin necrosis: No Has patient had a PCN reaction that required hospitalization No Has patient had a PCN reaction occurring within the last 10 years: No If all of the above answers are "NO", then may proceed with Cephalosporin use.      Outpatient Medications Prior to Visit  Medication Sig Dispense Refill  . acetaminophen (TYLENOL) 500 MG tablet Take 1,000 mg by mouth every 8 (eight) hours as needed for moderate pain or headache.    . olopatadine (PATANOL) 0.1 % ophthalmic solution Place 1 drop into both eyes 2 (two) times daily. 5 mL 2  . atorvastatin (LIPITOR) 40 MG tablet TAKE 1 TABLET BY MOUTH EVERY DAY 90 tablet 0  . losartan (COZAAR) 100 MG tablet Take 1 tablet (100 mg total)  by mouth daily. 90 tablet 1  . carvedilol (COREG) 25 MG tablet TAKE 1 TABLET BY MOUTH TWICE DAILY WITH MEAL (Patient not taking: Reported on 09/03/2017) 180 tablet 0  . hydrALAZINE (APRESOLINE) 25 MG tablet TAKE 1 TABLET BY MOUTH TWO TIMES A DAY (Patient not taking: Reported on 09/03/2017) 60 tablet 0  . omeprazole (PRILOSEC) 40 MG capsule  Take 1 capsule (40 mg total) by mouth 2 (two) times daily before a meal. (Patient not taking: Reported on 09/03/2017) 20 capsule 0   No facility-administered medications prior to visit.     ROS Review of Systems  Constitutional: Negative for activity change, appetite change and fatigue.  HENT: Negative for congestion, sinus pressure and sore throat.   Eyes: Negative for visual disturbance.  Respiratory: Negative for cough, chest tightness, shortness of breath and wheezing.   Cardiovascular: Negative for chest pain and palpitations.  Gastrointestinal: Negative for abdominal distention, abdominal pain and constipation.  Endocrine: Negative for polydipsia.  Genitourinary: Negative for dysuria and frequency.  Musculoskeletal:       See hpi  Skin: Negative for rash.  Neurological: Negative for tremors, light-headedness and numbness.  Hematological: Does not bruise/bleed easily.  Psychiatric/Behavioral: Negative for agitation and behavioral problems.    Objective:  BP (!) 164/82   Pulse 61   Temp 97.9 F (36.6 C) (Oral)   Ht 5' 4"  (1.626 m)   Wt 152 lb 3.2 oz (69 kg)   SpO2 99%   BMI 26.13 kg/m   BP/Weight 09/03/2017 01/05/2682 03/02/9621  Systolic BP 297 989 211  Diastolic BP 82 80 73  Wt. (Lbs) 152.2 - -  BMI 26.13 - -      Physical Exam  Constitutional: She is oriented to person, place, and time. She appears well-developed and well-nourished.  Cardiovascular: Normal rate, normal heart sounds and intact distal pulses.   No murmur heard. Pulmonary/Chest: Effort normal and breath sounds normal. She has no wheezes. She has no rales. She exhibits no tenderness.  Abdominal: Soft. Bowel sounds are normal. She exhibits no distension and no mass. There is no tenderness.  Musculoskeletal: She exhibits edema (slight left knee edema; right is normal). She exhibits no tenderness (No tenderness elicited on range of motionof the left knee; right is normal).  Neurological: She is alert and  oriented to person, place, and time.  Skin: Skin is warm and dry.  Psychiatric: She has a normal mood and affect.    CMP Latest Ref Rng & Units 08/10/2017 08/02/2017 05/11/2017  Glucose 65 - 99 mg/dL 110(H) 92 130(H)  BUN 6 - 20 mg/dL 18 26(H) 14  Creatinine 0.44 - 1.00 mg/dL 0.81 0.70 0.85  Sodium 135 - 145 mmol/L 137 138 141  Potassium 3.5 - 5.1 mmol/L 4.1 4.0 4.4  Chloride 101 - 111 mmol/L 102 100(L) 105  CO2 22 - 32 mmol/L 25 - 28  Calcium 8.9 - 10.3 mg/dL 9.7 - 9.2  Total Protein 6.5 - 8.1 g/dL - - 7.5  Total Bilirubin 0.3 - 1.2 mg/dL - - 0.9  Alkaline Phos 38 - 126 U/L - - 68  AST 15 - 41 U/L - - 35  ALT 14 - 54 U/L - - 20    Lipid Panel     Component Value Date/Time   CHOL 146 10/03/2016 0859   TRIG 129 10/03/2016 0859   HDL 48 10/03/2016 0859   CHOLHDL 3.0 10/03/2016 0859   VLDL 26 10/03/2016 0859   LDLCALC 72 10/03/2016 0859  Lab Results  Component Value Date   HGBA1C 5.9 09/03/2017    Assessment & Plan:   1. Diabetes mellitus without complication (HCC) Rate controlled with A1c of 5.9 Diabetic diet - POCT glucose (manual entry) - POCT glycosylated hemoglobin (Hb A1C) - CMP14+EGFR; Future - Lipid panel; Future - Microalbumin/Creatinine Ratio, Urine; Future  2. Essential hypertension Uncontrolled due to noncompliance Emphasized compliance with medications Low-sodium diet - losartan (COZAAR) 100 MG tablet; Take 1 tablet (100 mg total) by mouth daily.  Dispense: 90 tablet; Refill: 1 - carvedilol (COREG) 25 MG tablet; TAKE 1 TABLET BY MOUTH TWICE DAILY WITH MEAL  Dispense: 180 tablet; Refill: 1 - hydrALAZINE (APRESOLINE) 25 MG tablet; Take 1 tablet (25 mg total) by mouth 2 (two) times daily.  Dispense: 180 tablet; Refill: 1  3. Pure hypercholesterolemia Controlled - atorvastatin (LIPITOR) 40 MG tablet; Take 1 tablet (40 mg total) by mouth daily.  Dispense: 90 tablet; Refill: 1  4. Chronic pain of left knee Placed on tramadol Advised to keep appointment  with orthopedics tomorrow If pain persist will consider placing on NSAIDs long-term  5. Spinal stenosis of lumbar region without neurogenic claudication She denies back. At this time  6. GERD Noncompliance with omeprazole could explain abdominal symptoms I have refilled omeprazole and advised her to take this in addition to her antihypertensives.  Meds ordered this encounter  Medications  . traMADol (ULTRAM) 50 MG tablet    Sig: Take 1 tablet (50 mg total) by mouth every 12 (twelve) hours as needed.    Dispense:  30 tablet    Refill:  0  . omeprazole (PRILOSEC) 20 MG capsule    Sig: Take 1 capsule (20 mg total) by mouth 2 (two) times daily before a meal.    Dispense:  90 capsule    Refill:  1  . losartan (COZAAR) 100 MG tablet    Sig: Take 1 tablet (100 mg total) by mouth daily.    Dispense:  90 tablet    Refill:  1  . atorvastatin (LIPITOR) 40 MG tablet    Sig: Take 1 tablet (40 mg total) by mouth daily.    Dispense:  90 tablet    Refill:  1  . carvedilol (COREG) 25 MG tablet    Sig: TAKE 1 TABLET BY MOUTH TWICE DAILY WITH MEAL    Dispense:  180 tablet    Refill:  1  . hydrALAZINE (APRESOLINE) 25 MG tablet    Sig: Take 1 tablet (25 mg total) by mouth 2 (two) times daily.    Dispense:  180 tablet    Refill:  1    Follow-up: Return in about 3 months (around 12/04/2017) for follow up of Diabetes.   Arnoldo Morale MD

## 2017-09-04 DIAGNOSIS — M48062 Spinal stenosis, lumbar region with neurogenic claudication: Secondary | ICD-10-CM | POA: Diagnosis not present

## 2017-09-10 ENCOUNTER — Ambulatory Visit: Payer: Medicare Other | Attending: Family Medicine

## 2017-09-10 DIAGNOSIS — E119 Type 2 diabetes mellitus without complications: Secondary | ICD-10-CM | POA: Diagnosis not present

## 2017-09-10 NOTE — Progress Notes (Signed)
Patient here for lab visit only 

## 2017-09-11 LAB — CMP14+EGFR
ALBUMIN: 4.4 g/dL (ref 3.5–4.7)
ALK PHOS: 62 IU/L (ref 39–117)
ALT: 11 IU/L (ref 0–32)
AST: 18 IU/L (ref 0–40)
Albumin/Globulin Ratio: 1.7 (ref 1.2–2.2)
BILIRUBIN TOTAL: 0.6 mg/dL (ref 0.0–1.2)
BUN / CREAT RATIO: 20 (ref 12–28)
BUN: 20 mg/dL (ref 8–27)
CO2: 25 mmol/L (ref 20–29)
CREATININE: 0.99 mg/dL (ref 0.57–1.00)
Calcium: 9.6 mg/dL (ref 8.7–10.3)
Chloride: 101 mmol/L (ref 96–106)
GFR calc Af Amer: 62 mL/min/{1.73_m2} (ref >59)
GFR calc non Af Amer: 54 mL/min/{1.73_m2} — ABNORMAL LOW (ref >59)
GLUCOSE: 92 mg/dL (ref 65–99)
Globulin, Total: 2.6 g/dL (ref 1.5–4.5)
Potassium: 3.8 mmol/L (ref 3.5–5.2)
SODIUM: 142 mmol/L (ref 134–144)
Total Protein: 7 g/dL (ref 6.0–8.5)

## 2017-09-11 LAB — LIPID PANEL
CHOLESTEROL TOTAL: 205 mg/dL — AB (ref 100–199)
Chol/HDL Ratio: 3.3 ratio (ref 0.0–4.4)
HDL: 63 mg/dL (ref >39)
LDL CALC: 107 mg/dL — AB (ref 0–99)
TRIGLYCERIDES: 177 mg/dL — AB (ref 0–149)
VLDL CHOLESTEROL CAL: 35 mg/dL (ref 5–40)

## 2017-09-11 LAB — MICROALBUMIN / CREATININE URINE RATIO
Creatinine, Urine: 198.1 mg/dL
MICROALB/CREAT RATIO: 29.7 mg/g{creat} (ref 0.0–30.0)
MICROALBUM., U, RANDOM: 58.9 ug/mL

## 2017-10-21 ENCOUNTER — Other Ambulatory Visit: Payer: Self-pay | Admitting: Physician Assistant

## 2017-10-21 DIAGNOSIS — I10 Essential (primary) hypertension: Secondary | ICD-10-CM

## 2017-11-14 ENCOUNTER — Inpatient Hospital Stay (HOSPITAL_BASED_OUTPATIENT_CLINIC_OR_DEPARTMENT_OTHER)
Admission: EM | Admit: 2017-11-14 | Discharge: 2017-11-17 | DRG: 872 | Disposition: A | Discharge status: Home Health | Payer: Medicare Other | Attending: Internal Medicine | Admitting: Internal Medicine

## 2017-11-14 ENCOUNTER — Emergency Department (HOSPITAL_BASED_OUTPATIENT_CLINIC_OR_DEPARTMENT_OTHER): Payer: Medicare Other

## 2017-11-14 ENCOUNTER — Encounter (HOSPITAL_BASED_OUTPATIENT_CLINIC_OR_DEPARTMENT_OTHER): Payer: Self-pay

## 2017-11-14 ENCOUNTER — Emergency Department (HOSPITAL_BASED_OUTPATIENT_CLINIC_OR_DEPARTMENT_OTHER)
Admission: EM | Admit: 2017-11-14 | Discharge: 2017-11-14 | Disposition: A | Discharge status: Other Acute Inpatient Hospital | Payer: Medicare Other

## 2017-11-14 DIAGNOSIS — N179 Acute kidney failure, unspecified: Secondary | ICD-10-CM | POA: Diagnosis not present

## 2017-11-14 DIAGNOSIS — A4151 Sepsis due to Escherichia coli [E. coli]: Secondary | ICD-10-CM | POA: Diagnosis present

## 2017-11-14 DIAGNOSIS — R319 Hematuria, unspecified: Secondary | ICD-10-CM | POA: Diagnosis present

## 2017-11-14 DIAGNOSIS — R509 Fever, unspecified: Secondary | ICD-10-CM | POA: Diagnosis not present

## 2017-11-14 DIAGNOSIS — N390 Urinary tract infection, site not specified: Secondary | ICD-10-CM | POA: Diagnosis present

## 2017-11-14 DIAGNOSIS — R410 Disorientation, unspecified: Secondary | ICD-10-CM | POA: Diagnosis not present

## 2017-11-14 DIAGNOSIS — E119 Type 2 diabetes mellitus without complications: Secondary | ICD-10-CM | POA: Diagnosis present

## 2017-11-14 DIAGNOSIS — F03918 Unspecified dementia, unspecified severity, with other behavioral disturbance: Secondary | ICD-10-CM

## 2017-11-14 DIAGNOSIS — I10 Essential (primary) hypertension: Secondary | ICD-10-CM | POA: Diagnosis present

## 2017-11-14 DIAGNOSIS — D696 Thrombocytopenia, unspecified: Secondary | ICD-10-CM | POA: Diagnosis present

## 2017-11-14 DIAGNOSIS — E876 Hypokalemia: Secondary | ICD-10-CM | POA: Diagnosis present

## 2017-11-14 DIAGNOSIS — G9340 Encephalopathy, unspecified: Secondary | ICD-10-CM | POA: Diagnosis not present

## 2017-11-14 DIAGNOSIS — R4182 Altered mental status, unspecified: Secondary | ICD-10-CM | POA: Diagnosis not present

## 2017-11-14 DIAGNOSIS — R413 Other amnesia: Secondary | ICD-10-CM | POA: Diagnosis not present

## 2017-11-14 DIAGNOSIS — A419 Sepsis, unspecified organism: Secondary | ICD-10-CM

## 2017-11-14 DIAGNOSIS — F0391 Unspecified dementia with behavioral disturbance: Secondary | ICD-10-CM

## 2017-11-14 DIAGNOSIS — K219 Gastro-esophageal reflux disease without esophagitis: Secondary | ICD-10-CM | POA: Diagnosis not present

## 2017-11-14 DIAGNOSIS — E785 Hyperlipidemia, unspecified: Secondary | ICD-10-CM | POA: Diagnosis present

## 2017-11-14 DIAGNOSIS — Z90710 Acquired absence of both cervix and uterus: Secondary | ICD-10-CM | POA: Diagnosis not present

## 2017-11-14 DIAGNOSIS — F0390 Unspecified dementia without behavioral disturbance: Secondary | ICD-10-CM | POA: Diagnosis present

## 2017-11-14 DIAGNOSIS — G9349 Other encephalopathy: Secondary | ICD-10-CM | POA: Diagnosis present

## 2017-11-14 DIAGNOSIS — B9620 Unspecified Escherichia coli [E. coli] as the cause of diseases classified elsewhere: Secondary | ICD-10-CM | POA: Diagnosis not present

## 2017-11-14 DIAGNOSIS — R7881 Bacteremia: Secondary | ICD-10-CM | POA: Diagnosis not present

## 2017-11-14 LAB — RAPID URINE DRUG SCREEN, HOSP PERFORMED
AMPHETAMINES: NOT DETECTED
BENZODIAZEPINES: NOT DETECTED
Barbiturates: NOT DETECTED
COCAINE: NOT DETECTED
Opiates: NOT DETECTED
Tetrahydrocannabinol: NOT DETECTED

## 2017-11-14 LAB — CBC WITH DIFFERENTIAL/PLATELET
BASOS PCT: 0 %
Basophils Absolute: 0 10*3/uL (ref 0.0–0.1)
Eosinophils Absolute: 0 10*3/uL (ref 0.0–0.7)
Eosinophils Relative: 0 %
HEMATOCRIT: 39.4 % (ref 36.0–46.0)
Hemoglobin: 13.3 g/dL (ref 12.0–15.0)
LYMPHS ABS: 0.4 10*3/uL — AB (ref 0.7–4.0)
Lymphocytes Relative: 3 %
MCH: 31.1 pg (ref 26.0–34.0)
MCHC: 33.8 g/dL (ref 30.0–36.0)
MCV: 92.3 fL (ref 78.0–100.0)
MONOS PCT: 6 %
Monocytes Absolute: 0.9 10*3/uL (ref 0.1–1.0)
NEUTROS ABS: 13.4 10*3/uL — AB (ref 1.7–7.7)
NEUTROS PCT: 91 %
PLATELETS: 143 10*3/uL — AB (ref 150–400)
RBC: 4.27 MIL/uL (ref 3.87–5.11)
RDW: 13.3 % (ref 11.5–15.5)
WBC: 14.7 10*3/uL — ABNORMAL HIGH (ref 4.0–10.5)

## 2017-11-14 LAB — I-STAT CG4 LACTIC ACID, ED
LACTIC ACID, VENOUS: 2.1 mmol/L — AB (ref 0.5–1.9)
LACTIC ACID, VENOUS: 1.22 mmol/L (ref 0.5–1.9)

## 2017-11-14 LAB — COMPREHENSIVE METABOLIC PANEL
ALBUMIN: 3.6 g/dL (ref 3.5–5.0)
ALT: 23 U/L (ref 14–54)
AST: 31 U/L (ref 15–41)
Alkaline Phosphatase: 97 U/L (ref 38–126)
Anion gap: 10 (ref 5–15)
BUN: 26 mg/dL — ABNORMAL HIGH (ref 6–20)
CHLORIDE: 96 mmol/L — AB (ref 101–111)
CO2: 26 mmol/L (ref 22–32)
Calcium: 8.6 mg/dL — ABNORMAL LOW (ref 8.9–10.3)
Creatinine, Ser: 1.52 mg/dL — ABNORMAL HIGH (ref 0.44–1.00)
GFR calc Af Amer: 36 mL/min — ABNORMAL LOW (ref >60)
GFR calc non Af Amer: 31 mL/min — ABNORMAL LOW (ref >60)
GLUCOSE: 183 mg/dL — AB (ref 65–99)
POTASSIUM: 3 mmol/L — AB (ref 3.5–5.1)
SODIUM: 132 mmol/L — AB (ref 135–145)
Total Bilirubin: 1.7 mg/dL — ABNORMAL HIGH (ref 0.3–1.2)
Total Protein: 7.7 g/dL (ref 6.5–8.1)

## 2017-11-14 LAB — URINALYSIS, ROUTINE W REFLEX MICROSCOPIC
Glucose, UA: NEGATIVE mg/dL
Ketones, ur: 15 mg/dL — AB
NITRITE: NEGATIVE
Protein, ur: >300 mg/dL — AB
SPECIFIC GRAVITY, URINE: 1.025 (ref 1.005–1.030)
pH: 6 (ref 5.0–8.0)

## 2017-11-14 LAB — URINALYSIS, MICROSCOPIC (REFLEX)

## 2017-11-14 LAB — TROPONIN I: Troponin I: 0.03 ng/mL (ref <0.03)

## 2017-11-14 LAB — ETHANOL: Alcohol, Ethyl (B): <10 mg/dL (ref <10)

## 2017-11-14 LAB — LIPASE, BLOOD: Lipase: 16 U/L (ref 11–51)

## 2017-11-14 MED ORDER — POTASSIUM CHLORIDE CRYS ER 20 MEQ PO TBCR
20.0000 meq | EXTENDED_RELEASE_TABLET | Freq: Once | ORAL | Status: AC
  Administered 2017-11-15: 20 meq via ORAL
  Filled 2017-11-14: qty 1

## 2017-11-14 MED ORDER — SODIUM CHLORIDE 0.9 % IV BOLUS (SEPSIS)
1000.0000 mL | Freq: Once | INTRAVENOUS | Status: AC
  Administered 2017-11-14: 1000 mL via INTRAVENOUS

## 2017-11-14 MED ORDER — CEFTRIAXONE SODIUM 1 G IJ SOLR
1.0000 g | Freq: Once | INTRAMUSCULAR | Status: AC
  Administered 2017-11-14: 1 g via INTRAVENOUS
  Filled 2017-11-14: qty 10

## 2017-11-14 MED ORDER — ACETAMINOPHEN 325 MG PO TABS
650.0000 mg | ORAL_TABLET | Freq: Once | ORAL | Status: AC
  Administered 2017-11-14: 650 mg via ORAL
  Filled 2017-11-14: qty 2

## 2017-11-14 NOTE — ED Notes (Signed)
Per EMS pt lives at home with husband; son states pt fell once today, acting sluggish; denies pain or injury

## 2017-11-14 NOTE — ED Triage Notes (Signed)
Per ems pt lives with husband and son states he came over and she is not acting herself, sluggish and tripped over "water bag" and fell, denies injury or pain from the fall.

## 2017-11-14 NOTE — ED Provider Notes (Signed)
[place] EMERGENCY DEPARTMENT Provider Note   CSN: [id number] Arrival date & time: 11/14/17  1943     History   Chief Complaint Chief Complaint  Patient presents with  . Altered Mental Status    HPI [place] is a 82 y.o. female.  HPI 82 yo female with PMH of HTN, HLD, DM2 who presents with altered mental status and a mechanical fall. Per her son, her last normal mental state was yesterday (12/13) early evening. She lives with her husband. This morning her husband reported she didn't seem herself.  Son came to visit later on this evening and found her to be more confused and slow in mentation and not steady on her feet.  She was ambulating and she tripped on a "water bag"and fell but her husband caught her fall.  She did not hit her head and she did not lose consciousness.  They decided to bring her in by ambulance due to these events.  The patient denies any headaches, blurred vision, chest pain, shortness of breath, abdominal pain, nausea, vomiting, diarrhea, dysuria, urinary frequency.  She does not have any concerns. At baseline patient is fully independent in all ADLs and IADLs.   Past Medical History:  Diagnosis Date  . Diabetes mellitus without complication ([place])   . Diverticulitis   . DM (diabetes mellitus) ([place]) 09/24/2015  . HTN (hypertension) 09/24/2015  . Hyperlipidemia 09/24/2015  . Hypertension     Patient Active Problem List   Diagnosis Date Noted  . Sepsis ([place]) 11/14/2017  . Spinal stenosis of lumbar region 09/03/2017  . GERD (gastroesophageal reflux disease) 09/03/2017  . Depression 05/09/2017  . 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 Overactive bladder 11/19/2016  . 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 Seasonal allergies 03/14/2016  . Need for vaccination for H flu type B with pedvaxHIB 10/03/2015  . Neck pain   . Type 2 diabetes, HbA1c goal < 7% (HCC)   . Elevated troponin 09/24/2015  . Neck pain on right side 09/24/2015  .  Pain of right upper extremity 09/24/2015  . HTN (hypertension) 09/24/2015  . Diabetes mellitus without complication ([place]) 09/28/2535  . Hyperlipidemia 09/24/2015  . Leukocytosis 09/24/2015  . Colitis:/ DIAGNOSED 09/18/2015 09/24/2015    Past Surgical History:  Procedure Laterality Date  . ABDOMINAL HYSTERECTOMY    . CHOLECYSTECTOMY    . COLON SURGERY     COLECTOMY FOR DIVERTICULITIS  . HERNIA REPAIR      OB History    No data available       Home Medications    Prior to Admission medications   Medication Sig Start Date End Date Taking? Authorizing Provider  acetaminophen (TYLENOL) 500 MG tablet Take 1,000 mg by mouth every 8 (eight) hours as needed for moderate pain or headache.    [provider]  atorvastatin (LIPITOR) 40 MG tablet Take 1 tablet (40 mg total) by mouth daily. 09/03/17   Arnoldo Morale, MD  carvedilol (COREG) 25 MG tablet TAKE 1 TABLET BY MOUTH TWICE DAILY WITH MEAL 09/03/17   Arnoldo Morale, MD  hydrALAZINE (APRESOLINE) 25 MG tablet Take 1 tablet (25 mg total) by mouth 2 (two) times daily. 09/03/17   Arnoldo Morale, MD  losartan (COZAAR) 100 MG tablet Take 1 tablet (100 mg total) by mouth daily. 09/03/17   Arnoldo Morale, MD  olopatadine (PATANOL) 0.1 % ophthalmic solution Place 1 drop into both eyes 2 (two) times daily. 05/09/17   Arnoldo Morale, MD  omeprazole (PRILOSEC)  20 MG capsule Take 1 capsule (20 mg total) by mouth 2 (two) times daily before a meal. 09/03/17   Arnoldo Morale, MD  traMADol (ULTRAM) 50 MG tablet Take 1 tablet (50 mg total) by mouth every 12 (twelve) hours as needed. 09/03/17   Arnoldo Morale, MD    Family History No family history on file.  Social History Social History   Tobacco Use  . Smoking status: Never Smoker  . Smokeless tobacco: Never Used  Substance Use Topics  . Alcohol use: No  . Drug use: No     Allergies   Penicillins   Review of Systems Review of Systems Constitutional: Positive for activity change. Negative  for chills and fever.  HENT: Negative for rhinorrhea and sore throat.   Respiratory: Negative for cough and shortness of breath.   Cardiovascular: Negative for chest pain.  Gastrointestinal: Negative for abdominal pain, diarrhea, nausea and vomiting.  Endocrine: Negative for polydipsia and polyuria.  Psychiatric/Behavioral: Positive for confusion and decreased concentration.   Physical Exam Updated Vital Signs BP (!) 110/55 (BP Location: Left Arm)   Pulse 88   Temp (!) 101.5 F (38.6 C) (Rectal)   Resp (!) 24   SpO2 98%   Physical Exam  Constitutional: She appears well-developed and well-nourished. No distress.  HENT:  Mouth/Throat: Oropharynx is clear and moist.  Eyes: Conjunctivae and EOM are normal. Pupils are equal, round, and reactive to light.  Neck: Normal range of motion.  Cardiovascular: Normal rate and regular rhythm. Exam reveals no gallop and no friction rub.  No murmur heard. Pulmonary/Chest: Effort normal and breath sounds normal. No stridor. No respiratory distress. She has no wheezes. She has no rales.  Abdominal: Soft. Bowel sounds are normal. She exhibits no distension and no mass. There is no tenderness.  Musculoskeletal: She exhibits no edema.  Lymphadenopathy:    She has no cervical adenopathy.  Neurological: She is alert. No cranial nerve deficit.  Oriented to self only. Slow to answering questions. Is pleasant and speaks with her sons. Normal strength in all extremities bilaterally. Normal sensation to light touch in all extremities bilaterally.   Skin: Skin is warm and dry. Capillary refill takes less than 2 seconds. She is not diaphoretic.  Psychiatric: She has a normal mood and affect.     ED Treatments / Results  Labs (all labs ordered are listed, but only abnormal results are displayed) Labs Reviewed  COMPREHENSIVE METABOLIC PANEL - Abnormal; Notable for the following components:      Result Value   Sodium 132 (*)    Potassium 3.0 (*)     Chloride 96 (*)    Glucose, Bld 183 (*)    BUN 26 (*)    Creatinine, Ser 1.52 (*)    Calcium 8.6 (*)    Total Bilirubin 1.7 (*)    GFR calc non Af Amer 31 (*)    GFR calc Af Amer 36 (*)    All other components within normal limits  CBC WITH DIFFERENTIAL/PLATELET - Abnormal; Notable for the following components:   WBC 14.7 (*)    Platelets 143 (*)    Neutro Abs 13.4 (*)    Lymphs Abs 0.4 (*)    All other components within normal limits  TROPONIN I - Abnormal; Notable for the following components:   Troponin I 0.03 (*)    All other components within normal limits  URINALYSIS, ROUTINE W REFLEX MICROSCOPIC - Abnormal; Notable for the following components:   APPearance CLOUDY (*)  Hgb urine dipstick LARGE (*)    Bilirubin Urine SMALL (*)    Ketones, ur 15 (*)    Protein, ur >300 (*)    Leukocytes, UA TRACE (*)    All other components within normal limits  URINALYSIS, MICROSCOPIC (REFLEX) - Abnormal; Notable for the following components:   Bacteria, UA MANY (*)    Squamous Epithelial / LPF 0-5 (*)    All other components within normal limits  I-STAT CG4 LACTIC ACID, ED - Abnormal; Notable for the following components:   Lactic Acid, Venous 2.10 (*)    All other components within normal limits  URINE CULTURE  CULTURE, BLOOD (ROUTINE X 2)  CULTURE, BLOOD (ROUTINE X 2)  ETHANOL  LIPASE, BLOOD  RAPID URINE DRUG SCREEN, HOSP PERFORMED  LACTIC ACID, PLASMA  LACTIC ACID, PLASMA    EKG  EKG Interpretation None       Radiology Dg Chest 2 View  Result Date: 11/14/2017 CLINICAL DATA:  Fever.  Urinary tract infection and confusion. EXAM: CHEST  2 VIEW COMPARISON:  None. FINDINGS: Mild cardiac enlargement. Aortic atherosclerosis. No pleural effusion or edema. No airspace opacities identified. IMPRESSION: 1. No acute findings. 2.  Aortic Atherosclerosis (ICD10-I70.0). Electronically Signed   By: Kerby Moors M.D.   On: 11/14/2017 21:45   Ct Head Wo Contrast  Result Date:  11/14/2017 CLINICAL DATA:  Fever, UTI, confusion. EXAM: CT HEAD WITHOUT CONTRAST TECHNIQUE: Contiguous axial images were obtained from the base of the skull through the vertex without intravenous contrast. COMPARISON:  None. FINDINGS: Brain: There is no evidence of acute infarct, intracranial hemorrhage, mass, midline shift, or extra-axial fluid collection. Mild generalized cerebral atrophy is within normal limits for age. Patchy cerebral white matter hypodensities are nonspecific but compatible with mild moderate chronic small vessel ischemic disease. Vascular: Calcified atherosclerosis at the skullbase. No hyperdense vessel. Skull: No fracture or focal osseous lesion. Sinuses/Orbits: Visualized paranasal sinuses and mastoid air cells are clear. Bilateral cataract extraction. Other: None. IMPRESSION: 1. No evidence of acute intracranial abnormality. 2. Mild-to-moderate chronic small vessel ischemic disease. Electronically Signed   By: Logan Bores M.D.   On: 11/14/2017 21:21    Procedures Procedures (including critical care time)  Medications Ordered in ED Medications  potassium chloride SA (K-DUR,KLOR-CON) CR tablet 20 mEq (not administered)  sodium chloride 0.9 % bolus 1,000 mL (0 mLs Intravenous Stopped 11/14/17 2223)  acetaminophen (TYLENOL) tablet 650 mg (650 mg Oral Given 11/14/17 2050)  cefTRIAXone (ROCEPHIN) 1 g in dextrose 5 % 50 mL IVPB (0 g Intravenous Stopped 11/14/17 2155)     Initial Impression / Assessment and Plan / ED Course  I have reviewed the triage vital signs and the nursing notes.  Pertinent labs & imaging results that were available during my care of the patient were reviewed by me and considered in my medical decision making (see chart for details).  82 year old female with past medical history of hypertension, diabetes, hyperlipidemia, GERD, depression who presents with altered mental status.  Found to have fever of 103 F rectally.  She is mildly tachycardic to 106,  blood pressure is normal, without any respiratory distress.  With her altered mental status and fever code sepsis was initiated.  She received 1 L bolus.  UA shows trace leukocytes, large hemoglobin, 15 ketones, greater than 300 protein.  UTI is a likely source of her sepsis.  She was started on Rocephin.  Additionally her CBC showed leukocytosis to 14.7.  She also has a AKI with creatinine  of 1.5 to baseline around 0.9.  She also has hyponatremia to 132 and hypokalemia to 3.0.  Potassium repleted with K-Dur 20 mEq (lower dose in the setting of AKI). Initial lactic acid elevated to 2.1. Repeat is pending. Troponin I mildly elevated to 0.03. Patient denies chest pain. EKG unremarkable. Blood culture and urine culture obtained prior to antibiotic. UDS and ethanol level obtained prior to knowing her fever were negative.   Discussed with Dr. Hal Hope for admission.   Final Clinical Impressions(s) / ED Diagnoses   Final diagnoses:  Altered mental status, unspecified altered mental status type  Sepsis, due to unspecified organism [facility])  Urinary tract infection with hematuria, site unspecified  AKI (acute kidney injury) [facility])    ED Discharge Orders    None       Smiley Houseman, MD 11/14/17 2311    Drenda Freeze, MD 11/18/17 1700

## 2017-11-14 NOTE — ED Notes (Signed)
Cath done by Diane (RN)

## 2017-11-15 ENCOUNTER — Other Ambulatory Visit: Payer: Self-pay

## 2017-11-15 ENCOUNTER — Encounter (HOSPITAL_COMMUNITY): Payer: Self-pay

## 2017-11-15 DIAGNOSIS — I10 Essential (primary) hypertension: Secondary | ICD-10-CM

## 2017-11-15 DIAGNOSIS — G9340 Encephalopathy, unspecified: Secondary | ICD-10-CM | POA: Diagnosis present

## 2017-11-15 DIAGNOSIS — B9620 Unspecified Escherichia coli [E. coli] as the cause of diseases classified elsewhere: Secondary | ICD-10-CM | POA: Diagnosis present

## 2017-11-15 DIAGNOSIS — N390 Urinary tract infection, site not specified: Secondary | ICD-10-CM

## 2017-11-15 DIAGNOSIS — R319 Hematuria, unspecified: Secondary | ICD-10-CM

## 2017-11-15 DIAGNOSIS — A419 Sepsis, unspecified organism: Secondary | ICD-10-CM

## 2017-11-15 DIAGNOSIS — N179 Acute kidney failure, unspecified: Secondary | ICD-10-CM

## 2017-11-15 DIAGNOSIS — R4182 Altered mental status, unspecified: Secondary | ICD-10-CM

## 2017-11-15 LAB — BLOOD CULTURE ID PANEL (REFLEXED)
Acinetobacter baumannii: NOT DETECTED
CANDIDA KRUSEI: NOT DETECTED
CARBAPENEM RESISTANCE: NOT DETECTED
Candida albicans: NOT DETECTED
Candida glabrata: NOT DETECTED
Candida parapsilosis: NOT DETECTED
Candida tropicalis: NOT DETECTED
ENTEROBACTERIACEAE SPECIES: DETECTED — AB
ENTEROCOCCUS SPECIES: NOT DETECTED
Enterobacter cloacae complex: NOT DETECTED
Escherichia coli: DETECTED — AB
HAEMOPHILUS INFLUENZAE: NOT DETECTED
KLEBSIELLA OXYTOCA: NOT DETECTED
Klebsiella pneumoniae: NOT DETECTED
LISTERIA MONOCYTOGENES: NOT DETECTED
NEISSERIA MENINGITIDIS: NOT DETECTED
Proteus species: NOT DETECTED
Pseudomonas aeruginosa: NOT DETECTED
STAPHYLOCOCCUS AUREUS BCID: NOT DETECTED
STREPTOCOCCUS AGALACTIAE: NOT DETECTED
STREPTOCOCCUS PNEUMONIAE: NOT DETECTED
STREPTOCOCCUS SPECIES: NOT DETECTED
Serratia marcescens: NOT DETECTED
Staphylococcus species: NOT DETECTED
Streptococcus pyogenes: NOT DETECTED

## 2017-11-15 LAB — CBC WITH DIFFERENTIAL/PLATELET
BASOS PCT: 0 %
Basophils Absolute: 0 10*3/uL (ref 0.0–0.1)
EOS ABS: 0 10*3/uL (ref 0.0–0.7)
EOS PCT: 0 %
HCT: 35.1 % — ABNORMAL LOW (ref 36.0–46.0)
HEMOGLOBIN: 11.9 g/dL — AB (ref 12.0–15.0)
Lymphocytes Relative: 4 %
Lymphs Abs: 0.5 10*3/uL — ABNORMAL LOW (ref 0.7–4.0)
MCH: 31.1 pg (ref 26.0–34.0)
MCHC: 33.9 g/dL (ref 30.0–36.0)
MCV: 91.6 fL (ref 78.0–100.0)
MONOS PCT: 9 %
Monocytes Absolute: 1.3 10*3/uL — ABNORMAL HIGH (ref 0.1–1.0)
NEUTROS PCT: 87 %
Neutro Abs: 13.3 10*3/uL — ABNORMAL HIGH (ref 1.7–7.7)
PLATELETS: 124 10*3/uL — AB (ref 150–400)
RBC: 3.83 MIL/uL — AB (ref 3.87–5.11)
RDW: 13.4 % (ref 11.5–15.5)
WBC: 15.2 10*3/uL — AB (ref 4.0–10.5)

## 2017-11-15 LAB — TROPONIN I
TROPONIN I: 0.04 ng/mL — AB (ref <0.03)
TROPONIN I: 0.03 ng/mL — AB (ref <0.03)
Troponin I: 0.03 ng/mL (ref <0.03)

## 2017-11-15 LAB — GLUCOSE, CAPILLARY
GLUCOSE-CAPILLARY: 146 mg/dL — AB (ref 65–99)
GLUCOSE-CAPILLARY: 114 mg/dL — AB (ref 65–99)
Glucose-Capillary: 154 mg/dL — ABNORMAL HIGH (ref 65–99)

## 2017-11-15 LAB — COMPREHENSIVE METABOLIC PANEL
ALK PHOS: 101 U/L (ref 38–126)
ALT: 23 U/L (ref 14–54)
AST: 32 U/L (ref 15–41)
Albumin: 3 g/dL — ABNORMAL LOW (ref 3.5–5.0)
Anion gap: 10 (ref 5–15)
BUN: 24 mg/dL — AB (ref 6–20)
CALCIUM: 8.1 mg/dL — AB (ref 8.9–10.3)
CO2: 26 mmol/L (ref 22–32)
CREATININE: 1.51 mg/dL — AB (ref 0.44–1.00)
Chloride: 100 mmol/L — ABNORMAL LOW (ref 101–111)
GFR, EST AFRICAN AMERICAN: 36 mL/min — AB (ref >60)
GFR, EST NON AFRICAN AMERICAN: 31 mL/min — AB (ref >60)
Glucose, Bld: 145 mg/dL — ABNORMAL HIGH (ref 65–99)
Potassium: 3.3 mmol/L — ABNORMAL LOW (ref 3.5–5.1)
Sodium: 136 mmol/L (ref 135–145)
Total Bilirubin: 2.1 mg/dL — ABNORMAL HIGH (ref 0.3–1.2)
Total Protein: 6.5 g/dL (ref 6.5–8.1)

## 2017-11-15 LAB — PROCALCITONIN: PROCALCITONIN: 12.42 ng/mL

## 2017-11-15 LAB — SODIUM, URINE, RANDOM: SODIUM UR: 35 mmol/L

## 2017-11-15 LAB — MAGNESIUM: Magnesium: 1.5 mg/dL — ABNORMAL LOW (ref 1.7–2.4)

## 2017-11-15 LAB — LACTIC ACID, PLASMA
LACTIC ACID, VENOUS: 1.3 mmol/L (ref 0.5–1.9)
Lactic Acid, Venous: 1.4 mmol/L (ref 0.5–1.9)

## 2017-11-15 MED ORDER — MAGNESIUM SULFATE 4 GM/100ML IV SOLN
4.0000 g | Freq: Once | INTRAVENOUS | Status: AC
  Administered 2017-11-15: 4 g via INTRAVENOUS
  Filled 2017-11-15: qty 100

## 2017-11-15 MED ORDER — ONDANSETRON HCL 4 MG/2ML IJ SOLN: 4.0000 mg | Freq: Four times a day (QID) | INTRAMUSCULAR | Status: DC | PRN

## 2017-11-15 MED ORDER — DEXTROSE 5 % IV SOLN: 2.0000 g | INTRAVENOUS | Status: DC

## 2017-11-15 MED ORDER — POTASSIUM CHLORIDE CRYS ER 20 MEQ PO TBCR
40.0000 meq | EXTENDED_RELEASE_TABLET | Freq: Once | ORAL | Status: AC
  Administered 2017-11-15: 40 meq via ORAL
  Filled 2017-11-15: qty 2

## 2017-11-15 MED ORDER — ACETAMINOPHEN 650 MG RE SUPP: 650.0000 mg | Freq: Four times a day (QID) | RECTAL | Status: DC | PRN

## 2017-11-15 MED ORDER — ACETAMINOPHEN 325 MG PO TABS: 650.0000 mg | ORAL_TABLET | Freq: Four times a day (QID) | ORAL | Status: DC | PRN

## 2017-11-15 MED ORDER — PANTOPRAZOLE SODIUM 40 MG PO TBEC
40.0000 mg | DELAYED_RELEASE_TABLET | Freq: Every day | ORAL | Status: DC
  Administered 2017-11-15 – 2017-11-17 (×3): 40 mg via ORAL
  Filled 2017-11-15 (×3): qty 1

## 2017-11-15 MED ORDER — INSULIN ASPART 100 UNIT/ML ~~LOC~~ SOLN
0.0000 [IU] | Freq: Three times a day (TID) | SUBCUTANEOUS | Status: DC
  Administered 2017-11-15: 1 [IU] via SUBCUTANEOUS
  Administered 2017-11-15: 2 [IU] via SUBCUTANEOUS

## 2017-11-15 MED ORDER — ASPIRIN 325 MG PO TABS
325.0000 mg | ORAL_TABLET | Freq: Every day | ORAL | Status: DC
  Administered 2017-11-15 – 2017-11-17 (×3): 325 mg via ORAL
  Filled 2017-11-15 (×3): qty 1

## 2017-11-15 MED ORDER — DEXTROSE 5 % IV SOLN: 1.0000 g | INTRAVENOUS | Status: DC

## 2017-11-15 MED ORDER — ONDANSETRON HCL 4 MG PO TABS: 4.0000 mg | ORAL_TABLET | Freq: Four times a day (QID) | ORAL | Status: DC | PRN

## 2017-11-15 MED ORDER — HYDRALAZINE HCL 25 MG PO TABS
25.0000 mg | ORAL_TABLET | Freq: Two times a day (BID) | ORAL | Status: DC
  Administered 2017-11-15 – 2017-11-16 (×3): 25 mg via ORAL
  Filled 2017-11-15 (×3): qty 1

## 2017-11-15 MED ORDER — CARVEDILOL 25 MG PO TABS
25.0000 mg | ORAL_TABLET | Freq: Two times a day (BID) | ORAL | Status: DC
  Administered 2017-11-15 – 2017-11-16 (×4): 25 mg via ORAL
  Filled 2017-11-15 (×3): qty 1

## 2017-11-15 MED ORDER — ATORVASTATIN CALCIUM 40 MG PO TABS
40.0000 mg | ORAL_TABLET | Freq: Every day | ORAL | Status: DC
  Administered 2017-11-15 – 2017-11-17 (×3): 40 mg via ORAL
  Filled 2017-11-15 (×3): qty 1

## 2017-11-15 MED ORDER — ENOXAPARIN SODIUM 30 MG/0.3ML ~~LOC~~ SOLN
30.0000 mg | SUBCUTANEOUS | Status: DC
  Administered 2017-11-15 – 2017-11-16 (×2): 30 mg via SUBCUTANEOUS
  Filled 2017-11-15 (×2): qty 0.3

## 2017-11-15 MED ORDER — DEXTROSE 5 % IV SOLN
2.0000 g | INTRAVENOUS | Status: DC
  Administered 2017-11-15 – 2017-11-17 (×3): 2 g via INTRAVENOUS
  Filled 2017-11-15 (×3): qty 2

## 2017-11-15 MED ORDER — SODIUM CHLORIDE 0.9 % IV SOLN
INTRAVENOUS | Status: DC
  Administered 2017-11-15: 03:00:00 via INTRAVENOUS

## 2017-11-15 NOTE — H&P (Signed)
History and Physical    Caroline Cooper SHF:[id number] DOB: 1935/01/07 DOA: 11/14/2017  PCP: Arnoldo Morale, MD  Patient coming from: Home.  Chief Complaint: Confusion.  HPI: Caroline Cooper is a 82 y.o. female with history of hypertension, hyperlipidemia and diabetes mellitus was brought to the ER after patient was found to be confused by patient's son.  Patient was last seen normal by his patient's son 2 days ago and was found to be increasingly confused since then and also had a fall but did not hit her head and was held by her husband before she went on to the floor.  Since patient was persistently confused patient was brought to the ER at [facility].  Patient denies any nausea vomiting chest pain shortness of breath abdominal pain or diarrhea.  ED Course: In the ER patient had a fever of 103 F appeared confused CT head was unremarkable chest x-Caroline was unremarkable UA was consistent with UTI.  Lactate is elevated labs show leukocytosis and creatinine was elevated from baseline.  Patient was placed on sepsis protocol started on ceftriaxone and admitted for sepsis.  Review of Systems: As per HPI, rest all negative.   Past Medical History:  Diagnosis Date  . Diabetes mellitus without complication ([place])   . Diverticulitis   . DM (diabetes mellitus) ([place]) 09/24/2015  . HTN (hypertension) 09/24/2015  . Hyperlipidemia 09/24/2015  . Hypertension     Past Surgical History:  Procedure Laterality Date  . ABDOMINAL HYSTERECTOMY    . CHOLECYSTECTOMY    . COLON SURGERY     COLECTOMY FOR DIVERTICULITIS  . HERNIA REPAIR       reports that  has never smoked. she has never used smokeless tobacco. She reports that she does not drink alcohol or use drugs.  Allergies  Allergen Reactions  . Penicillins     Unknown reaction  Has patient had a PCN reaction causing immediate rash, facial/tongue/throat swelling, SOB or lightheadedness with hypotension: No Has  patient had a PCN reaction causing severe rash involving mucus membranes or skin necrosis: No Has patient had a PCN reaction that required hospitalization No Has patient had a PCN reaction occurring within the last 10 years: No If all of the above answers are "NO", then may proceed with Cephalosporin use.     Family History  Problem Relation Age of Onset  . Hypertension Other     Prior to Admission medications   Medication Sig Start Date End Date Taking? Authorizing Provider  acetaminophen (TYLENOL) 500 MG tablet Take 1,000 mg by mouth every 8 (eight) hours as needed for moderate pain or headache.   Yes [provider]  carvedilol (COREG) 25 MG tablet TAKE 1 TABLET BY MOUTH TWICE DAILY WITH MEAL 09/03/17  Yes Arnoldo Morale, MD  hydrALAZINE (APRESOLINE) 25 MG tablet Take 1 tablet (25 mg total) by mouth 2 (two) times daily. 09/03/17  Yes Arnoldo Morale, MD  losartan (COZAAR) 100 MG tablet Take 1 tablet (100 mg total) by mouth daily. 09/03/17  Yes Arnoldo Morale, MD  omeprazole (PRILOSEC) 20 MG capsule Take 1 capsule (20 mg total) by mouth 2 (two) times daily before a meal. 09/03/17  Yes Amao, Enobong, MD  atorvastatin (LIPITOR) 40 MG tablet Take 1 tablet (40 mg total) by mouth daily. Patient not taking: Reported on 11/15/2017 09/03/17   Arnoldo Morale, MD  olopatadine (PATANOL) 0.1 % ophthalmic solution Place 1 drop into both eyes 2 (two) times daily. Patient not  taking: Reported on 11/15/2017 05/09/17   Arnoldo Morale, MD  traMADol (ULTRAM) 50 MG tablet Take 1 tablet (50 mg total) by mouth every 12 (twelve) hours as needed. Patient not taking: Reported on 11/15/2017 09/03/17   Arnoldo Morale, MD    Physical Exam: Vitals:   11/14/17 2035 11/14/17 2237 11/14/17 2241 11/15/17 0009  BP:  (!) 110/55  137/75  Pulse:  88  (!) 103  Resp:  (!) 24  (!) 22  Temp: (!) 103.4 F (39.7 C)  (!) 101.5 F (38.6 C) 98.8 F (37.1 C)  TempSrc: Rectal  Rectal Oral  SpO2:  98%  95%  Weight:    68.2 kg  (150 lb 5.7 oz)  Height:    5\' 3"  (1.6 m)      Constitutional: Moderately built and nourished. Vitals:   11/14/17 2035 11/14/17 2237 11/14/17 2241 11/15/17 0009  BP:  (!) 110/55  137/75  Pulse:  88  (!) 103  Resp:  (!) 24  (!) 22  Temp: (!) 103.4 F (39.7 C)  (!) 101.5 F (38.6 C) 98.8 F (37.1 C)  TempSrc: Rectal  Rectal Oral  SpO2:  98%  95%  Weight:    68.2 kg (150 lb 5.7 oz)  Height:    5\' 3"  (1.6 m)   Eyes: Anicteric no pallor. ENMT: No discharge from the ears eyes nose or mouth. Neck: No mass felt.  No neck rigidity. Respiratory: No rhonchi or crepitations. Cardiovascular: S1-S2 heard no murmurs appreciated. Abdomen: Soft nontender bowel sounds present. Musculoskeletal: No edema.  No joint effusion. Skin: No rash.  Skin appears warm. Neurologic: Patient is alert awake oriented to name and place but appears mildly confused.  Moves all extremities 5 x 5. Psychiatric: Appears mildly confused.   Labs on Admission: I have personally reviewed following labs and imaging studies  CBC: Recent Labs  Lab 11/14/17 2030  WBC 14.7*  NEUTROABS 13.4*  HGB 13.3  HCT 39.4  MCV 92.3  PLT 161*   Basic Metabolic Panel: Recent Labs  Lab 11/14/17 2030  NA 132*  K 3.0*  CL 96*  CO2 26  GLUCOSE 183*  BUN 26*  CREATININE 1.52*  CALCIUM 8.6*   GFR: Estimated Creatinine Clearance: 27.4 mL/min (A) (by C-G formula based on SCr of 1.52 mg/dL (H)). Liver Function Tests: Recent Labs  Lab 11/14/17 2030  AST 31  ALT 23  ALKPHOS 97  BILITOT 1.7*  PROT 7.7  ALBUMIN 3.6   Recent Labs  Lab 11/14/17 2030  LIPASE 16   No results for input(s): AMMONIA in the last 168 hours. Coagulation Profile: No results for input(s): INR, PROTIME in the last 168 hours. Cardiac Enzymes: Recent Labs  Lab 11/14/17 2030  TROPONINI 0.03*   BNP (last 3 results) No results for input(s): PROBNP in the last 8760 hours. HbA1C: No results for input(s): HGBA1C in the last 72  hours. CBG: No results for input(s): GLUCAP in the last 168 hours. Lipid Profile: No results for input(s): CHOL, HDL, LDLCALC, TRIG, CHOLHDL, LDLDIRECT in the last 72 hours. Thyroid Function Tests: No results for input(s): TSH, T4TOTAL, FREET4, T3FREE, THYROIDAB in the last 72 hours. Anemia Panel: No results for input(s): VITAMINB12, FOLATE, FERRITIN, TIBC, IRON, RETICCTPCT in the last 72 hours. Urine analysis:    Component Value Date/Time   COLORURINE YELLOW 11/14/2017 2030   APPEARANCEUR CLOUDY (A) 11/14/2017 2030   LABSPEC 1.025 11/14/2017 2030   PHURINE 6.0 11/14/2017 2030   GLUCOSEU NEGATIVE 11/14/2017 2030  HGBUR LARGE (A) 11/14/2017 2030   BILIRUBINUR SMALL (A) 11/14/2017 2030   KETONESUR 15 (A) 11/14/2017 2030   PROTEINUR >300 (A) 11/14/2017 2030   UROBILINOGEN 0.2 09/24/2015 0833   NITRITE NEGATIVE 11/14/2017 2030   LEUKOCYTESUR TRACE (A) 11/14/2017 2030   Sepsis Labs: @LABRCNTIP (procalcitonin:4,lacticidven:4) )No results found for this or any previous visit (from the past 240 hour(s)).   Radiological Exams on Admission: Dg Chest 2 View  Result Date: 11/14/2017 CLINICAL DATA:  Fever.  Urinary tract infection and confusion. EXAM: CHEST  2 VIEW COMPARISON:  None. FINDINGS: Mild cardiac enlargement. Aortic atherosclerosis. No pleural effusion or edema. No airspace opacities identified. IMPRESSION: 1. No acute findings. 2.  Aortic Atherosclerosis (ICD10-I70.0). Electronically Signed   By: Kerby Moors M.D.   On: 11/14/2017 21:45   Ct Head Wo Contrast  Result Date: 11/14/2017 CLINICAL DATA:  Fever, UTI, confusion. EXAM: CT HEAD WITHOUT CONTRAST TECHNIQUE: Contiguous axial images were obtained from the base of the skull through the vertex without intravenous contrast. COMPARISON:  None. FINDINGS: Brain: There is no evidence of acute infarct, intracranial hemorrhage, mass, midline shift, or extra-axial fluid collection. Mild generalized cerebral atrophy is within normal  limits for age. Patchy cerebral white matter hypodensities are nonspecific but compatible with mild moderate chronic small vessel ischemic disease. Vascular: Calcified atherosclerosis at the skullbase. No hyperdense vessel. Skull: No fracture or focal osseous lesion. Sinuses/Orbits: Visualized paranasal sinuses and mastoid air cells are clear. Bilateral cataract extraction. Other: None. IMPRESSION: 1. No evidence of acute intracranial abnormality. 2. Mild-to-moderate chronic small vessel ischemic disease. Electronically Signed   By: Logan Bores M.D.   On: 11/14/2017 21:21    EKG: Independently reviewed.  Sinus tachycardia right bundle branch block.  Assessment/Plan Active Problems:   HTN (hypertension)   Type 2 diabetes, HbA1c goal < 7% (HCC)   Sepsis (HCC)   Urinary tract infection with hematuria   AKI (acute kidney injury) ([place])   Acute encephalopathy    1. Sepsis from UTI -she is placed on ceftriaxone.  Follow cultures.  Continue with IV hydration.  Follow lactate levels pro calcitonin levels.  Patient does not have any abdominal pain nausea vomiting to suggest any urinary tract obstruction. 2. Acute encephalopathy likely from sepsis -closely observe. 3. Acute renal failure probably from sepsis.  Hold Cozaar and continue hydration for now.  Follow metabolic panel closely along with intake output.  Does not have any nausea vomiting or flank pain to suggest obstruction.  Closely observe. 4. Elevated troponin -patient denies any chest pain.  We will cycle cardiac markers check 2D echo patient is on aspirin Coreg and statins. 5. Hypertension -continue Coreg would hold Cozaar due to acute renal failure.  I have placed patient on as needed IV hydralazine. 6. History of diabetes mellitus type 2 -I do not see any medications on patient's medication list.  For now I have placed patient on sliding scale coverage.  Need to discuss with patient's family in the morning to confirm home  medications. 7. Thrombocytopenia -has had thrombocytopenia previously.  We will closely follow CBC.  Need worsening will need further hemolytic workup.   DVT prophylaxis: Lovenox. Code Status: Full code. Family Communication: No family at the bedside. Disposition Plan: Home. Consults called: None. Admission status: Inpatient.   Rise Patience MD [facility] Pager [phone].  If 7PM-7AM, please contact night-coverage [email or web address] Password [email or web address]  11/15/2017, 1:59 AM

## 2017-11-15 NOTE — Progress Notes (Signed)
PHARMACY - PHYSICIAN COMMUNICATION CRITICAL VALUE ALERT - BLOOD CULTURE IDENTIFICATION (BCID)  Caroline Cooper is an 82 y.o. female who presented to [facility] on 11/14/2017 with a chief complaint of AMS.  Ceftriaxone 1gm IV q24h started on admission for UTI.  2/2 Bcx now showed GNR (BCID= ecoli)  Name of physician (or Provider) Contacted: Paged Dr. Grandville Silos twice but did not get a return page  Current antibiotics: ceftriaxone dose already changed by MD to 2gm IV q24h  Changes to prescribed antibiotics recommended:  Patient is on recommended antibiotics - No changes needed  Results for orders placed or performed during the hospital encounter of 11/14/17  Blood Culture ID Panel (Reflexed) (Collected: 11/14/2017  8:45 PM)  Result Value Ref Range   Enterococcus species NOT DETECTED NOT DETECTED   Listeria monocytogenes NOT DETECTED NOT DETECTED   Staphylococcus species NOT DETECTED NOT DETECTED   Staphylococcus aureus NOT DETECTED NOT DETECTED   Streptococcus species NOT DETECTED NOT DETECTED   Streptococcus agalactiae NOT DETECTED NOT DETECTED   Streptococcus pneumoniae NOT DETECTED NOT DETECTED   Streptococcus pyogenes NOT DETECTED NOT DETECTED   Acinetobacter baumannii NOT DETECTED NOT DETECTED   Enterobacteriaceae species DETECTED (A) NOT DETECTED   Enterobacter cloacae complex NOT DETECTED NOT DETECTED   Escherichia coli DETECTED (A) NOT DETECTED   Klebsiella oxytoca NOT DETECTED NOT DETECTED   Klebsiella pneumoniae NOT DETECTED NOT DETECTED   Proteus species NOT DETECTED NOT DETECTED   Serratia marcescens NOT DETECTED NOT DETECTED   Carbapenem resistance NOT DETECTED NOT DETECTED   Haemophilus influenzae NOT DETECTED NOT DETECTED   Neisseria meningitidis NOT DETECTED NOT DETECTED   Pseudomonas aeruginosa NOT DETECTED NOT DETECTED   Candida albicans NOT DETECTED NOT DETECTED   Candida glabrata NOT DETECTED NOT DETECTED   Candida krusei NOT DETECTED NOT DETECTED   Candida parapsilosis NOT DETECTED NOT DETECTED   Candida tropicalis NOT DETECTED NOT DETECTED    Hersey Maclellan P 11/15/2017  11:10 AM

## 2017-11-15 NOTE — Progress Notes (Signed)
Rx Brief note: Lovenox  Rx adjusted Lovenox to 30 mg daily in pt with CrCl<30 ml/min  Thanks Dorrene German 11/15/2017 2:07 AM

## 2017-11-15 NOTE — Progress Notes (Addendum)
Seen and assessed patient and agree with Dr. Moise Boring assessment and plan.  Patient is a pleasant 82 year old female, history of dementia, diabetes mellitus, hyperlipidemia, hypertension presented to the ED with acute encephalopathy.  Urine cultures were pending.  Chest x-ray negative for any acute infiltrate.  Patient with some clinical improvement however not at baseline.  Cardiac enzymes pending. Preliminary blood cultures c/w probable bacteremia.  Increase Rocephin to 2 g IV daily.  No charge.

## 2017-11-15 NOTE — Progress Notes (Signed)
Pharmacy Antibiotic Note  Caroline Cooper is a 82 y.o. female admitted on 11/14/2017 with UTI.  Pharmacy has been consulted for rocephin dosing.  Plan: Rocephin 1 Gm IV q24h for UTI Rx will sign off as no further adjustments are needed  Height: 5\' 3"  (160 cm) Weight: 150 lb 5.7 oz (68.2 kg) IBW/kg (Calculated) : 52.4  Temp (24hrs), Avg:100.7 F (38.2 C), Min:98.8 F (37.1 C), Max:103.4 F (39.7 C)  Recent Labs  Lab 11/14/17 2030 11/14/17 2056 11/14/17 2323  WBC 14.7*  --   --   CREATININE 1.52*  --   --   LATICACIDVEN  --  2.10* 1.22    Estimated Creatinine Clearance: 27.4 mL/min (A) (by C-G formula based on SCr of 1.52 mg/dL (H)).    Allergies  Allergen Reactions  . Penicillins     Unknown reaction  Has patient had a PCN reaction causing immediate rash, facial/tongue/throat swelling, SOB or lightheadedness with hypotension: No Has patient had a PCN reaction causing severe rash involving mucus membranes or skin necrosis: No Has patient had a PCN reaction that required hospitalization No Has patient had a PCN reaction occurring within the last 10 years: No If all of the above answers are "NO", then may proceed with Cephalosporin use.     Antimicrobials this admission: 12/14 rocephin >>    >>   Dose adjustments this admission:   Microbiology results:  BCx:   UCx:    Sputum:    MRSA PCR:   Thank you for allowing pharmacy to be a part of this patient's care.  Dorrene German 11/15/2017 2:10 AM

## 2017-11-15 NOTE — Progress Notes (Addendum)
CRITICAL VALUE ALERT  Critical Value: Troponin 0.04  Date & Time Notied: 12/15 0357  Provider Notified: Kennon Holter  Orders Received/Actions taken: pending

## 2017-11-16 DIAGNOSIS — E119 Type 2 diabetes mellitus without complications: Secondary | ICD-10-CM

## 2017-11-16 DIAGNOSIS — K219 Gastro-esophageal reflux disease without esophagitis: Secondary | ICD-10-CM

## 2017-11-16 DIAGNOSIS — A4151 Sepsis due to Escherichia coli [E. coli]: Principal | ICD-10-CM

## 2017-11-16 DIAGNOSIS — R7881 Bacteremia: Secondary | ICD-10-CM

## 2017-11-16 DIAGNOSIS — B9620 Unspecified Escherichia coli [E. coli] as the cause of diseases classified elsewhere: Secondary | ICD-10-CM

## 2017-11-16 DIAGNOSIS — N390 Urinary tract infection, site not specified: Secondary | ICD-10-CM

## 2017-11-16 DIAGNOSIS — R319 Hematuria, unspecified: Secondary | ICD-10-CM

## 2017-11-16 LAB — BASIC METABOLIC PANEL
ANION GAP: 8 (ref 5–15)
BUN: 28 mg/dL — ABNORMAL HIGH (ref 6–20)
CALCIUM: 7.9 mg/dL — AB (ref 8.9–10.3)
CO2: 24 mmol/L (ref 22–32)
CREATININE: 1.2 mg/dL — AB (ref 0.44–1.00)
Chloride: 104 mmol/L (ref 101–111)
GFR, EST AFRICAN AMERICAN: 48 mL/min — AB (ref >60)
GFR, EST NON AFRICAN AMERICAN: 42 mL/min — AB (ref >60)
Glucose, Bld: 118 mg/dL — ABNORMAL HIGH (ref 65–99)
Potassium: 3.4 mmol/L — ABNORMAL LOW (ref 3.5–5.1)
SODIUM: 136 mmol/L (ref 135–145)

## 2017-11-16 LAB — GLUCOSE, CAPILLARY
GLUCOSE-CAPILLARY: 113 mg/dL — AB (ref 65–99)
GLUCOSE-CAPILLARY: 104 mg/dL — AB (ref 65–99)
Glucose-Capillary: 124 mg/dL — ABNORMAL HIGH (ref 65–99)
Glucose-Capillary: 116 mg/dL — ABNORMAL HIGH (ref 65–99)

## 2017-11-16 LAB — CBC WITH DIFFERENTIAL/PLATELET
BASOS ABS: 0 10*3/uL (ref 0.0–0.1)
BASOS PCT: 0 %
EOS ABS: 0 10*3/uL (ref 0.0–0.7)
EOS PCT: 0 %
HCT: 30.6 % — ABNORMAL LOW (ref 36.0–46.0)
HEMOGLOBIN: 10.3 g/dL — AB (ref 12.0–15.0)
Lymphocytes Relative: 5 %
Lymphs Abs: 0.6 10*3/uL — ABNORMAL LOW (ref 0.7–4.0)
MCH: 30.7 pg (ref 26.0–34.0)
MCHC: 33.7 g/dL (ref 30.0–36.0)
MCV: 91.1 fL (ref 78.0–100.0)
Monocytes Absolute: 0.5 10*3/uL (ref 0.1–1.0)
Monocytes Relative: 4 %
NEUTROS PCT: 90 %
Neutro Abs: 10.9 10*3/uL — ABNORMAL HIGH (ref 1.7–7.7)
PLATELETS: 117 10*3/uL — AB (ref 150–400)
RBC: 3.36 MIL/uL — AB (ref 3.87–5.11)
RDW: 14 % (ref 11.5–15.5)
WBC: 12.1 10*3/uL — AB (ref 4.0–10.5)

## 2017-11-16 LAB — MAGNESIUM: Magnesium: 2.8 mg/dL — ABNORMAL HIGH (ref 1.7–2.4)

## 2017-11-16 MED ORDER — POTASSIUM CHLORIDE CRYS ER 20 MEQ PO TBCR
40.0000 meq | EXTENDED_RELEASE_TABLET | ORAL | Status: AC
  Administered 2017-11-16 (×2): 40 meq via ORAL
  Filled 2017-11-16 (×2): qty 2

## 2017-11-16 MED ORDER — SODIUM CHLORIDE 0.9 % IV SOLN
INTRAVENOUS | Status: DC
  Filled 2017-11-16: qty 1000

## 2017-11-16 MED ORDER — CARVEDILOL 12.5 MG PO TABS
12.5000 mg | ORAL_TABLET | Freq: Two times a day (BID) | ORAL | Status: DC
  Administered 2017-11-17: 12.5 mg via ORAL
  Filled 2017-11-16 (×2): qty 1

## 2017-11-16 MED ORDER — LORAZEPAM 2 MG/ML IJ SOLN
0.5000 mg | Freq: Once | INTRAMUSCULAR | Status: DC
  Filled 2017-11-16: qty 1

## 2017-11-16 MED ORDER — SODIUM CHLORIDE 0.9 % IV SOLN
INTRAVENOUS | Status: DC
  Administered 2017-11-16: 10:00:00 via INTRAVENOUS

## 2017-11-16 MED ORDER — ENOXAPARIN SODIUM 40 MG/0.4ML ~~LOC~~ SOLN
40.0000 mg | SUBCUTANEOUS | Status: DC
  Administered 2017-11-17: 40 mg via SUBCUTANEOUS
  Filled 2017-11-16: qty 0.4

## 2017-11-16 NOTE — Progress Notes (Addendum)
PROGRESS NOTE    Caroline Cooper [name]  KYH:[id number] DOB: [birth year] DOA: 11/14/2017 PCP: Arnoldo Morale, MD    Brief Narrative:  Patient is a pleasant 82 year old female history of hypertension, hyperlipidemia, diabetes brought to the ED for acute encephalopathy.  Workup done was worrisome for UTI and E. coli bacteremia.  Patient on empiric IV Rocephin and improving clinically.  Supportive care.   Assessment & Plan:   Principal Problem:   Sepsis ([place]) Active Problems:   Bacteremia due to Escherichia coli   E. coli UTI   HTN (hypertension)   Type 2 diabetes, HbA1c goal < 7% (HCC)   GERD (gastroesophageal reflux disease)   AKI (acute kidney injury) ([place])   Acute encephalopathy   Altered mental status  #1 sepsis secondary to E. coli bacteremia and E. coli UTI Patient had presented with altered mental status, urinalysis initially done on admission was concerning for possible UTI.  Patient pancultured.  Blood cultures now positive for E. coli bacteremia likely have seeded from the urine.  Urine cultures positive for E. coli.  Sensitivities of blood cultures and urine cultures pending.  Chest x-ray negative.  Patient currently afebrile.  Lactic acid level trended down.  WBC trending down.  Continue empiric IV Rocephin while awaiting sensitivities.  Saline lock IV fluids.  Supportive care.  2.  Well-controlled type 2 diabetes mellitus Hemoglobin A1c 5.9 on 09/03/2017.  CBGs have ranged from 104-114.  Continue sliding scale insulin.  3.  Borderline blood pressure Blood pressure borderline.  Will discontinue patient's hydralazine for now.  Decrease Coreg to half home dose.  Follow.  4.  Acute kidney injury Likely secondary to a prerenal azotemia.  Improving with hydration.  Follow.  5.  Hypokalemia Replete.  6.  Gastroesophageal reflux disease PPI.   DVT prophylaxis: Lovenox Code Status: Full Family Communication: Updated patient and family at bedside. Disposition Plan:  Likely home when medically stable, afebrile, clinical improvement hopefully in the next 2-3 days.   Consultants:   None  Procedures:   CT head without contrast 11/14/2017  Chest x-ray 11/14/2017    Antimicrobials:   IV Rocephin 11/15/2017   Subjective: Patient laying in bed states she is feeling better today however not at baseline.  Denies any chest pain.  No shortness of breath.  Family feels patient may have some shortness of breath with talking.  No nausea or vomiting.  Tolerating oral intake.  Objective: Vitals:   11/15/17 2034 11/16/17 0500 11/16/17 0535 11/16/17 1246  BP: (!) 115/49  (!) 126/55 (!) 96/48  Pulse: 83  82 91  Resp: (!) 22  18 18   Temp: 99.7 F (37.6 C)  98.2 F (36.8 C) 98.1 F (36.7 C)  TempSrc: Oral  Axillary Oral  SpO2: 100%  98% 95%  Weight:  70.1 kg (154 lb 8.7 oz) 71.5 kg (157 lb 10.1 oz)   Height:        Intake/Output Summary (Last 24 hours) at 11/16/2017 1648 Last data filed at 11/16/2017 1600 Gross per 24 hour  Intake 2600 ml  Output -  Net 2600 ml   Filed Weights   11/15/17 0500 11/16/17 0500 11/16/17 0535  Weight: 68.2 kg (150 lb 5.7 oz) 70.1 kg (154 lb 8.7 oz) 71.5 kg (157 lb 10.1 oz)    Examination:  General exam: Appears calm and comfortable  Respiratory system: Clear to auscultation.  No wheezes, no crackles, no rhonchi respiratory effort normal. Cardiovascular system: S1 & S2 heard, RRR. No JVD, murmurs, rubs,  gallops or clicks. No pedal edema. Gastrointestinal system: Abdomen is nondistended, soft and nontender. No organomegaly or masses felt. Normal bowel sounds heard. Central nervous system: Alert and oriented. No focal neurological deficits. Extremities: Symmetric 5 x 5 power. Skin: No rashes, lesions or ulcers Psychiatry: Judgement and insight appear normal. Mood & affect appropriate.     Data Reviewed: I have personally reviewed following labs and imaging studies  CBC: Recent Labs  Lab 11/14/17 2030  11/15/17 0253 11/16/17 0900  WBC 14.7* 15.2* 12.1*  NEUTROABS 13.4* 13.3* 10.9*  HGB 13.3 11.9* 10.3*  HCT 39.4 35.1* 30.6*  MCV 92.3 91.6 91.1  PLT 143* 124* 202*   Basic Metabolic Panel: Recent Labs  Lab 11/14/17 2030 11/15/17 0248 11/15/17 0253 11/16/17 0512  NA 132*  --  136 136  K 3.0*  --  3.3* 3.4*  CL 96*  --  100* 104  CO2 26  --  26 24  GLUCOSE 183*  --  145* 118*  BUN 26*  --  24* 28*  CREATININE 1.52*  --  1.51* 1.20*  CALCIUM 8.6*  --  8.1* 7.9*  MG  --  1.5*  --  2.8*   GFR: Estimated Creatinine Clearance: 35.4 mL/min (A) (by C-G formula based on SCr of 1.2 mg/dL (H)). Liver Function Tests: Recent Labs  Lab 11/14/17 2030 11/15/17 0253  AST 31 32  ALT 23 23  ALKPHOS 97 101  BILITOT 1.7* 2.1*  PROT 7.7 6.5  ALBUMIN 3.6 3.0*   Recent Labs  Lab 11/14/17 2030  LIPASE 16   No results for input(s): AMMONIA in the last 168 hours. Coagulation Profile: No results for input(s): INR, PROTIME in the last 168 hours. Cardiac Enzymes: Recent Labs  Lab 11/14/17 2030 11/15/17 0253 11/15/17 0748 11/15/17 1327  TROPONINI 0.03* 0.04* 0.03* 0.03*   BNP (last 3 results) No results for input(s): PROBNP in the last 8760 hours. HbA1C: No results for input(s): HGBA1C in the last 72 hours. CBG: Recent Labs  Lab 11/15/17 0813 11/15/17 1159 11/15/17 2243 11/16/17 0736 11/16/17 1154  GLUCAP 146* 154* 114* 113* 104*   Lipid Profile: No results for input(s): CHOL, HDL, LDLCALC, TRIG, CHOLHDL, LDLDIRECT in the last 72 hours. Thyroid Function Tests: No results for input(s): TSH, T4TOTAL, FREET4, T3FREE, THYROIDAB in the last 72 hours. Anemia Panel: No results for input(s): VITAMINB12, FOLATE, FERRITIN, TIBC, IRON, RETICCTPCT in the last 72 hours. Sepsis Labs: Recent Labs  Lab 11/14/17 2056 11/14/17 2323 11/15/17 0253 11/15/17 0531  PROCALCITON  --   --  12.42  --   LATICACIDVEN 2.10* 1.22 1.4 1.3    Recent Results (from the past 240 hour(s))  Urine  culture     Status: Abnormal (Preliminary result)   Collection Time: 11/14/17  8:30 PM  Result Value Ref Range Status   Specimen Description URINE, RANDOM  Final   Special Requests NONE  Final   Culture (A)  Final    >=100,000 COLONIES/mL ESCHERICHIA COLI SUSCEPTIBILITIES TO FOLLOW Performed at [facility], [address] [place], [place] [address]    Report Status PENDING  Incomplete  Blood culture (routine x 2)     Status: None (Preliminary result)   Collection Time: 11/14/17  8:45 PM  Result Value Ref Range Status   Specimen Description BLOOD RIGHT ARM  Final   Special Requests   Final    BOTTLES DRAWN AEROBIC AND ANAEROBIC Blood Culture adequate volume   Culture  Setup Time  Final    GRAM NEGATIVE RODS IN BOTH AEROBIC AND ANAEROBIC BOTTLES CRITICAL VALUE NOTED.  VALUE IS CONSISTENT WITH PREVIOUSLY REPORTED AND CALLED VALUE. Performed at [facility], [place] [address], [place], [place] [address]    Culture GRAM NEGATIVE RODS  Final   Report Status PENDING  Incomplete  Blood culture (routine x 2)     Status: Abnormal (Preliminary result)   Collection Time: 11/14/17  8:45 PM  Result Value Ref Range Status   Specimen Description BLOOD LEFT ARM  Final   Special Requests IN PEDIATRIC BOTTLE Blood Culture adequate volume  Final   Culture  Setup Time   Final    GRAM NEGATIVE RODS IN PEDIATRIC BOTTLE CRITICAL RESULT CALLED TO, READ BACK BY AND VERIFIED WITH: D WOFFORD,PHARMD AT 1101 11/15/17 BY L BENFIELD    Culture (A)  Final    ESCHERICHIA COLI SUSCEPTIBILITIES TO FOLLOW Performed at [facility], [place] [address], [place],  [address]    Report Status PENDING  Incomplete  Blood Culture ID Panel (Reflexed)     Status: Abnormal   Collection Time: 11/14/17  8:45 PM  Result Value Ref Range Status   Enterococcus species NOT DETECTED NOT DETECTED Final   Listeria monocytogenes NOT DETECTED NOT DETECTED Final   Staphylococcus species NOT DETECTED NOT  DETECTED Final   Staphylococcus aureus NOT DETECTED NOT DETECTED Final   Streptococcus species NOT DETECTED NOT DETECTED Final   Streptococcus agalactiae NOT DETECTED NOT DETECTED Final   Streptococcus pneumoniae NOT DETECTED NOT DETECTED Final   Streptococcus pyogenes NOT DETECTED NOT DETECTED Final   Acinetobacter baumannii NOT DETECTED NOT DETECTED Final   Enterobacteriaceae species DETECTED (A) NOT DETECTED Final    Comment: Enterobacteriaceae represent a large family of gram-negative bacteria, not a single organism. CRITICAL RESULT CALLED TO, READ BACK BY AND VERIFIED WITH: D WOFFORD,PHARMD AT 1101 11/15/17 BY L BENFIELD    Enterobacter cloacae complex NOT DETECTED NOT DETECTED Final   Escherichia coli DETECTED (A) NOT DETECTED Final    Comment: CRITICAL RESULT CALLED TO, READ BACK BY AND VERIFIED WITH: D WOFFORD,PHARMD AT 1101 11/15/17 BY L BENFIELD    Klebsiella oxytoca NOT DETECTED NOT DETECTED Final   Klebsiella pneumoniae NOT DETECTED NOT DETECTED Final   Proteus species NOT DETECTED NOT DETECTED Final   Serratia marcescens NOT DETECTED NOT DETECTED Final   Carbapenem resistance NOT DETECTED NOT DETECTED Final   Haemophilus influenzae NOT DETECTED NOT DETECTED Final   Neisseria meningitidis NOT DETECTED NOT DETECTED Final   Pseudomonas aeruginosa NOT DETECTED NOT DETECTED Final   Candida albicans NOT DETECTED NOT DETECTED Final   Candida glabrata NOT DETECTED NOT DETECTED Final   Candida krusei NOT DETECTED NOT DETECTED Final   Candida parapsilosis NOT DETECTED NOT DETECTED Final   Candida tropicalis NOT DETECTED NOT DETECTED Final    Comment: Performed at [facility], [place] [address] [place],  [address]         Radiology Studies: Dg Chest 2 View  Result Date: 11/14/2017 CLINICAL DATA:  Fever.  Urinary tract infection and confusion. EXAM: CHEST  2 VIEW COMPARISON:  None. FINDINGS: Mild cardiac enlargement. Aortic atherosclerosis. No pleural effusion or  edema. No airspace opacities identified. IMPRESSION: 1. No acute findings. 2.  Aortic Atherosclerosis (ICD10-I70.0). Electronically Signed   By: Kerby Moors M.D.   On: 11/14/2017 21:45   Ct Head Wo Contrast  Result Date: 11/14/2017 CLINICAL DATA:  Fever, UTI, confusion. EXAM: CT HEAD  WITHOUT CONTRAST TECHNIQUE: Contiguous axial images were obtained from the base of the skull through the vertex without intravenous contrast. COMPARISON:  None. FINDINGS: Brain: There is no evidence of acute infarct, intracranial hemorrhage, mass, midline shift, or extra-axial fluid collection. Mild generalized cerebral atrophy is within normal limits for age. Patchy cerebral white matter hypodensities are nonspecific but compatible with mild moderate chronic small vessel ischemic disease. Vascular: Calcified atherosclerosis at the skullbase. No hyperdense vessel. Skull: No fracture or focal osseous lesion. Sinuses/Orbits: Visualized paranasal sinuses and mastoid air cells are clear. Bilateral cataract extraction. Other: None. IMPRESSION: 1. No evidence of acute intracranial abnormality. 2. Mild-to-moderate chronic small vessel ischemic disease. Electronically Signed   By: Logan Bores M.D.   On: 11/14/2017 21:21        Scheduled Meds: . aspirin  325 mg Oral Daily  . atorvastatin  40 mg Oral Daily  . carvedilol  25 mg Oral BID WC  . [START ON 11/17/2017] enoxaparin (LOVENOX) injection  40 mg Subcutaneous Q24H  . hydrALAZINE  25 mg Oral BID  . insulin aspart  0-9 Units Subcutaneous TID WC  . pantoprazole  40 mg Oral Daily   Continuous Infusions: . cefTRIAXone (ROCEPHIN)  IV Stopped (11/16/17 1400)     LOS: 2 days    Time spent: 40 minutes    Irine Seal, MD [facility] Pager [phone]  If 7PM-7AM, please contact night-coverage [email or web address] Password [facility] 11/16/2017, 4:48 PM

## 2017-11-17 DIAGNOSIS — F03918 Unspecified dementia, unspecified severity, with other behavioral disturbance: Secondary | ICD-10-CM

## 2017-11-17 DIAGNOSIS — F0391 Unspecified dementia with behavioral disturbance: Secondary | ICD-10-CM

## 2017-11-17 DIAGNOSIS — R413 Other amnesia: Secondary | ICD-10-CM

## 2017-11-17 LAB — BASIC METABOLIC PANEL
ANION GAP: 7 (ref 5–15)
BUN: 24 mg/dL — AB (ref 6–20)
CALCIUM: 8 mg/dL — AB (ref 8.9–10.3)
CO2: 23 mmol/L (ref 22–32)
CREATININE: 1 mg/dL (ref 0.44–1.00)
Chloride: 102 mmol/L (ref 101–111)
GFR calc Af Amer: >60 mL/min (ref >60)
GFR, EST NON AFRICAN AMERICAN: 52 mL/min — AB (ref >60)
GLUCOSE: 98 mg/dL (ref 65–99)
Potassium: 3.9 mmol/L (ref 3.5–5.1)
Sodium: 132 mmol/L — ABNORMAL LOW (ref 135–145)

## 2017-11-17 LAB — GLUCOSE, CAPILLARY
Glucose-Capillary: 102 mg/dL — ABNORMAL HIGH (ref 65–99)
Glucose-Capillary: 97 mg/dL (ref 65–99)
Glucose-Capillary: 86 mg/dL (ref 65–99)

## 2017-11-17 LAB — CBC WITH DIFFERENTIAL/PLATELET
BASOS ABS: 0 10*3/uL (ref 0.0–0.1)
Basophils Relative: 0 %
EOS PCT: 1 %
Eosinophils Absolute: 0.1 10*3/uL (ref 0.0–0.7)
HEMATOCRIT: 31.2 % — AB (ref 36.0–46.0)
Hemoglobin: 10.6 g/dL — ABNORMAL LOW (ref 12.0–15.0)
Lymphocytes Relative: 10 %
Lymphs Abs: 1.1 10*3/uL (ref 0.7–4.0)
MCH: 30.6 pg (ref 26.0–34.0)
MCHC: 34 g/dL (ref 30.0–36.0)
MCV: 90.2 fL (ref 78.0–100.0)
MONO ABS: 0.6 10*3/uL (ref 0.1–1.0)
MONOS PCT: 6 %
Neutro Abs: 9.2 10*3/uL — ABNORMAL HIGH (ref 1.7–7.7)
Neutrophils Relative %: 83 %
PLATELETS: 133 10*3/uL — AB (ref 150–400)
RBC: 3.46 MIL/uL — ABNORMAL LOW (ref 3.87–5.11)
RDW: 14.2 % (ref 11.5–15.5)
WBC: 11 10*3/uL — ABNORMAL HIGH (ref 4.0–10.5)

## 2017-11-17 LAB — CULTURE, BLOOD (ROUTINE X 2)
SPECIAL REQUESTS: ADEQUATE
Special Requests: ADEQUATE

## 2017-11-17 LAB — URINE CULTURE

## 2017-11-17 MED ORDER — HYDRALAZINE HCL 25 MG PO TABS: 25.0000 mg | ORAL_TABLET | Freq: Two times a day (BID) | ORAL | 0 refills | Status: DC

## 2017-11-17 MED ORDER — CEFPODOXIME PROXETIL 200 MG PO TABS: 200.0000 mg | ORAL_TABLET | Freq: Two times a day (BID) | ORAL | 0 refills | Status: AC

## 2017-11-17 MED ORDER — LOSARTAN POTASSIUM 100 MG PO TABS: 100.0000 mg | ORAL_TABLET | Freq: Every day | ORAL | 0 refills | Status: DC

## 2017-11-17 NOTE — Evaluation (Signed)
Physical Therapy Evaluation Patient Details Name: Caroline Cooper [name] MRN: [id number] DOB: 30-Jun-1935 Today's Date: 11/17/2017   History of Present Illness  82 year old female history of hypertension, hyperlipidemia, diabetes brought to the ED for acute encephalopathy and admitted for sepsis secondary to E. coli bacteremia and E. coli UTI  Clinical Impression  Pt admitted with above diagnosis. Pt currently with functional limitations due to the deficits listed below (see PT Problem List). Pt will benefit from skilled PT to increase their independence and safety with mobility to allow discharge to the venue listed below.  Pt assisted with ambulating in hallway distance to tolerance.  SpO2 99% on room air during ambulation.  Recommend initial supervision for mobility upon d/c.  No family present during session.     Follow Up Recommendations Home health PT;Supervision for mobility/OOB    Equipment Recommendations  None recommended by PT    Recommendations for Other Services       Precautions / Restrictions Precautions Precautions: Fall      Mobility  Bed Mobility Overal bed mobility: Needs Assistance Bed Mobility: Supine to Sit     Supine to sit: Min guard     General bed mobility comments: provided a hand for pt to self assist trunk upright  Transfers Overall transfer level: Needs assistance Equipment used: None Transfers: Sit to/from Stand Sit to Stand: Min guard         General transfer comment: min/guard for safety, slow and effortful transfers, utilizes UEs to self assist  Ambulation/Gait Ambulation/Gait assistance: Min guard Ambulation Distance (Feet): 80 Feet Assistive device: None Gait Pattern/deviations: Step-through pattern;Decreased stride length     General Gait Details: very slow pace, mildly unsteady however no overt LOB observed; pt states she does not use assistive device  Stairs            Wheelchair Mobility    Modified Rankin  (Stroke Patients Only)       Balance Overall balance assessment: Needs assistance         Standing balance support: No upper extremity supported Standing balance-Leahy Scale: Fair                               Pertinent Vitals/Pain Pain Assessment: No/denies pain    Home Living Family/patient expects to be discharged to:: Private residence Living Arrangements: Spouse/significant other     Home Access: Stairs to enter   Technical brewer of Steps: 1 Home Layout: One level Home Equipment: None      Prior Function Level of Independence: Independent               Hand Dominance        Extremity/Trunk Assessment        Lower Extremity Assessment Lower Extremity Assessment: Generalized weakness       Communication   Communication: Other (comment)(speaks some English, Spanish primary?)  Cognition Arousal/Alertness: Awake/alert Behavior During Therapy: WFL for tasks assessed/performed Overall Cognitive Status: Difficult to assess                                 General Comments: pt speaks some English, appropriate for session, nursing staff reports ?cognition possibly not at baseline, no family present      General Comments      Exercises     Assessment/Plan    PT Assessment Patient needs continued PT services  PT  Problem List Decreased strength;Decreased mobility;Decreased balance;Decreased knowledge of use of DME;Decreased activity tolerance       PT Treatment Interventions DME instruction;Therapeutic activities;Gait training;Therapeutic exercise;Patient/family education;Stair training;Balance training;Functional mobility training    PT Goals (Current goals can be found in the Care Plan section)  Acute Rehab PT Goals PT Goal Formulation: With patient Time For Goal Achievement: 12/01/17 Potential to Achieve Goals: Good    Frequency Min 3X/week   Barriers to discharge        Co-evaluation                AM-PAC PT "6 Clicks" Daily Activity  Outcome Measure Difficulty turning over in bed (including adjusting bedclothes, sheets and blankets)?: None Difficulty moving from lying on back to sitting on the side of the bed? : None Difficulty sitting down on and standing up from a chair with arms (e.g., wheelchair, bedside commode, etc,.)?: A Little Help needed moving to and from a bed to chair (including a wheelchair)?: A Little Help needed walking in hospital room?: A Little Help needed climbing 3-5 steps with a railing? : A Little 6 Click Score: 20    End of Session   Activity Tolerance: Patient tolerated treatment well Patient left: in bed;with call bell/phone within reach;with bed alarm set Nurse Communication: Mobility status PT Visit Diagnosis: Difficulty in walking, not elsewhere classified (R26.2)    Time: 7253-6644 PT Time Calculation (min) (ACUTE ONLY): 13 min   Charges:   PT Evaluation $PT Eval Low Complexity: 1 Low     PT G CodesCarmelia Bake, PT, DPT 11/17/2017 Pager: [phone]  York Ram E 11/17/2017, 11:52 AM

## 2017-11-17 NOTE — Progress Notes (Signed)
This CM was contacted by RN for benefits check for Vantin 200mg  twice daily for 14 days. Per [employer], medication would be about $40 without insurance.   Per benefits check CO-PAY- 50 % OF TOTAL COST  TIER- 4 DRUG  PRIOR APPROVAL- NO   Pt RN alerted of information. Marney Doctor RN,BSN,NCM [phone]

## 2017-11-17 NOTE — Evaluation (Signed)
Occupational Therapy Evaluation Patient Details Name: Caroline Cooper [name] MRN: [id number] DOB: May 21, 1935 Today's Date: 11/17/2017    History of Present Illness 82 year old female history of hypertension, hyperlipidemia, diabetes brought to the ED for acute encephalopathy and admitted for sepsis secondary to E. coli bacteremia and E. coli UTI   Clinical Impression   Pt admitted with sepsis. Pt currently with functional limitations due to the deficits listed below (see OT Problem List). Pt will benefit from skilled OT to increase their safety and independence with ADL and functional mobility for ADL to facilitate discharge to venue listed below.      Follow Up Recommendations  Home health OT;Supervision/Assistance - 24 hour    Equipment Recommendations  None recommended by OT       Precautions / Restrictions Precautions Precautions: Fall      Mobility Bed Mobility Overal bed mobility: Needs Assistance Bed Mobility: Supine to Sit     Supine to sit: Supervision     General bed mobility comments: provided a hand for pt to self assist trunk upright  Transfers Overall transfer level: Needs assistance Equipment used: None Transfers: Sit to/from Stand Sit to Stand: Supervision         General transfer comment: overall S but educated family to be close in case of loss of balance    Balance Overall balance assessment: Needs assistance         Standing balance support: No upper extremity supported Standing balance-Leahy Scale: Fair                             ADL either performed or assessed with clinical judgement   ADL Overall ADL's : Needs assistance/impaired Eating/Feeding: Set up;Sitting   Grooming: Set up;Sitting   Upper Body Bathing: Set up;Sitting   Lower Body Bathing: Set up;Sit to/from stand;Cueing for sequencing;Cueing for safety   Upper Body Dressing : Set up;Sitting   Lower Body Dressing: Set up;Sit to/from stand;Cueing for  sequencing;Cueing for safety   Toilet Transfer: Set up;Comfort height toilet;Regular Toilet   Toileting- Clothing Manipulation and Hygiene: Set up;Sit to/from stand Toileting - Clothing Manipulation Details (indicate cue type and reason): VC for safety Tub/ Shower Transfer: Nurse, learning disability Details (indicate cue type and reason): simlulated shower         Vision Patient Visual Report: No change from baseline              Pertinent Vitals/Pain Pain Assessment: No/denies pain     Hand Dominance     Extremity/Trunk Assessment Upper Extremity Assessment Upper Extremity Assessment: Generalized weakness   Lower Extremity Assessment Lower Extremity Assessment: Generalized weakness       Communication Communication Communication: Other (comment)(speaks some English, Spanish primary?)   Cognition Arousal/Alertness: Awake/alert Behavior During Therapy: WFL for tasks assessed/performed Overall Cognitive Status: Difficult to assess                                 General Comments: pt speaks some English, appropriate for session, nursing staff reports ?cognition possibly not at baseline, no family present   General [place] expects to be discharged to:: Private residence Living Arrangements: Spouse/significant other Available Help at Discharge: Family   Home Access: Stairs to enter Entrance Stairs-Number of Steps: 1   Home Layout: One  level     Bathroom Shower/Tub: Tub/shower unit;Walk-in shower   Bathroom Toilet: Standard     Home Equipment: None          Prior Functioning/Environment Level of Independence: Independent                 OT Problem List: Decreased strength;Decreased activity tolerance;Decreased safety awareness;Impaired balance (sitting and/or standing)      OT Treatment/Interventions:      OT Goals(Current goals can be found in the care plan section) Acute Rehab  OT Goals Patient Stated Goal: home today OT Goal Formulation: With patient  OT Frequency:                AM-PAC PT "6 Clicks" Daily Activity     Outcome Measure Help from another person eating meals?: None Help from another person taking care of personal grooming?: None Help from another person toileting, which includes using toliet, bedpan, or urinal?: A Little Help from another person bathing (including washing, rinsing, drying)?: A Little Help from another person to put on and taking off regular upper body clothing?: None Help from another person to put on and taking off regular lower body clothing?: None 6 Click Score: 22   End of Session Nurse Communication: Mobility status  Activity Tolerance: Patient tolerated treatment well Patient left: in chair;with call bell/phone within reach;with family/visitor present  OT Visit Diagnosis: Muscle weakness (generalized) (M62.81);Unsteadiness on feet (R26.81)                Time: 1355-1411 OT Time Calculation (min): 16 min Charges:  OT General Charges $OT Visit: 1 Visit OT Evaluation $OT Eval Moderate Complexity: 1 Mod G-Codes:     Kari Baars, [place]  Payton Mccallum D 11/17/2017, 2:21 PM

## 2017-11-17 NOTE — Discharge Summary (Signed)
Physician Discharge Summary  Darnell Stimson Colon [name] ZJI:[id number] DOB: [birth year] DOA: 11/14/2017  PCP: Arnoldo Morale, MD  Admit date: 11/14/2017 Discharge date: 11/17/2017  Time spent: 65 minutes  Recommendations for Outpatient Follow-up:  1. Follow-up with Arnoldo Morale, MD in 1-2 weeks.  On follow-up patient will need a basic metabolic profile done to follow-up on electrolytes and renal function.  Patient's ARB and hydralazine were held during the hospitalization and patient is to resume those 3-4 days post discharge.  Patient's blood pressure also need to be reassessed.  Patient will need further workup for possible dementia in the outpatient setting.   Discharge Diagnoses:  Principal Problem:   Sepsis ([place]) Active Problems:   Bacteremia due to Escherichia coli   E. coli UTI   HTN (hypertension)   Type 2 diabetes, HbA1c goal < 7% (HCC)   GERD (gastroesophageal reflux disease)   AKI (acute kidney injury) ([place])   Acute encephalopathy   Altered mental status   Urinary tract infection with hematuria   Memory loss   Discharge Condition: Stable and improved  Diet recommendation: Heart healthy  Filed Weights   11/16/17 0500 11/16/17 0535 11/17/17 0300  Weight: 70.1 kg (154 lb 8.7 oz) 71.5 kg (157 lb 10.1 oz) 69.9 kg (154 lb 1.6 oz)    History of present illness:  Per Downs is a 82 y.o. female with history of hypertension, hyperlipidemia and diabetes mellitus was brought to the ER after patient was found to be confused by patient's son.  Patient was last seen normal by his patient's son 2 days ago and was found to be increasingly confused since then and also had a fall but did not hit her head and was held by her husband before she went on to the floor.  Since patient was persistently confused patient was brought to the ER at [facility].  Patient denied any nausea vomiting chest pain shortness of breath abdominal pain or  diarrhea.  ED Course: In the ER patient had a fever of 103 F appeared confused CT head was unremarkable chest x-ray was unremarkable UA was consistent with UTI.  Lactate is elevated labs show leukocytosis and creatinine was elevated from baseline.  Patient was placed on sepsis protocol started on ceftriaxone and admitted for sepsis.    Hospital Course:  #1 sepsis secondary to E. coli bacteremia and E. coli UTI Patient had presented with altered mental status, urinalysis initially done on admission was concerning for possible UTI.  Patient pancultured.  Blood cultures now positive for E. coli bacteremia likely have seeded from the urine.  Urine cultures positive for E. coli.  Sensitivities of blood cultures and urine cultures were pansensitive. Chest x-ray negative.  Patient remained afebrile.  Lactic acid level trended down.  Leukocytosis trended down.  Patient was empirically placed on IV Rocephin during the hospitalization.  Patient improved clinically and was close to baseline by day of discharge.  Patient will be discharged on oral Vantin for 7 more days to complete a 10-day course of antibiotic treatment.  Outpatient follow-up with PCP.  2.  Well-controlled type 2 diabetes mellitus Hemoglobin A1c 5.9 on 09/03/2017.  She was maintained on sliding scale insulin throughout the hospitalization.  Outpatient follow-up.  3.  Borderline blood pressure Blood pressure borderline.  Patient's hydralazine and Cozaar were held.  Patient Coreg dose was decreased to half home dose.  Blood pressure improved.  Patient be discharged home back on home regimen of Coreg  and patient's Cozaar and hydralazine will be resumed a few days post discharge.  Outpatient follow-up with PCP.   4.  Acute kidney injury Likely secondary to a prerenal azotemia.  Improved with hydration.  Patient's ARB was held initially and will be resumed 3-4 days post discharge.  Outpatient follow-up.   5.  Hypokalemia Repleted.  6.   Gastroesophageal reflux disease Patient was maintained on PPI.  7.  Memory loss/probable early dementia Family had noted that over the past few months patient has been having some memory loss and there were concerns for possible early dementia.  Patient will follow-up with PCP in the outpatient setting and further workup to be done at that time.    Procedures:  CT head without contrast 11/14/2017  Chest x-ray 11/14/2017      Consultations:  None  Discharge Exam: Vitals:   11/17/17 0300 11/17/17 0928  BP: (!) 133/59   Pulse: 82   Resp: 17   Temp: 99 F (37.2 C)   SpO2: 96% 99%    General: NAD Cardiovascular: RRR Respiratory: CTAB  Discharge Instructions   Discharge Instructions    Diet - low sodium heart healthy   Complete by:  As directed    Increase activity slowly   Complete by:  As directed      Allergies as of 11/17/2017      Reactions   Penicillins Rash   Has patient had a PCN reaction causing immediate rash, facial/tongue/throat swelling, SOB or lightheadedness with hypotension: No Has patient had a PCN reaction causing severe rash involving mucus membranes or skin necrosis: No Has patient had a PCN reaction that required hospitalization No Has patient had a PCN reaction occurring within the last 10 years: No If all of the above answers are "NO", then may proceed with Cephalosporin use.      Medication List    STOP taking these medications   traMADol 50 MG tablet Commonly known as:  ULTRAM     TAKE these medications   acetaminophen 500 MG tablet Commonly known as:  TYLENOL Take 1,000 mg by mouth every 8 (eight) hours as needed for moderate pain or headache.   atorvastatin 40 MG tablet Commonly known as:  LIPITOR Take 1 tablet (40 mg total) by mouth daily.   carvedilol 25 MG tablet Commonly known as:  COREG TAKE 1 TABLET BY MOUTH TWICE DAILY WITH MEAL   cefpodoxime 200 MG tablet Commonly known as:  VANTIN Take 1 tablet (200 mg total)  by mouth 2 (two) times daily for 7 days. Start taking on:  11/18/2017   hydrALAZINE 25 MG tablet Commonly known as:  APRESOLINE Take 1 tablet (25 mg total) by mouth 2 (two) times daily. Start taking on:  11/19/2017 What changed:  These instructions start on 11/19/2017. If you are unsure what to do until then, ask your doctor or other care provider.   losartan 100 MG tablet Commonly known as:  COZAAR Take 1 tablet (100 mg total) by mouth daily. Start taking on:  11/21/2017 What changed:  These instructions start on 11/21/2017. If you are unsure what to do until then, ask your doctor or other care provider.   olopatadine 0.1 % ophthalmic solution Commonly known as:  PATANOL Place 1 drop into both eyes 2 (two) times daily.   omeprazole 20 MG capsule Commonly known as:  PRILOSEC Take 1 capsule (20 mg total) by mouth 2 (two) times daily before a meal.      Allergies  Allergen  Reactions  . Penicillins Rash     Has patient had a PCN reaction causing immediate rash, facial/tongue/throat swelling, SOB or lightheadedness with hypotension: No Has patient had a PCN reaction causing severe rash involving mucus membranes or skin necrosis: No Has patient had a PCN reaction that required hospitalization No Has patient had a PCN reaction occurring within the last 10 years: No If all of the above answers are "NO", then may proceed with Cephalosporin use.    Follow-up Information    Arnoldo Morale, MD. Schedule an appointment as soon as possible for a visit in 1 week(s).   Specialty:  Family Medicine Why:  f/u in 1-2 weeks. Contact information: [place] [address] [phone]            The results of significant diagnostics from this hospitalization (including imaging, microbiology, ancillary and laboratory) are listed below for reference.    Significant Diagnostic Studies: Dg Chest 2 View  Result Date: 11/14/2017 CLINICAL DATA:  Fever.  Urinary tract  infection and confusion. EXAM: CHEST  2 VIEW COMPARISON:  None. FINDINGS: Mild cardiac enlargement. Aortic atherosclerosis. No pleural effusion or edema. No airspace opacities identified. IMPRESSION: 1. No acute findings. 2.  Aortic Atherosclerosis (ICD10-I70.0). Electronically Signed   By: Kerby Moors M.D.   On: 11/14/2017 21:45   Ct Head Wo Contrast  Result Date: 11/14/2017 CLINICAL DATA:  Fever, UTI, confusion. EXAM: CT HEAD WITHOUT CONTRAST TECHNIQUE: Contiguous axial images were obtained from the base of the skull through the vertex without intravenous contrast. COMPARISON:  None. FINDINGS: Brain: There is no evidence of acute infarct, intracranial hemorrhage, mass, midline shift, or extra-axial fluid collection. Mild generalized cerebral atrophy is within normal limits for age. Patchy cerebral white matter hypodensities are nonspecific but compatible with mild moderate chronic small vessel ischemic disease. Vascular: Calcified atherosclerosis at the skullbase. No hyperdense vessel. Skull: No fracture or focal osseous lesion. Sinuses/Orbits: Visualized paranasal sinuses and mastoid air cells are clear. Bilateral cataract extraction. Other: None. IMPRESSION: 1. No evidence of acute intracranial abnormality. 2. Mild-to-moderate chronic small vessel ischemic disease. Electronically Signed   By: Logan Bores M.D.   On: 11/14/2017 21:21    Microbiology: Recent Results (from the past 240 hour(s))  Urine culture     Status: Abnormal   Collection Time: 11/14/17  8:30 PM  Result Value Ref Range Status   Specimen Description URINE, RANDOM  Final   Special Requests NONE  Final   Culture >=100,000 COLONIES/mL ESCHERICHIA COLI (A)  Final   Report Status 11/17/2017 FINAL  Final   Organism ID, Bacteria ESCHERICHIA COLI (A)  Final      Susceptibility   Escherichia coli - MIC*    AMPICILLIN <=2 SENSITIVE Sensitive     CEFAZOLIN <=4 SENSITIVE Sensitive     CEFTRIAXONE <=1 SENSITIVE Sensitive      CIPROFLOXACIN <=0.25 SENSITIVE Sensitive     GENTAMICIN <=1 SENSITIVE Sensitive     IMIPENEM <=0.25 SENSITIVE Sensitive     NITROFURANTOIN <=16 SENSITIVE Sensitive     TRIMETH/SULFA <=20 SENSITIVE Sensitive     AMPICILLIN/SULBACTAM <=2 SENSITIVE Sensitive     PIP/TAZO <=4 SENSITIVE Sensitive     Extended ESBL NEGATIVE Sensitive     * >=100,000 COLONIES/mL ESCHERICHIA COLI  Blood culture (routine x 2)     Status: Abnormal   Collection Time: 11/14/17  8:45 PM  Result Value Ref Range Status   Specimen Description BLOOD RIGHT ARM  Final   Special Requests  Final    BOTTLES DRAWN AEROBIC AND ANAEROBIC Blood Culture adequate volume   Culture  Setup Time   Final    GRAM NEGATIVE RODS IN BOTH AEROBIC AND ANAEROBIC BOTTLES CRITICAL VALUE NOTED.  VALUE IS CONSISTENT WITH PREVIOUSLY REPORTED AND CALLED VALUE.    Culture (A)  Final    ESCHERICHIA COLI SUSCEPTIBILITIES PERFORMED ON PREVIOUS CULTURE WITHIN THE LAST 5 DAYS. Performed at [facility], [place] [address], [place], [place] [address]    Report Status 11/17/2017 FINAL  Final  Blood culture (routine x 2)     Status: Abnormal   Collection Time: 11/14/17  8:45 PM  Result Value Ref Range Status   Specimen Description BLOOD LEFT ARM  Final   Special Requests IN PEDIATRIC BOTTLE Blood Culture adequate volume  Final   Culture  Setup Time   Final    GRAM NEGATIVE RODS IN PEDIATRIC BOTTLE CRITICAL RESULT CALLED TO, READ BACK BY AND VERIFIED WITH: D WOFFORD,PHARMD AT 1101 11/15/17 BY L BENFIELD Performed at [facility], [place] [address] [place], [place] [address]    Culture ESCHERICHIA COLI (A)  Final   Report Status 11/17/2017 FINAL  Final   Organism ID, Bacteria ESCHERICHIA COLI  Final      Susceptibility   Escherichia coli - MIC*    AMPICILLIN <=2 SENSITIVE Sensitive     CEFAZOLIN <=4 SENSITIVE Sensitive     CEFEPIME <=1 SENSITIVE Sensitive     CEFTAZIDIME <=1 SENSITIVE Sensitive     CEFTRIAXONE <=1 SENSITIVE Sensitive      CIPROFLOXACIN <=0.25 SENSITIVE Sensitive     GENTAMICIN <=1 SENSITIVE Sensitive     IMIPENEM <=0.25 SENSITIVE Sensitive     TRIMETH/SULFA <=20 SENSITIVE Sensitive     AMPICILLIN/SULBACTAM <=2 SENSITIVE Sensitive     PIP/TAZO <=4 SENSITIVE Sensitive     Extended ESBL NEGATIVE Sensitive     * ESCHERICHIA COLI  Blood Culture ID Panel (Reflexed)     Status: Abnormal   Collection Time: 11/14/17  8:45 PM  Result Value Ref Range Status   Enterococcus species NOT DETECTED NOT DETECTED Final   Listeria monocytogenes NOT DETECTED NOT DETECTED Final   Staphylococcus species NOT DETECTED NOT DETECTED Final   Staphylococcus aureus NOT DETECTED NOT DETECTED Final   Streptococcus species NOT DETECTED NOT DETECTED Final   Streptococcus agalactiae NOT DETECTED NOT DETECTED Final   Streptococcus pneumoniae NOT DETECTED NOT DETECTED Final   Streptococcus pyogenes NOT DETECTED NOT DETECTED Final   Acinetobacter baumannii NOT DETECTED NOT DETECTED Final   Enterobacteriaceae species DETECTED (A) NOT DETECTED Final    Comment: Enterobacteriaceae represent a large family of gram-negative bacteria, not a single organism. CRITICAL RESULT CALLED TO, READ BACK BY AND VERIFIED WITH: D WOFFORD,PHARMD AT 1101 11/15/17 BY L BENFIELD    Enterobacter cloacae complex NOT DETECTED NOT DETECTED Final   Escherichia coli DETECTED (A) NOT DETECTED Final    Comment: CRITICAL RESULT CALLED TO, READ BACK BY AND VERIFIED WITH: D WOFFORD,PHARMD AT 1101 11/15/17 BY L BENFIELD    Klebsiella oxytoca NOT DETECTED NOT DETECTED Final   Klebsiella pneumoniae NOT DETECTED NOT DETECTED Final   Proteus species NOT DETECTED NOT DETECTED Final   Serratia marcescens NOT DETECTED NOT DETECTED Final   Carbapenem resistance NOT DETECTED NOT DETECTED Final   Haemophilus influenzae NOT DETECTED NOT DETECTED Final   Neisseria meningitidis NOT DETECTED NOT DETECTED Final   Pseudomonas aeruginosa NOT DETECTED NOT DETECTED Final   Candida  albicans NOT  DETECTED NOT DETECTED Final   Candida glabrata NOT DETECTED NOT DETECTED Final   Candida krusei NOT DETECTED NOT DETECTED Final   Candida parapsilosis NOT DETECTED NOT DETECTED Final   Candida tropicalis NOT DETECTED NOT DETECTED Final    Comment: Performed at [facility], [place] [address], [place], [place] [address]     Labs: Basic Metabolic Panel: Recent Labs  Lab 11/14/17 2030 11/15/17 0248 11/15/17 0253 11/16/17 0512 11/17/17 0431  NA 132*  --  136 136 132*  K 3.0*  --  3.3* 3.4* 3.9  CL 96*  --  100* 104 102  CO2 26  --  26 24 23   GLUCOSE 183*  --  145* 118* 98  BUN 26*  --  24* 28* 24*  CREATININE 1.52*  --  1.51* 1.20* 1.00  CALCIUM 8.6*  --  8.1* 7.9* 8.0*  MG  --  1.5*  --  2.8*  --    Liver Function Tests: Recent Labs  Lab 11/14/17 2030 11/15/17 0253  AST 31 32  ALT 23 23  ALKPHOS 97 101  BILITOT 1.7* 2.1*  PROT 7.7 6.5  ALBUMIN 3.6 3.0*   Recent Labs  Lab 11/14/17 2030  LIPASE 16   No results for input(s): AMMONIA in the last 168 hours. CBC: Recent Labs  Lab 11/14/17 2030 11/15/17 0253 11/16/17 0900 11/17/17 0431  WBC 14.7* 15.2* 12.1* 11.0*  NEUTROABS 13.4* 13.3* 10.9* 9.2*  HGB 13.3 11.9* 10.3* 10.6*  HCT 39.4 35.1* 30.6* 31.2*  MCV 92.3 91.6 91.1 90.2  PLT 143* 124* 117* 133*   Cardiac Enzymes: Recent Labs  Lab 11/14/17 2030 11/15/17 0253 11/15/17 0748 11/15/17 1327  TROPONINI 0.03* 0.04* 0.03* 0.03*   BNP: BNP (last 3 results) No results for input(s): BNP in the last 8760 hours.  ProBNP (last 3 results) No results for input(s): PROBNP in the last 8760 hours.  CBG: Recent Labs  Lab 11/16/17 1154 11/16/17 1730 11/16/17 2020 11/17/17 0734 11/17/17 1132  GLUCAP 104* 124* 116* 97 86       Signed:  Irine Seal MD.  [facility] 11/17/2017, 2:42 PM

## 2017-11-17 NOTE — Care Management Note (Signed)
Case Management Note  Patient Details  Name: Jalana Moore Colon [name] MRN: [id number] Date of Birth: 1934-12-21  Subjective/Objective:   82 yo admitted with Sepsis.                 Action/Plan: From home with spouse and son lives nearby. Choice offered for home health services and [facility] chosen. [facility] rep alerted of referral.  Expected Discharge Date:                  Expected Discharge Plan:  [place]  In-House Referral:     Discharge planning Services  CM Consult  Post Acute Care Choice:  Home Health Choice offered to:  Patient, Spouse, Adult Children  DME Arranged:    DME Agency:     HH Arranged:  PT, OT, Nurse's Aide, Social Work [employer] Agency:  [place]  Status of Service:  In process, will continue to follow  If discussed at Long Length of Stay Meetings, dates discussed:    Additional CommentsLynnell Catalan, RN 11/17/2017, 1:29 PM  [phone]

## 2017-11-17 NOTE — Progress Notes (Signed)
SATURATION QUALIFICATIONS: (This note is used to comply with regulatory documentation for home oxygen)  Patient Saturations on Room Air at Rest = 100%  Patient Saturations on Room Air while Ambulating = 99%  

## 2017-11-18 ENCOUNTER — Telehealth: Payer: Self-pay | Admitting: Family Medicine

## 2017-11-18 DIAGNOSIS — N390 Urinary tract infection, site not specified: Secondary | ICD-10-CM | POA: Diagnosis not present

## 2017-11-18 DIAGNOSIS — A4151 Sepsis due to Escherichia coli [E. coli]: Secondary | ICD-10-CM | POA: Diagnosis not present

## 2017-11-18 NOTE — Telephone Encounter (Signed)
Noted  

## 2017-11-18 NOTE — Telephone Encounter (Signed)
Physical Therapy called to informed you that she saw the pt today and they are going to see we 2 times a week for 4 weeks

## 2017-11-19 DIAGNOSIS — A4151 Sepsis due to Escherichia coli [E. coli]: Secondary | ICD-10-CM | POA: Diagnosis not present

## 2017-11-19 DIAGNOSIS — N390 Urinary tract infection, site not specified: Secondary | ICD-10-CM | POA: Diagnosis not present

## 2017-11-21 ENCOUNTER — Telehealth: Payer: Self-pay | Admitting: Family Medicine

## 2017-11-21 ENCOUNTER — Telehealth: Payer: Self-pay

## 2017-11-21 DIAGNOSIS — N390 Urinary tract infection, site not specified: Secondary | ICD-10-CM | POA: Diagnosis not present

## 2017-11-21 DIAGNOSIS — A4151 Sepsis due to Escherichia coli [E. coli]: Secondary | ICD-10-CM | POA: Diagnosis not present

## 2017-11-21 NOTE — Telephone Encounter (Signed)
Caroline Cooper from [facility] called to request Occupational therapy once a week for 3 weeks,ADL transfer,IADL transfer,Exercise.DEC when goals met or maximum potential. Please follow up

## 2017-11-21 NOTE — Telephone Encounter (Signed)
Timmothy Sours was called and given verbal orders to do Occupational therapy for 3 weeks.

## 2017-11-21 NOTE — Telephone Encounter (Signed)
Okay to give orders? 

## 2017-11-26 ENCOUNTER — Other Ambulatory Visit: Payer: Self-pay | Admitting: Family Medicine

## 2017-11-26 DIAGNOSIS — N390 Urinary tract infection, site not specified: Secondary | ICD-10-CM | POA: Diagnosis not present

## 2017-11-26 DIAGNOSIS — A4151 Sepsis due to Escherichia coli [E. coli]: Secondary | ICD-10-CM | POA: Diagnosis not present

## 2017-11-27 DIAGNOSIS — A4151 Sepsis due to Escherichia coli [E. coli]: Secondary | ICD-10-CM | POA: Diagnosis not present

## 2017-11-27 DIAGNOSIS — N390 Urinary tract infection, site not specified: Secondary | ICD-10-CM | POA: Diagnosis not present

## 2017-11-28 DIAGNOSIS — A4151 Sepsis due to Escherichia coli [E. coli]: Secondary | ICD-10-CM | POA: Diagnosis not present

## 2017-11-28 DIAGNOSIS — N390 Urinary tract infection, site not specified: Secondary | ICD-10-CM | POA: Diagnosis not present

## 2017-12-03 ENCOUNTER — Ambulatory Visit: Payer: Medicare Other | Attending: Family Medicine | Admitting: Family Medicine

## 2017-12-03 ENCOUNTER — Encounter: Payer: Self-pay | Admitting: Family Medicine

## 2017-12-03 VITALS — BP 150/70 | HR 61 | Temp 97.8°F | Ht 64.0 in | Wt 147.6 lb

## 2017-12-03 DIAGNOSIS — Z880 Allergy status to penicillin: Secondary | ICD-10-CM | POA: Diagnosis not present

## 2017-12-03 DIAGNOSIS — I10 Essential (primary) hypertension: Secondary | ICD-10-CM

## 2017-12-03 DIAGNOSIS — E785 Hyperlipidemia, unspecified: Secondary | ICD-10-CM | POA: Insufficient documentation

## 2017-12-03 DIAGNOSIS — N3281 Overactive bladder: Secondary | ICD-10-CM | POA: Insufficient documentation

## 2017-12-03 DIAGNOSIS — F329 Major depressive disorder, single episode, unspecified: Secondary | ICD-10-CM | POA: Diagnosis not present

## 2017-12-03 DIAGNOSIS — Z9049 Acquired absence of other specified parts of digestive tract: Secondary | ICD-10-CM | POA: Diagnosis not present

## 2017-12-03 DIAGNOSIS — Z90710 Acquired absence of both cervix and uterus: Secondary | ICD-10-CM | POA: Insufficient documentation

## 2017-12-03 DIAGNOSIS — Z79899 Other long term (current) drug therapy: Secondary | ICD-10-CM | POA: Insufficient documentation

## 2017-12-03 DIAGNOSIS — N179 Acute kidney failure, unspecified: Secondary | ICD-10-CM | POA: Diagnosis not present

## 2017-12-03 DIAGNOSIS — R413 Other amnesia: Secondary | ICD-10-CM

## 2017-12-03 DIAGNOSIS — E119 Type 2 diabetes mellitus without complications: Secondary | ICD-10-CM | POA: Diagnosis not present

## 2017-12-03 DIAGNOSIS — Z9889 Other specified postprocedural states: Secondary | ICD-10-CM | POA: Diagnosis not present

## 2017-12-03 LAB — GLUCOSE, POCT (MANUAL RESULT ENTRY): POC GLUCOSE: 92 mg/dL (ref 70–99)

## 2017-12-03 LAB — POCT GLYCOSYLATED HEMOGLOBIN (HGB A1C): Hemoglobin A1C: 6.2

## 2017-12-03 MED ORDER — DONEPEZIL HCL 10 MG PO TABS: 10.0000 mg | ORAL_TABLET | Freq: Every day | ORAL | 3 refills | Status: DC

## 2017-12-03 NOTE — Patient Instructions (Signed)

## 2017-12-03 NOTE — Progress Notes (Signed)
Subjective:  Patient ID: Caroline Cooper, female    DOB: 30-Aug-1935  Age: 83 y.o. MRN: [id number]  CC: Diabetes and Hospitalization Follow-up   HPI Breona Cherubin Colon [name] is an 83 year old female with a history of hypertension, type 2 diabetes mellitus (A1c 5.9), depression, overactive bladder who comes in today for a follow-up visit accompanied by her son.  She had a recent hospitalization from 11/14/17 through 11/17/17 for acute encephalopathy secondary to UTI after she had presented with altered mental status, was found to be febrile with a T-max of 103, UA was positive for UTI,  Urine and blood cultures positive for E. Coli. Her hydralazine and losartan were held transiently due to acute kidney injury with creatinine trending up from a baseline of 0.9 to 1.52 and she was advised to resume on discharge. She was treated with IV Rocephin with improvement in symptoms after which she was discharged on oral Vantin.  Son denies any symptoms of confusion and the patient reports feeling well.  She has however declined to participate in physical therapy which was ordered at discharge as she states she does not need it. Family has been concerned about short-term memory loss but the patient has been unwilling to commence medications for this.  Her blood pressure is elevated and she endorses compliance with all her antihypertensives and she has resumed her losartan and hydralazine.  Past Medical History:  Diagnosis Date  . Diabetes mellitus without complication ([place])   . Diverticulitis   . DM (diabetes mellitus) ([place]) 09/24/2015  . HTN (hypertension) 09/24/2015  . Hyperlipidemia 09/24/2015  . Hypertension     Past Surgical History:  Procedure Laterality Date  . ABDOMINAL HYSTERECTOMY    . CHOLECYSTECTOMY    . COLON SURGERY     COLECTOMY FOR DIVERTICULITIS  . HERNIA REPAIR      Allergies  Allergen Reactions  . Penicillins Rash     Has patient had a PCN reaction causing  immediate rash, facial/tongue/throat swelling, SOB or lightheadedness with hypotension: No Has patient had a PCN reaction causing severe rash involving mucus membranes or skin necrosis: No Has patient had a PCN reaction that required hospitalization No Has patient had a PCN reaction occurring within the last 10 years: No If all of the above answers are "NO", then may proceed with Cephalosporin use.      Outpatient Medications Prior to Visit  Medication Sig Dispense Refill  . acetaminophen (TYLENOL) 500 MG tablet Take 1,000 mg by mouth every 8 (eight) hours as needed for moderate pain or headache.    Marland Kitchen atorvastatin (LIPITOR) 40 MG tablet Take 1 tablet (40 mg total) by mouth daily. 90 tablet 1  . carvedilol (COREG) 25 MG tablet TAKE 1 TABLET BY MOUTH TWICE DAILY WITH MEAL 180 tablet 1  . hydrALAZINE (APRESOLINE) 25 MG tablet Take 1 tablet (25 mg total) by mouth 2 (two) times daily. 180 tablet 0  . losartan (COZAAR) 100 MG tablet Take 1 tablet (100 mg total) by mouth daily. 90 tablet 0  . omeprazole (PRILOSEC) 20 MG capsule TAKE ONE CAPSULE BY MOUTH TWICE DAILY BEFORE A MEAL 60 capsule 0  . olopatadine (PATANOL) 0.1 % ophthalmic solution Place 1 drop into both eyes 2 (two) times daily. (Patient not taking: Reported on 11/15/2017) 5 mL 2   No facility-administered medications prior to visit.     ROS Review of Systems  Constitutional: Negative for activity change, appetite change and fatigue.  HENT: Negative for congestion, sinus pressure  and sore throat.   Eyes: Negative for visual disturbance.  Respiratory: Negative for cough, chest tightness, shortness of breath and wheezing.   Cardiovascular: Negative for chest pain and palpitations.  Gastrointestinal: Negative for abdominal distention, abdominal pain and constipation.  Endocrine: Negative for polydipsia.  Genitourinary: Negative for dysuria and frequency.  Musculoskeletal: Negative for arthralgias and back pain.  Skin: Negative for  rash.  Neurological: Negative for tremors, light-headedness and numbness.  Hematological: Does not bruise/bleed easily.  Psychiatric/Behavioral: Negative for agitation and behavioral problems.    Objective:  BP (!) 150/70   Pulse 61   Temp 97.8 F (36.6 C) (Oral)   Ht 5\' 4"  (1.626 m)   Wt 147 lb 9.6 oz (67 kg)   SpO2 98%   BMI 25.34 kg/m   BP/Weight 12/03/2017 11/17/2017 89/01/7341  Systolic BP 876 811 572  Diastolic BP 70 59 82  Wt. (Lbs) 147.6 154.1 152.2  BMI 25.34 27.3 26.13      Physical Exam  Constitutional: She is oriented to person, place, and time. She appears well-developed and well-nourished.  Cardiovascular: Normal rate, normal heart sounds and intact distal pulses.  No murmur heard. Pulmonary/Chest: Effort normal and breath sounds normal. She has no wheezes. She has no rales. She exhibits no tenderness.  Abdominal: Soft. Bowel sounds are normal. She exhibits no distension and no mass. There is no tenderness.  Musculoskeletal: Normal range of motion.  Neurological: She is alert and oriented to person, place, and time.  Skin: Skin is warm and dry.  Psychiatric: She has a normal mood and affect.     Lab Results  Component Value Date   HGBA1C 6.2 12/03/2017    Assessment & Plan:   1. Diabetes mellitus without complication ([place]) Diet controlled with A1c of 6.2 Diabetic diet - POCT glucose (manual entry) - POCT glycosylated hemoglobin (Hb I2M) - Basic Metabolic Panel  2. Memory loss She does have short-term memory loss After discussion with the patient and her son she is agreeable to commencing Aricept-I have informed them Aricept does not reverse memory loss but does slow the progression. - donepezil (ARICEPT) 10 MG tablet; Take 1 tablet (10 mg total) by mouth at bedtime.  Dispense: 30 tablet; Refill: 3  3. Essential hypertension Uncontrolled She endorses compliance with all her antihypertensive No regimen change today Low-sodium, DASH diet   Meds  ordered this encounter  Medications  . donepezil (ARICEPT) 10 MG tablet    Sig: Take 1 tablet (10 mg total) by mouth at bedtime.    Dispense:  30 tablet    Refill:  3    Follow-up: Return in about 3 months (around 03/03/2018) for Follow-up of diabetes and hypertension.   Arnoldo Morale MD

## 2017-12-04 ENCOUNTER — Telehealth: Payer: Self-pay | Admitting: Family Medicine

## 2017-12-04 DIAGNOSIS — N390 Urinary tract infection, site not specified: Secondary | ICD-10-CM | POA: Diagnosis not present

## 2017-12-04 DIAGNOSIS — A4151 Sepsis due to Escherichia coli [E. coli]: Secondary | ICD-10-CM | POA: Diagnosis not present

## 2017-12-04 LAB — BASIC METABOLIC PANEL
BUN / CREAT RATIO: 16 (ref 12–28)
BUN: 19 mg/dL (ref 8–27)
CHLORIDE: 97 mmol/L (ref 96–106)
CO2: 24 mmol/L (ref 20–29)
Calcium: 8.8 mg/dL (ref 8.7–10.3)
Creatinine, Ser: 1.19 mg/dL — ABNORMAL HIGH (ref 0.57–1.00)
GFR, EST AFRICAN AMERICAN: 50 mL/min/{1.73_m2} — AB (ref >59)
GFR, EST NON AFRICAN AMERICAN: 43 mL/min/{1.73_m2} — AB (ref >59)
Glucose: 93 mg/dL (ref 65–99)
Potassium: 4.1 mmol/L (ref 3.5–5.2)
Sodium: 141 mmol/L (ref 134–144)

## 2017-12-04 NOTE — Telephone Encounter (Signed)
Physical therapy called to inform that pt has been discharge from Physical therapy and occupation therapy as well per Pt and Son request

## 2017-12-31 ENCOUNTER — Other Ambulatory Visit: Payer: Self-pay | Admitting: Family Medicine

## 2018-01-04 ENCOUNTER — Other Ambulatory Visit: Payer: Self-pay | Admitting: Physician Assistant

## 2018-01-04 DIAGNOSIS — I10 Essential (primary) hypertension: Secondary | ICD-10-CM

## 2018-01-27 ENCOUNTER — Other Ambulatory Visit: Payer: Self-pay | Admitting: Family Medicine

## 2018-02-27 ENCOUNTER — Other Ambulatory Visit: Payer: Self-pay | Admitting: Family Medicine

## 2018-02-27 DIAGNOSIS — I10 Essential (primary) hypertension: Secondary | ICD-10-CM

## 2018-03-04 ENCOUNTER — Ambulatory Visit: Payer: Medicare Other | Attending: Family Medicine | Admitting: Family Medicine

## 2018-03-04 ENCOUNTER — Encounter: Payer: Self-pay | Admitting: Family Medicine

## 2018-03-04 ENCOUNTER — Ambulatory Visit (HOSPITAL_BASED_OUTPATIENT_CLINIC_OR_DEPARTMENT_OTHER): Payer: Medicare Other | Admitting: Family Medicine

## 2018-03-04 VITALS — BP 135/68 | HR 66 | Temp 97.3°F | Ht 64.0 in | Wt 153.0 lb

## 2018-03-04 DIAGNOSIS — F329 Major depressive disorder, single episode, unspecified: Secondary | ICD-10-CM | POA: Diagnosis not present

## 2018-03-04 DIAGNOSIS — Z880 Allergy status to penicillin: Secondary | ICD-10-CM | POA: Diagnosis not present

## 2018-03-04 DIAGNOSIS — N3281 Overactive bladder: Secondary | ICD-10-CM | POA: Diagnosis not present

## 2018-03-04 DIAGNOSIS — E7800 Pure hypercholesterolemia, unspecified: Secondary | ICD-10-CM | POA: Insufficient documentation

## 2018-03-04 DIAGNOSIS — R413 Other amnesia: Secondary | ICD-10-CM | POA: Insufficient documentation

## 2018-03-04 DIAGNOSIS — I10 Essential (primary) hypertension: Secondary | ICD-10-CM | POA: Insufficient documentation

## 2018-03-04 DIAGNOSIS — E785 Hyperlipidemia, unspecified: Secondary | ICD-10-CM | POA: Insufficient documentation

## 2018-03-04 DIAGNOSIS — Z79899 Other long term (current) drug therapy: Secondary | ICD-10-CM | POA: Diagnosis not present

## 2018-03-04 DIAGNOSIS — E119 Type 2 diabetes mellitus without complications: Secondary | ICD-10-CM | POA: Insufficient documentation

## 2018-03-04 LAB — POCT GLYCOSYLATED HEMOGLOBIN (HGB A1C): HEMOGLOBIN A1C: 5.8

## 2018-03-04 LAB — GLUCOSE, POCT (MANUAL RESULT ENTRY): POC Glucose: 153 mg/dl — AB (ref 70–99)

## 2018-03-04 MED ORDER — LOSARTAN POTASSIUM 100 MG PO TABS: 100.0000 mg | ORAL_TABLET | Freq: Every day | ORAL | 1 refills | Status: DC

## 2018-03-04 MED ORDER — HYDRALAZINE HCL 25 MG PO TABS: 25.0000 mg | ORAL_TABLET | Freq: Two times a day (BID) | ORAL | 1 refills | Status: DC

## 2018-03-04 MED ORDER — ATORVASTATIN CALCIUM 40 MG PO TABS: 40.0000 mg | ORAL_TABLET | Freq: Every day | ORAL | 1 refills | Status: DC

## 2018-03-04 MED ORDER — CARVEDILOL 25 MG PO TABS: ORAL_TABLET | ORAL | 1 refills | Status: DC

## 2018-03-04 MED ORDER — OMEPRAZOLE 20 MG PO CPDR: 20.0000 mg | DELAYED_RELEASE_CAPSULE | Freq: Two times a day (BID) | ORAL | 3 refills | Status: DC

## 2018-03-04 MED ORDER — DONEPEZIL HCL 10 MG PO TABS: 10.0000 mg | ORAL_TABLET | Freq: Every day | ORAL | 1 refills | Status: DC

## 2018-03-04 NOTE — Progress Notes (Signed)
Subjective:  Patient ID: [place], female    DOB: Oct 18, 1935  Age: 83 y.o. MRN: [id number]  CC: Diabetes and Hypertension   HPI Caroline Cooper is an 83 year old female with a history of hypertension, type 2 diabetes mellitus (A1c 5.8), depression, overactive bladder who comes in today for a follow-up visit accompanied by her daughter.  Her diabetes is diet controlled and she denies hypoglycemia, numbness in extremities, visual concerns and is pretty active around the house. She tolerates her statin and denies myalgias with it or other adverse effects and is compliant with her antihypertensive. She has history low-cholesterol, low-sodium, diabetic diet. Also takes Aricept for dementia and does not endorse worsening of memory problems. She has no acute concerns today.  Past Medical History:  Diagnosis Date  . Diabetes mellitus without complication ([place])   . Diverticulitis   . DM (diabetes mellitus) ([place]) 09/24/2015  . HTN (hypertension) 09/24/2015  . Hyperlipidemia 09/24/2015  . Hypertension     Past Surgical History:  Procedure Laterality Date  . ABDOMINAL HYSTERECTOMY    . CHOLECYSTECTOMY    . COLON SURGERY     COLECTOMY FOR DIVERTICULITIS  . HERNIA REPAIR      Allergies  Allergen Reactions  . Penicillins Rash     Has patient had a PCN reaction causing immediate rash, facial/tongue/throat swelling, SOB or lightheadedness with hypotension: No Has patient had a PCN reaction causing severe rash involving mucus membranes or skin necrosis: No Has patient had a PCN reaction that required hospitalization No Has patient had a PCN reaction occurring within the last 10 years: No If all of the above answers are "NO", then may proceed with Cephalosporin use.      Outpatient Medications Prior to Visit  Medication Sig Dispense Refill  . acetaminophen (TYLENOL) 500 MG tablet Take 1,000 mg by mouth every 8 (eight) hours as needed for moderate pain or  headache.    Marland Kitchen atorvastatin (LIPITOR) 40 MG tablet Take 1 tablet (40 mg total) by mouth daily. 90 tablet 1  . carvedilol (COREG) 25 MG tablet TAKE 1 TABLET BY MOUTH TWICE DAILY WITH MEAL 180 tablet 1  . donepezil (ARICEPT) 10 MG tablet Take 1 tablet (10 mg total) by mouth at bedtime. 30 tablet 3  . hydrALAZINE (APRESOLINE) 25 MG tablet TAKE 1 TABLET BY MOUTH TWICE DAILY 180 tablet 0  . losartan (COZAAR) 100 MG tablet Take 1 tablet (100 mg total) by mouth daily. 90 tablet 0  . losartan (COZAAR) 100 MG tablet TAKE 1 TABLET BY MOUTH DAILY 90 tablet 0  . omeprazole (PRILOSEC) 20 MG capsule TAKE ONE CAPSULE BY MOUTH TWICE DAILY BEFORE MEALS 60 capsule 0  . olopatadine (PATANOL) 0.1 % ophthalmic solution Place 1 drop into both eyes 2 (two) times daily. (Patient not taking: Reported on 11/15/2017) 5 mL 2   No facility-administered medications prior to visit.     ROS Review of Systems  Constitutional: Negative for activity change, appetite change and fatigue.  HENT: Negative for congestion, sinus pressure and sore throat.   Eyes: Negative for visual disturbance.  Respiratory: Negative for cough, chest tightness, shortness of breath and wheezing.   Cardiovascular: Negative for chest pain and palpitations.  Gastrointestinal: Negative for abdominal distention, abdominal pain and constipation.  Endocrine: Negative for polydipsia.  Genitourinary: Negative for dysuria and frequency.  Musculoskeletal: Negative for arthralgias and back pain.  Skin: Negative for rash.  Neurological: Negative for tremors, light-headedness and numbness.  Hematological: Does not  bruise/bleed easily.  Psychiatric/Behavioral: Negative for agitation and behavioral problems.    Objective:  BP 135/68   Pulse 66   Temp (!) 97.3 F (36.3 C) (Oral)   Ht 5\' 4"  (1.626 m)   Wt 153 lb (69.4 kg)   SpO2 97%   BMI 26.26 kg/m   BP/Weight 03/04/2018 12/03/2017 76/16/0737  Systolic BP 106 269 485  Diastolic BP 68 70 59  Wt. (Lbs)  153 147.6 154.1  BMI 26.26 25.34 27.3      Physical Exam  Constitutional: She is oriented to person, place, and time. She appears well-developed and well-nourished.  Cardiovascular: Normal rate, normal heart sounds and intact distal pulses.  No murmur heard. Pulmonary/Chest: Effort normal and breath sounds normal. She has no wheezes. She has no rales. She exhibits no tenderness.  Abdominal: Soft. Bowel sounds are normal. She exhibits no distension and no mass. There is no tenderness.  Musculoskeletal: Normal range of motion.  Neurological: She is alert and oriented to person, place, and time.  Skin: Skin is warm and dry.  Psychiatric: She has a normal mood and affect.    CMP Latest Ref Rng & Units 12/03/2017 11/17/2017 11/16/2017  Glucose 65 - 99 mg/dL 93 98 118(H)  BUN 8 - 27 mg/dL 19 24(H) 28(H)  Creatinine 0.57 - 1.00 mg/dL 1.19(H) 1.00 1.20(H)  Sodium 134 - 144 mmol/L 141 132(L) 136  Potassium 3.5 - 5.2 mmol/L 4.1 3.9 3.4(L)  Chloride 96 - 106 mmol/L 97 102 104  CO2 20 - 29 mmol/L 24 23 24   Calcium 8.7 - 10.3 mg/dL 8.8 8.0(L) 7.9(L)  Total Protein 6.5 - 8.1 g/dL - - -  Total Bilirubin 0.3 - 1.2 mg/dL - - -  Alkaline Phos 38 - 126 U/L - - -  AST 15 - 41 U/L - - -  ALT 14 - 54 U/L - - -    Lipid Panel     Component Value Date/Time   CHOL 205 (H) 09/10/2017 0913   TRIG 177 (H) 09/10/2017 0913   HDL 63 09/10/2017 0913   CHOLHDL 3.3 09/10/2017 0913   CHOLHDL 3.0 10/03/2016 0859   VLDL 26 10/03/2016 0859   LDLCALC 107 (H) 09/10/2017 0913    Lab Results  Component Value Date   HGBA1C 5.8 03/04/2018    Assessment & Plan:   1. Diabetes mellitus without complication ([place]) Controlled with A1c of 5.8 Continue diabetic diet, lifestyle modifications - POCT glucose (manual entry) - POCT glycosylated hemoglobin (Hb A1C)  2. Pure hypercholesterolemia Controlled Low-cholesterol diet - atorvastatin (LIPITOR) 40 MG tablet; Take 1 tablet (40 mg total) by mouth daily.   Dispense: 90 tablet; Refill: 1  3. Essential hypertension Controlled Low-sodium diet - carvedilol (COREG) 25 MG tablet; TAKE 1 TABLET BY MOUTH TWICE DAILY WITH MEAL  Dispense: 180 tablet; Refill: 1 - hydrALAZINE (APRESOLINE) 25 MG tablet; Take 1 tablet (25 mg total) by mouth 2 (two) times daily.  Dispense: 180 tablet; Refill: 1 - losartan (COZAAR) 100 MG tablet; Take 1 tablet (100 mg total) by mouth daily.  Dispense: 90 tablet; Refill: 1  4. Memory loss Stable - donepezil (ARICEPT) 10 MG tablet; Take 1 tablet (10 mg total) by mouth at bedtime.  Dispense: 90 tablet; Refill: 1  Healthcare maintenance Declines referral to ophthalmology, bone density and declines receiving PCV 13  Meds ordered this encounter  Medications  . atorvastatin (LIPITOR) 40 MG tablet    Sig: Take 1 tablet (40 mg total) by mouth daily.  Dispense:  90 tablet    Refill:  1  . carvedilol (COREG) 25 MG tablet    Sig: TAKE 1 TABLET BY MOUTH TWICE DAILY WITH MEAL    Dispense:  180 tablet    Refill:  1  . donepezil (ARICEPT) 10 MG tablet    Sig: Take 1 tablet (10 mg total) by mouth at bedtime.    Dispense:  90 tablet    Refill:  1  . hydrALAZINE (APRESOLINE) 25 MG tablet    Sig: Take 1 tablet (25 mg total) by mouth 2 (two) times daily.    Dispense:  180 tablet    Refill:  1  . losartan (COZAAR) 100 MG tablet    Sig: Take 1 tablet (100 mg total) by mouth daily.    Dispense:  90 tablet    Refill:  1  . omeprazole (PRILOSEC) 20 MG capsule    Sig: Take 1 capsule (20 mg total) by mouth 2 (two) times daily before a meal.    Dispense:  60 capsule    Refill:  3    Follow-up: Return in about 3 months (around 06/03/2018) for follow up of [place] and Diabetes.   Charlott Rakes MD

## 2018-03-04 NOTE — Patient Instructions (Signed)
Diabetes Mellitus and Nutrition When you have diabetes (diabetes mellitus), it is very important to have healthy eating habits because your blood sugar (glucose) levels are greatly affected by what you eat and drink. Eating healthy foods in the appropriate amounts, at about the same times every day, can help you:  Control your blood glucose.  Lower your risk of heart disease.  Improve your blood pressure.  Reach or maintain a healthy weight.  Every person with diabetes is different, and each person has different needs for a meal plan. Your health care provider may recommend that you work with a diet and nutrition specialist (dietitian) to make a meal plan that is best for you. Your meal plan may vary depending on factors such as:  The calories you need.  The medicines you take.  Your weight.  Your blood glucose, blood pressure, and cholesterol levels.  Your activity level.  Other health conditions you have, such as heart or kidney disease.  How do carbohydrates affect me? Carbohydrates affect your blood glucose level more than any other type of food. Eating carbohydrates naturally increases the amount of glucose in your blood. Carbohydrate counting is a method for keeping track of how many carbohydrates you eat. Counting carbohydrates is important to keep your blood glucose at a healthy level, especially if you use insulin or take certain oral diabetes medicines. It is important to know how many carbohydrates you can safely have in each meal. This is different for every person. Your dietitian can help you calculate how many carbohydrates you should have at each meal and for snack. Foods that contain carbohydrates include:  Bread, cereal, rice, pasta, and crackers.  Potatoes and corn.  Peas, beans, and lentils.  Milk and yogurt.  Fruit and juice.  Desserts, such as cakes, cookies, ice cream, and candy.  How does alcohol affect me? Alcohol can cause a sudden decrease in blood  glucose (hypoglycemia), especially if you use insulin or take certain oral diabetes medicines. Hypoglycemia can be a life-threatening condition. Symptoms of hypoglycemia (sleepiness, dizziness, and confusion) are similar to symptoms of having too much alcohol. If your health care provider says that alcohol is safe for you, follow these guidelines:  Limit alcohol intake to no more than 1 drink per day for nonpregnant women and 2 drinks per day for men. One drink equals 12 oz of beer, 5 oz of wine, or 1 oz of hard liquor.  Do not drink on an empty stomach.  Keep yourself hydrated with water, diet soda, or unsweetened iced tea.  Keep in mind that regular soda, juice, and other mixers may contain a lot of sugar and must be counted as carbohydrates.  What are tips for following this plan? Reading food labels  Start by checking the serving size on the label. The amount of calories, carbohydrates, fats, and other nutrients listed on the label are based on one serving of the food. Many foods contain more than one serving per package.  Check the total grams (g) of carbohydrates in one serving. You can calculate the number of servings of carbohydrates in one serving by dividing the total carbohydrates by 15. For example, if a food has 30 g of total carbohydrates, it would be equal to 2 servings of carbohydrates.  Check the number of grams (g) of saturated and trans fats in one serving. Choose foods that have low or no amount of these fats.  Check the number of milligrams (mg) of sodium in one serving. Most people   should limit total sodium intake to less than 2,300 mg per day.  Always check the nutrition information of foods labeled as "low-fat" or "nonfat". These foods may be higher in added sugar or refined carbohydrates and should be avoided.  Talk to your dietitian to identify your daily goals for nutrients listed on the label. Shopping  Avoid buying canned, premade, or processed foods. These  foods tend to be high in fat, sodium, and added sugar.  Shop around the outside edge of the grocery store. This includes fresh fruits and vegetables, bulk grains, fresh meats, and fresh dairy. Cooking  Use low-heat cooking methods, such as baking, instead of high-heat cooking methods like deep frying.  Cook using healthy oils, such as olive, canola, or sunflower oil.  Avoid cooking with butter, cream, or high-fat meats. Meal planning  Eat meals and snacks regularly, preferably at the same times every day. Avoid going long periods of time without eating.  Eat foods high in fiber, such as fresh fruits, vegetables, beans, and whole grains. Talk to your dietitian about how many servings of carbohydrates you can eat at each meal.  Eat 4-6 ounces of lean protein each day, such as lean meat, chicken, fish, eggs, or tofu. 1 ounce is equal to 1 ounce of meat, chicken, or fish, 1 egg, or 1/4 cup of tofu.  Eat some foods each day that contain healthy fats, such as avocado, nuts, seeds, and fish. Lifestyle   Check your blood glucose regularly.  Exercise at least 30 minutes 5 or more days each week, or as told by your health care provider.  Take medicines as told by your health care provider.  Do not use any products that contain nicotine or tobacco, such as cigarettes and e-cigarettes. If you need help quitting, ask your health care provider.  Work with a counselor or diabetes educator to identify strategies to manage stress and any emotional and social challenges. What are some questions to ask my health care provider?  Do I need to meet with a diabetes educator?  Do I need to meet with a dietitian?  What number can I call if I have questions?  When are the best times to check my blood glucose? Where to find more information:  American Diabetes Association: diabetes.org/food-and-fitness/food  Academy of Nutrition and Dietetics:  www.eatright.org/resources/health/diseases-and-conditions/diabetes  National Institute of Diabetes and Digestive and Kidney Diseases (NIH): www.niddk.nih.gov/health-information/diabetes/overview/diet-eating-physical-activity Summary  A healthy meal plan will help you control your blood glucose and maintain a healthy lifestyle.  Working with a diet and nutrition specialist (dietitian) can help you make a meal plan that is best for you.  Keep in mind that carbohydrates and alcohol have immediate effects on your blood glucose levels. It is important to count carbohydrates and to use alcohol carefully. This information is not intended to replace advice given to you by your health care provider. Make sure you discuss any questions you have with your health care provider. Document Released: 08/15/2005 Document Revised: 12/23/2016 Document Reviewed: 12/23/2016 Elsevier Interactive Patient Education  2018 Elsevier Inc.  

## 2018-03-05 ENCOUNTER — Encounter: Payer: Self-pay | Admitting: Family Medicine

## 2018-04-03 ENCOUNTER — Other Ambulatory Visit: Payer: Self-pay | Admitting: Family Medicine

## 2018-04-03 DIAGNOSIS — I10 Essential (primary) hypertension: Secondary | ICD-10-CM

## 2018-06-03 ENCOUNTER — Encounter: Payer: Self-pay | Admitting: Family Medicine

## 2018-06-03 ENCOUNTER — Ambulatory Visit: Payer: Medicare Other | Attending: Family Medicine | Admitting: Family Medicine

## 2018-06-03 VITALS — BP 108/62 | HR 67 | Temp 98.1°F | Ht 64.0 in | Wt 155.6 lb

## 2018-06-03 DIAGNOSIS — I10 Essential (primary) hypertension: Secondary | ICD-10-CM | POA: Diagnosis not present

## 2018-06-03 DIAGNOSIS — E119 Type 2 diabetes mellitus without complications: Secondary | ICD-10-CM | POA: Insufficient documentation

## 2018-06-03 DIAGNOSIS — N390 Urinary tract infection, site not specified: Secondary | ICD-10-CM | POA: Diagnosis not present

## 2018-06-03 DIAGNOSIS — E2839 Other primary ovarian failure: Secondary | ICD-10-CM | POA: Diagnosis not present

## 2018-06-03 DIAGNOSIS — F329 Major depressive disorder, single episode, unspecified: Secondary | ICD-10-CM | POA: Diagnosis not present

## 2018-06-03 DIAGNOSIS — Z880 Allergy status to penicillin: Secondary | ICD-10-CM | POA: Diagnosis not present

## 2018-06-03 DIAGNOSIS — E7849 Other hyperlipidemia: Secondary | ICD-10-CM | POA: Diagnosis not present

## 2018-06-03 DIAGNOSIS — Z79899 Other long term (current) drug therapy: Secondary | ICD-10-CM | POA: Insufficient documentation

## 2018-06-03 DIAGNOSIS — Z9049 Acquired absence of other specified parts of digestive tract: Secondary | ICD-10-CM | POA: Diagnosis not present

## 2018-06-03 DIAGNOSIS — N3281 Overactive bladder: Secondary | ICD-10-CM | POA: Diagnosis not present

## 2018-06-03 DIAGNOSIS — N3000 Acute cystitis without hematuria: Secondary | ICD-10-CM

## 2018-06-03 LAB — POCT URINALYSIS DIPSTICK
Blood, UA: NEGATIVE
Glucose, UA: NEGATIVE
Ketones, UA: 15
NITRITE UA: NEGATIVE
PH UA: 5.5 (ref 5.0–8.0)
PROTEIN UA: POSITIVE — AB
Spec Grav, UA: 1.02 (ref 1.010–1.025)
UROBILINOGEN UA: 1 U/dL

## 2018-06-03 LAB — POCT GLYCOSYLATED HEMOGLOBIN (HGB A1C): HBA1C, POC (CONTROLLED DIABETIC RANGE): 6.1 % (ref 0.0–7.0)

## 2018-06-03 LAB — GLUCOSE, POCT (MANUAL RESULT ENTRY): POC GLUCOSE: 117 mg/dL — AB (ref 70–99)

## 2018-06-03 MED ORDER — SULFAMETHOXAZOLE-TRIMETHOPRIM 800-160 MG PO TABS: 1.0000 | ORAL_TABLET | Freq: Two times a day (BID) | ORAL | 0 refills | Status: DC

## 2018-06-03 MED ORDER — OXYBUTYNIN CHLORIDE 5 MG PO TABS: 5.0000 mg | ORAL_TABLET | Freq: Two times a day (BID) | ORAL | 3 refills | Status: DC

## 2018-06-03 NOTE — Patient Instructions (Signed)

## 2018-06-03 NOTE — Progress Notes (Signed)
Subjective:  Patient ID: Caroline Cooper, female    DOB: Sep 01, 1935  Age: 83 y.o. MRN: [id number]  CC: Diabetes   HPI Kaziyah Parkison Colon [name]  is an 83 year old female with a history of hypertension, type 2 diabetes mellitus (A1c 6.1), depression, overactive bladder who comes in today for a follow-up visit accompanied by her daughter. She complains of urinary frequency and nocturia but denies dysuria, flank pain, fever, nausea or vomiting.  She also describes some form of urge incontinence. With regards to her diabetes mellitus she denies hypoglycemia and is up-to-date on her annual eye exams.  Denies numbness in extremities and her diabetes is diet controlled.  Her major form of exercise is walking. Doing well on her antihypertensive and her statin and denies adverse effects from it.  She did have some depression a couple of months back but refuse initiation of medication and reports doing well now. She has no additional concerns today.  Past Medical History:  Diagnosis Date  . Diabetes mellitus without complication ([place])   . Diverticulitis   . DM (diabetes mellitus) ([place]) 09/24/2015  . HTN (hypertension) 09/24/2015  . Hyperlipidemia 09/24/2015  . Hypertension     Past Surgical History:  Procedure Laterality Date  . ABDOMINAL HYSTERECTOMY    . CHOLECYSTECTOMY    . COLON SURGERY     COLECTOMY FOR DIVERTICULITIS  . HERNIA REPAIR      Allergies  Allergen Reactions  . Penicillins Rash     Has patient had a PCN reaction causing immediate rash, facial/tongue/throat swelling, SOB or lightheadedness with hypotension: No Has patient had a PCN reaction causing severe rash involving mucus membranes or skin necrosis: No Has patient had a PCN reaction that required hospitalization No Has patient had a PCN reaction occurring within the last 10 years: No If all of the above answers are "NO", then may proceed with Cephalosporin use.      Outpatient Medications Prior to  Visit  Medication Sig Dispense Refill  . acetaminophen (TYLENOL) 500 MG tablet Take 1,000 mg by mouth every 8 (eight) hours as needed for moderate pain or headache.    Marland Kitchen atorvastatin (LIPITOR) 40 MG tablet Take 1 tablet (40 mg total) by mouth daily. 90 tablet 1  . carvedilol (COREG) 25 MG tablet TAKE 1 TABLET BY MOUTH TWICE DAILY WITH MEAL 180 tablet 1  . carvedilol (COREG) 25 MG tablet TAKE 1 TABLET BY MOUTH TWICE DAILY WITH A MEAL 180 tablet 0  . donepezil (ARICEPT) 10 MG tablet Take 1 tablet (10 mg total) by mouth at bedtime. 90 tablet 1  . hydrALAZINE (APRESOLINE) 25 MG tablet Take 1 tablet (25 mg total) by mouth 2 (two) times daily. 180 tablet 1  . losartan (COZAAR) 100 MG tablet Take 1 tablet (100 mg total) by mouth daily. 90 tablet 1  . losartan (COZAAR) 100 MG tablet TAKE 1 TABLET BY MOUTH DAILY 90 tablet 0  . olopatadine (PATANOL) 0.1 % ophthalmic solution Place 1 drop into both eyes 2 (two) times daily. 5 mL 2  . omeprazole (PRILOSEC) 20 MG capsule Take 1 capsule (20 mg total) by mouth 2 (two) times daily before a meal. (Patient not taking: Reported on 06/03/2018) 60 capsule 3   No facility-administered medications prior to visit.     ROS Review of Systems  Constitutional: Negative for activity change, appetite change and fatigue.  HENT: Negative for congestion, sinus pressure and sore throat.   Eyes: Negative for visual disturbance.  Respiratory: Negative  for cough, chest tightness, shortness of breath and wheezing.   Cardiovascular: Negative for chest pain and palpitations.  Gastrointestinal: Negative for abdominal distention, abdominal pain and constipation.  Endocrine: Negative for polydipsia.  Genitourinary: Positive for frequency. Negative for dysuria and flank pain.  Musculoskeletal: Negative for arthralgias and back pain.  Skin: Negative for rash.  Neurological: Negative for tremors, light-headedness and numbness.  Hematological: Does not bruise/bleed easily.    Psychiatric/Behavioral: Negative for agitation and behavioral problems.    Objective:  BP 108/62   Pulse 67   Temp 98.1 F (36.7 C) (Oral)   Ht 5\' 4"  (1.626 m)   Wt 155 lb 9.6 oz (70.6 kg)   SpO2 97%   BMI 26.71 kg/m   BP/Weight 06/03/2018 06/01/2457 0/08/9832  Systolic BP 825 053 976  Diastolic BP 62 68 70  Wt. (Lbs) 155.6 153 147.6  BMI 26.71 26.26 25.34      Physical Exam  Constitutional: She is oriented to person, place, and time. She appears well-developed and well-nourished.  Cardiovascular: Normal rate, normal heart sounds and intact distal pulses.  No murmur heard. Pulmonary/Chest: Effort normal and breath sounds normal. She has no wheezes. She has no rales. She exhibits no tenderness.  Abdominal: Soft. Bowel sounds are normal. She exhibits no distension and no mass. There is no tenderness.  Musculoskeletal: Normal range of motion.  Neurological: She is alert and oriented to person, place, and time.  Skin: Skin is warm and dry.  Psychiatric: She has a normal mood and affect.     CMP Latest Ref Rng & Units 12/03/2017 11/17/2017 11/16/2017  Glucose 65 - 99 mg/dL 93 98 118(H)  BUN 8 - 27 mg/dL 19 24(H) 28(H)  Creatinine 0.57 - 1.00 mg/dL 1.19(H) 1.00 1.20(H)  Sodium 134 - 144 mmol/L 141 132(L) 136  Potassium 3.5 - 5.2 mmol/L 4.1 3.9 3.4(L)  Chloride 96 - 106 mmol/L 97 102 104  CO2 20 - 29 mmol/L 24 23 24   Calcium 8.7 - 10.3 mg/dL 8.8 8.0(L) 7.9(L)  Total Protein 6.5 - 8.1 g/dL - - -  Total Bilirubin 0.3 - 1.2 mg/dL - - -  Alkaline Phos 38 - 126 U/L - - -  AST 15 - 41 U/L - - -  ALT 14 - 54 U/L - - -    Lipid Panel     Component Value Date/Time   CHOL 205 (H) 09/10/2017 0913   TRIG 177 (H) 09/10/2017 0913   HDL 63 09/10/2017 0913   CHOLHDL 3.3 09/10/2017 0913   CHOLHDL 3.0 10/03/2016 0859   VLDL 26 10/03/2016 0859   LDLCALC 107 (H) 09/10/2017 0913    Lab Results  Component Value Date   HGBA1C 6.1 06/03/2018    Assessment & Plan:   1. Type 2  diabetes, HbA1c goal < 7% ([facility]) Diet controlled with A1c of 6.1 Up-to-date on annual eye exam Counseled on Diabetic diet, my plate method, 734 minutes of moderate intensity exercise/week Keep blood sugar logs with fasting goals of 80-120 mg/dl, random of less than 180 and in the event of sugars less than 60 mg/dl or greater than 400 mg/dl please notify the clinic ASAP. It is recommended that you undergo annual eye exams and annual foot exams. Pneumonia vaccine is recommended.  - POCT glucose (manual entry) - POCT glycosylated hemoglobin (Hb A1C) - Microalbumin/Creatinine Ratio, Urine  2. Other hyperlipidemia Controlled Low-cholesterol diet  3. Essential hypertension Controlled Counseled on blood pressure goal of less than 130/80, low-sodium, DASH diet,  medication compliance, 150 minutes of moderate intensity exercise per week. Discussed medication compliance, adverse effects. Continue antihypertensives  4. Overactive bladder Placed on oxybutynin  5. Estrogen deficiency - DG Bone Density; Future  6.  UTI Treated We will send off urine culture  Meds ordered this encounter  Medications  . oxybutynin (DITROPAN) 5 MG tablet    Sig: Take 1 tablet (5 mg total) by mouth 2 (two) times daily.    Dispense:  60 tablet    Refill:  3    Follow-up: Return in about 3 months (around 09/03/2018) for Follow up of chronic medical conditions.   Charlott Rakes MD

## 2018-06-04 LAB — MICROALBUMIN / CREATININE URINE RATIO
Creatinine, Urine: 247.1 mg/dL
Microalb/Creat Ratio: 10.6 mg/g creat (ref 0.0–30.0)
Microalbumin, Urine: 26.2 ug/mL

## 2018-06-05 LAB — URINE CULTURE

## 2018-06-08 ENCOUNTER — Other Ambulatory Visit: Payer: Self-pay | Admitting: Family Medicine

## 2018-06-08 DIAGNOSIS — R413 Other amnesia: Secondary | ICD-10-CM

## 2018-06-09 ENCOUNTER — Other Ambulatory Visit: Payer: Self-pay | Admitting: Family Medicine

## 2018-06-09 DIAGNOSIS — R413 Other amnesia: Secondary | ICD-10-CM

## 2018-07-21 ENCOUNTER — Telehealth: Payer: Self-pay | Admitting: Family Medicine

## 2018-07-21 NOTE — Telephone Encounter (Signed)
Patient was called and infomred to make an OV.

## 2018-07-21 NOTE — Telephone Encounter (Signed)
Patient son called regarding mothers mental health. States she is becoming more verbally aggressive. Believes it is dementia.   Patient son would like a call back as soon as possible.

## 2018-09-03 ENCOUNTER — Encounter: Payer: Self-pay | Admitting: Family Medicine

## 2018-09-03 ENCOUNTER — Ambulatory Visit: Payer: Medicare Other | Attending: Family Medicine | Admitting: Family Medicine

## 2018-09-03 VITALS — BP 167/76 | HR 51 | Temp 98.1°F | Ht 64.0 in | Wt 153.6 lb

## 2018-09-03 DIAGNOSIS — R413 Other amnesia: Secondary | ICD-10-CM | POA: Diagnosis not present

## 2018-09-03 DIAGNOSIS — E119 Type 2 diabetes mellitus without complications: Secondary | ICD-10-CM | POA: Diagnosis not present

## 2018-09-03 DIAGNOSIS — I10 Essential (primary) hypertension: Secondary | ICD-10-CM

## 2018-09-03 DIAGNOSIS — Z79899 Other long term (current) drug therapy: Secondary | ICD-10-CM | POA: Diagnosis not present

## 2018-09-03 DIAGNOSIS — E785 Hyperlipidemia, unspecified: Secondary | ICD-10-CM | POA: Insufficient documentation

## 2018-09-03 DIAGNOSIS — E7800 Pure hypercholesterolemia, unspecified: Secondary | ICD-10-CM | POA: Diagnosis not present

## 2018-09-03 DIAGNOSIS — N3000 Acute cystitis without hematuria: Secondary | ICD-10-CM | POA: Diagnosis not present

## 2018-09-03 DIAGNOSIS — F329 Major depressive disorder, single episode, unspecified: Secondary | ICD-10-CM | POA: Diagnosis not present

## 2018-09-03 DIAGNOSIS — N3281 Overactive bladder: Secondary | ICD-10-CM | POA: Insufficient documentation

## 2018-09-03 LAB — POCT GLYCOSYLATED HEMOGLOBIN (HGB A1C): HBA1C, POC (CONTROLLED DIABETIC RANGE): 5.8 % (ref 0.0–7.0)

## 2018-09-03 LAB — POCT URINALYSIS DIP (CLINITEK)
Bilirubin, UA: NEGATIVE
GLUCOSE UA: NEGATIVE mg/dL
Ketones, POC UA: NEGATIVE mg/dL
NITRITE UA: POSITIVE — AB
PH UA: 6 (ref 5.0–8.0)
SPEC GRAV UA: 1.02 (ref 1.010–1.025)
UROBILINOGEN UA: 0.2 U/dL

## 2018-09-03 LAB — GLUCOSE, POCT (MANUAL RESULT ENTRY): POC GLUCOSE: 95 mg/dL (ref 70–99)

## 2018-09-03 MED ORDER — ATORVASTATIN CALCIUM 40 MG PO TABS: 40.0000 mg | ORAL_TABLET | Freq: Every day | ORAL | 1 refills | Status: DC

## 2018-09-03 MED ORDER — CARVEDILOL 25 MG PO TABS: ORAL_TABLET | ORAL | 1 refills | Status: DC

## 2018-09-03 MED ORDER — DONEPEZIL HCL 10 MG PO TABS: 10.0000 mg | ORAL_TABLET | Freq: Every day | ORAL | 1 refills | Status: DC

## 2018-09-03 MED ORDER — HYDRALAZINE HCL 25 MG PO TABS: 25.0000 mg | ORAL_TABLET | Freq: Two times a day (BID) | ORAL | 1 refills | Status: DC

## 2018-09-03 MED ORDER — SULFAMETHOXAZOLE-TRIMETHOPRIM 800-160 MG PO TABS: 1.0000 | ORAL_TABLET | Freq: Two times a day (BID) | ORAL | 0 refills | Status: DC

## 2018-09-03 MED ORDER — LOSARTAN POTASSIUM 100 MG PO TABS: 100.0000 mg | ORAL_TABLET | Freq: Every day | ORAL | 1 refills | Status: DC

## 2018-09-03 NOTE — Progress Notes (Signed)
Subjective:  Patient ID: [place], female    DOB: 12/25/1934  Age: 83 y.o. MRN: [id number]  CC: Diabetes   HPI Caroline Cooper   is an 83 year old female with a history of hypertension, type 2 diabetes mellitus (A1c 5.8), depression,early dementia, overactive bladder who comes in today for a follow-up visit accompanied by her daughter. She informs me she feels fine and has enough energy and stays active at home with her activities of daily living.  She does not walk regularly but states she gets exercise at home.  She lives with her 26 year old husband and has her children close by who coming to check on her. For her diabetes mellitus she is on diet control and denies hypoglycemia, numbness in extremities; she is unsure of her last eye exam. Her blood pressure is elevated due to the fact that she is here to take her antihypertensives as she is fasting in anticipation of labs. She has been compliant with Aricept for her memory loss and her daughter informs me her dad had mentioned the patient getting anxious and on edge on certain instances (like when the power went out) but failed to explain further.  At previous visits she had declined initiation of antidepressants and denies being depressed or anxious. Currently does not take any medications for her overactive bladder.  Past Medical History:  Diagnosis Date  . Diabetes mellitus without complication ([place])   . Diverticulitis   . DM (diabetes mellitus) ([place]) 09/24/2015  . HTN (hypertension) 09/24/2015  . Hyperlipidemia 09/24/2015  . Hypertension     Past Surgical History:  Procedure Laterality Date  . ABDOMINAL HYSTERECTOMY    . CHOLECYSTECTOMY    . COLON SURGERY     COLECTOMY FOR DIVERTICULITIS  . HERNIA REPAIR      Allergies  Allergen Reactions  . Penicillins Rash     Has patient had a PCN reaction causing immediate rash, facial/tongue/throat swelling, SOB or lightheadedness with hypotension: No Has  patient had a PCN reaction causing severe rash involving mucus membranes or skin necrosis: No Has patient had a PCN reaction that required hospitalization No Has patient had a PCN reaction occurring within the last 10 years: No If all of the above answers are "NO", then may proceed with Cephalosporin use.      Outpatient Medications Prior to Visit  Medication Sig Dispense Refill  . atorvastatin (LIPITOR) 40 MG tablet Take 1 tablet (40 mg total) by mouth daily. 90 tablet 1  . carvedilol (COREG) 25 MG tablet TAKE 1 TABLET BY MOUTH TWICE DAILY WITH MEAL 180 tablet 1  . donepezil (ARICEPT) 10 MG tablet Take 1 tablet (10 mg total) by mouth at bedtime. 90 tablet 1  . hydrALAZINE (APRESOLINE) 25 MG tablet Take 1 tablet (25 mg total) by mouth 2 (two) times daily. 180 tablet 1  . losartan (COZAAR) 100 MG tablet Take 1 tablet (100 mg total) by mouth daily. 90 tablet 1  . acetaminophen (TYLENOL) 500 MG tablet Take 1,000 mg by mouth every 8 (eight) hours as needed for moderate pain or headache.    . olopatadine (PATANOL) 0.1 % ophthalmic solution Place 1 drop into both eyes 2 (two) times daily. (Patient not taking: Reported on 09/03/2018) 5 mL 2  . oxybutynin (DITROPAN) 5 MG tablet Take 1 tablet (5 mg total) by mouth 2 (two) times daily. (Patient not taking: Reported on 09/03/2018) 60 tablet 3  . carvedilol (COREG) 25 MG tablet TAKE 1 TABLET BY MOUTH  TWICE DAILY WITH A MEAL (Patient not taking: Reported on 09/03/2018) 180 tablet 0  . donepezil (ARICEPT) 10 MG tablet TAKE 1 TABLET BY MOUTH EVERY NIGHT AT BEDTIME (Patient not taking: Reported on 09/03/2018) 30 tablet 0  . losartan (COZAAR) 100 MG tablet TAKE 1 TABLET BY MOUTH DAILY (Patient not taking: Reported on 09/03/2018) 90 tablet 0  . sulfamethoxazole-trimethoprim (BACTRIM DS,SEPTRA DS) 800-160 MG tablet Take 1 tablet by mouth 2 (two) times daily. (Patient not taking: Reported on 09/03/2018) 6 tablet 0   No facility-administered medications prior to visit.      ROS Review of Systems  Constitutional: Negative for activity change, appetite change and fatigue.  HENT: Negative for congestion, sinus pressure and sore throat.   Eyes: Negative for visual disturbance.  Respiratory: Negative for cough, chest tightness, shortness of breath and wheezing.   Cardiovascular: Negative for chest pain and palpitations.  Gastrointestinal: Negative for abdominal distention, abdominal pain and constipation.  Endocrine: Negative for polydipsia.  Genitourinary: Negative for dysuria and frequency.  Musculoskeletal: Negative for arthralgias and back pain.  Skin: Negative for rash.  Neurological: Negative for tremors, light-headedness and numbness.  Hematological: Does not bruise/bleed easily.  Psychiatric/Behavioral: Negative for agitation and behavioral problems.    Objective:  BP (!) 167/76   Pulse (!) 51   Temp 98.1 F (36.7 C) (Oral)   Ht 5' 4"  (1.626 m)   Wt 153 lb 9.6 oz (69.7 kg)   SpO2 98%   BMI 26.37 kg/m   BP/Weight 09/03/2018 0/08/3817 01/10/9370  Systolic BP 696 789 381  Diastolic BP 76 62 68  Wt. (Lbs) 153.6 155.6 153  BMI 26.37 26.71 26.26      Physical Exam  Constitutional: She is oriented to person, place, and time. She appears well-developed and well-nourished.  HENT:  Right Ear: External ear normal.  Left Ear: External ear normal.  Mouth/Throat: Oropharynx is clear and moist.  Cardiovascular: Normal rate, normal heart sounds and intact distal pulses.  No murmur heard. Pulmonary/Chest: Effort normal and breath sounds normal. She has no wheezes. She has no rales. She exhibits no tenderness.  Abdominal: Soft. Bowel sounds are normal. She exhibits no distension and no mass. There is no tenderness.  Musculoskeletal: Normal range of motion.  Neurological: She is alert and oriented to person, place, and time.  Skin: Skin is warm and dry.  Psychiatric: She has a normal mood and affect.    CMP Latest Ref Rng & Units 12/03/2017  11/17/2017 11/16/2017  Glucose 65 - 99 mg/dL 93 98 118(H)  BUN 8 - 27 mg/dL 19 24(H) 28(H)  Creatinine 0.57 - 1.00 mg/dL 1.19(H) 1.00 1.20(H)  Sodium 134 - 144 mmol/L 141 132(L) 136  Potassium 3.5 - 5.2 mmol/L 4.1 3.9 3.4(L)  Chloride 96 - 106 mmol/L 97 102 104  CO2 20 - 29 mmol/L 24 23 24   Calcium 8.7 - 10.3 mg/dL 8.8 8.0(L) 7.9(L)  Total Protein 6.5 - 8.1 g/dL - - -  Total Bilirubin 0.3 - 1.2 mg/dL - - -  Alkaline Phos 38 - 126 U/L - - -  AST 15 - 41 U/L - - -  ALT 14 - 54 U/L - - -    Lipid Panel     Component Value Date/Time   CHOL 205 (H) 09/10/2017 0913   TRIG 177 (H) 09/10/2017 0913   HDL 63 09/10/2017 0913   CHOLHDL 3.3 09/10/2017 0913   CHOLHDL 3.0 10/03/2016 0859   VLDL 26 10/03/2016 0859   LDLCALC  107 (H) 09/10/2017 0913    Lab Results  Component Value Date   HGBA1C 5.8 09/03/2018    Assessment & Plan:   1. Type 2 diabetes, HbA1c goal < 7% (HCC) Diet controlled with A1c of 5.8 Counseled on Diabetic diet, my plate method, 185 minutes of moderate intensity exercise/week Keep blood sugar logs with fasting goals of 80-120 mg/dl, random of less than 180 and in the event of sugars less than 60 mg/dl or greater than 400 mg/dl please notify the clinic ASAP. It is recommended that you undergo annual eye exams and annual foot exams. Pneumonia vaccine is recommended. - POCT glucose (manual entry) - POCT glycosylated hemoglobin (Hb A1C) - Lipid panel - CMP14+EGFR - Microalbumin/Creatinine Ratio, Urine  2. Acute cystitis without hematuria Asymptomatic We will treat given she is a senior and is prone to UTIs with a previous history of metabolic encephalopathy secondary to UTI - Urine Culture - POCT URINALYSIS DIP (CLINITEK) - sulfamethoxazole-trimethoprim (BACTRIM DS,SEPTRA DS) 800-160 MG tablet; Take 1 tablet by mouth 2 (two) times daily.  Dispense: 14 tablet; Refill: 0  3. Essential hypertension Uncontrolled due to not taking medications today - losartan  (COZAAR) 100 MG tablet; Take 1 tablet (100 mg total) by mouth daily.  Dispense: 90 tablet; Refill: 1 - hydrALAZINE (APRESOLINE) 25 MG tablet; Take 1 tablet (25 mg total) by mouth 2 (two) times daily.  Dispense: 180 tablet; Refill: 1 - carvedilol (COREG) 25 MG tablet; TAKE 1 TABLET BY MOUTH TWICE DAILY WITH MEAL  Dispense: 180 tablet; Refill: 1  4. Memory loss Compliant with Aricept Unclear if she has had some behavioral disturbances as per daughter's history as this is just a suspicion and was not corroborated by the patient's husband Family will be more observant for any other signs - donepezil (ARICEPT) 10 MG tablet; Take 1 tablet (10 mg total) by mouth at bedtime.  Dispense: 90 tablet; Refill: 1  5. Pure hypercholesterolemia Controlled - atorvastatin (LIPITOR) 40 MG tablet; Take 1 tablet (40 mg total) by mouth daily.  Dispense: 90 tablet; Refill: 1   Meds ordered this encounter  Medications  . losartan (COZAAR) 100 MG tablet    Sig: Take 1 tablet (100 mg total) by mouth daily.    Dispense:  90 tablet    Refill:  1  . hydrALAZINE (APRESOLINE) 25 MG tablet    Sig: Take 1 tablet (25 mg total) by mouth 2 (two) times daily.    Dispense:  180 tablet    Refill:  1  . donepezil (ARICEPT) 10 MG tablet    Sig: Take 1 tablet (10 mg total) by mouth at bedtime.    Dispense:  90 tablet    Refill:  1  . carvedilol (COREG) 25 MG tablet    Sig: TAKE 1 TABLET BY MOUTH TWICE DAILY WITH MEAL    Dispense:  180 tablet    Refill:  1  . atorvastatin (LIPITOR) 40 MG tablet    Sig: Take 1 tablet (40 mg total) by mouth daily.    Dispense:  90 tablet    Refill:  1  . sulfamethoxazole-trimethoprim (BACTRIM DS,SEPTRA DS) 800-160 MG tablet    Sig: Take 1 tablet by mouth 2 (two) times daily.    Dispense:  14 tablet    Refill:  0    Follow-up: Return in about 6 months (around 03/05/2019) for Follow-up of chronic medical conditions.   Caroline Rakes MD

## 2018-09-04 LAB — CMP14+EGFR
ALBUMIN: 4.5 g/dL (ref 3.5–4.7)
ALK PHOS: 94 IU/L (ref 39–117)
ALT: 11 IU/L (ref 0–32)
AST: 17 IU/L (ref 0–40)
Albumin/Globulin Ratio: 1.7 (ref 1.2–2.2)
BILIRUBIN TOTAL: 0.7 mg/dL (ref 0.0–1.2)
BUN / CREAT RATIO: 19 (ref 12–28)
BUN: 19 mg/dL (ref 8–27)
CHLORIDE: 103 mmol/L (ref 96–106)
CO2: 25 mmol/L (ref 20–29)
Calcium: 9.3 mg/dL (ref 8.7–10.3)
Creatinine, Ser: 1.02 mg/dL — ABNORMAL HIGH (ref 0.57–1.00)
GFR calc Af Amer: 60 mL/min/{1.73_m2} (ref >59)
GFR calc non Af Amer: 52 mL/min/{1.73_m2} — ABNORMAL LOW (ref >59)
GLOBULIN, TOTAL: 2.7 g/dL (ref 1.5–4.5)
Glucose: 101 mg/dL — ABNORMAL HIGH (ref 65–99)
Potassium: 4.4 mmol/L (ref 3.5–5.2)
SODIUM: 144 mmol/L (ref 134–144)
Total Protein: 7.2 g/dL (ref 6.0–8.5)

## 2018-09-04 LAB — LIPID PANEL
CHOLESTEROL TOTAL: 140 mg/dL (ref 100–199)
Chol/HDL Ratio: 2.5 ratio (ref 0.0–4.4)
HDL: 56 mg/dL (ref >39)
LDL Calculated: 60 mg/dL (ref 0–99)
Triglycerides: 122 mg/dL (ref 0–149)
VLDL CHOLESTEROL CAL: 24 mg/dL (ref 5–40)

## 2018-09-04 LAB — MICROALBUMIN / CREATININE URINE RATIO
CREATININE, UR: 168 mg/dL
Microalb/Creat Ratio: 16.6 mg/g creat (ref 0.0–30.0)
Microalbumin, Urine: 27.9 ug/mL

## 2018-09-05 LAB — URINE CULTURE

## 2018-09-10 ENCOUNTER — Other Ambulatory Visit: Payer: Self-pay | Admitting: Pharmacist

## 2018-09-10 NOTE — Progress Notes (Signed)
Pt reports not taking oxybutynin. Rec alert from patient's insurance company regarding anticholinergics and dementia risk/hx. As patient reports not taking the oxybutynin, will remove this from her med-list.

## 2018-11-27 ENCOUNTER — Other Ambulatory Visit: Payer: Self-pay | Admitting: Family Medicine

## 2019-01-26 ENCOUNTER — Encounter: Payer: Self-pay | Admitting: Family Medicine

## 2019-01-26 ENCOUNTER — Ambulatory Visit: Payer: Medicare Other | Attending: Family Medicine | Admitting: Family Medicine

## 2019-01-26 VITALS — BP 177/76 | HR 62 | Temp 98.0°F | Ht 64.0 in | Wt 163.2 lb

## 2019-01-26 DIAGNOSIS — Z880 Allergy status to penicillin: Secondary | ICD-10-CM | POA: Diagnosis not present

## 2019-01-26 DIAGNOSIS — Z8249 Family history of ischemic heart disease and other diseases of the circulatory system: Secondary | ICD-10-CM | POA: Diagnosis not present

## 2019-01-26 DIAGNOSIS — M546 Pain in thoracic spine: Secondary | ICD-10-CM | POA: Diagnosis not present

## 2019-01-26 DIAGNOSIS — M25512 Pain in left shoulder: Secondary | ICD-10-CM

## 2019-01-26 DIAGNOSIS — I10 Essential (primary) hypertension: Secondary | ICD-10-CM | POA: Diagnosis not present

## 2019-01-26 DIAGNOSIS — E119 Type 2 diabetes mellitus without complications: Secondary | ICD-10-CM | POA: Insufficient documentation

## 2019-01-26 DIAGNOSIS — M25511 Pain in right shoulder: Secondary | ICD-10-CM | POA: Diagnosis not present

## 2019-01-26 DIAGNOSIS — E785 Hyperlipidemia, unspecified: Secondary | ICD-10-CM | POA: Diagnosis not present

## 2019-01-26 DIAGNOSIS — Z79899 Other long term (current) drug therapy: Secondary | ICD-10-CM | POA: Insufficient documentation

## 2019-01-26 LAB — POCT URINALYSIS DIP (CLINITEK)
BILIRUBIN UA: NEGATIVE
Glucose, UA: NEGATIVE mg/dL
Ketones, POC UA: NEGATIVE mg/dL
Leukocytes, UA: NEGATIVE
Nitrite, UA: NEGATIVE
POC PROTEIN,UA: NEGATIVE
RBC UA: NEGATIVE
Urobilinogen, UA: 0.2 E.U./dL
pH, UA: 6 (ref 5.0–8.0)

## 2019-01-26 NOTE — Progress Notes (Signed)
Subjective:  Patient ID: [place], female    DOB: 02-15-1935  Age: 84 y.o. MRN: [id number]  CC: Bilateral Shoulder Pain  HPI Caroline [place] is a 84 year female with a past medical history of hypertension, diabetes mellitus (diet controlled with A1c of 5.8) and hyperlipidemia.She presents to clinic today  With complaints of back pain/shoulder pain.  Back/Shoulder Pain -  She rates her shoulder and back pain currently a 0/10. She says the shoulder pain began about 4 days ago with the right posterior shoulder (stopped and then began on her posterior left shoulder). She was been treating her back pain/ shoulder pain with tylenol 650 mg, last time she had some was yesterday. She denies injuries, trauma, or recent falls.  Denies heavy lifting but that cannot be ascertained as she endorses performing ADLs at home and cleaning her house.  Of note she resides with her elderly husband at home and they have no help with healthy children who live close by and check up on them. .  Hypertension Blood pressure today is 177/76; she has not taken her blood medications today yet. Denies chest pain, shortness of breath, pedal edema.   Diabetes Mellitus - she does take any medication to help manage her diabetes. Managed by diet and lifestyle modifications.    Past Medical History:  Diagnosis Date  . Diabetes mellitus without complication ([place])   . Diverticulitis   . DM (diabetes mellitus) ([place]) 09/24/2015  . HTN (hypertension) 09/24/2015  . Hyperlipidemia 09/24/2015  . Hypertension     Past Surgical History:  Procedure Laterality Date  . ABDOMINAL HYSTERECTOMY    . CHOLECYSTECTOMY    . COLON SURGERY     COLECTOMY FOR DIVERTICULITIS  . HERNIA REPAIR      Family History  Problem Relation Age of Onset  . Hypertension Other     Allergies  Allergen Reactions  . Penicillins Rash     Has patient had a PCN reaction causing immediate rash, facial/tongue/throat  swelling, SOB or lightheadedness with hypotension: No Has patient had a PCN reaction causing severe rash involving mucus membranes or skin necrosis: No Has patient had a PCN reaction that required hospitalization No Has patient had a PCN reaction occurring within the last 10 years: No If all of the above answers are "NO", then may proceed with Cephalosporin use.     Outpatient Medications Prior to Visit  Medication Sig Dispense Refill  . acetaminophen (TYLENOL) 500 MG tablet Take 1,000 mg by mouth every 8 (eight) hours as needed for moderate pain or headache.    [place] atorvastatin (LIPITOR) 40 MG tablet Take 1 tablet (40 mg total) by mouth daily. 90 tablet 1  . carvedilol (COREG) 25 MG tablet TAKE 1 TABLET BY MOUTH TWICE DAILY WITH MEAL 180 tablet 1  . donepezil (ARICEPT) 10 MG tablet Take 1 tablet (10 mg total) by mouth at bedtime. 90 tablet 1  . hydrALAZINE (APRESOLINE) 25 MG tablet Take 1 tablet (25 mg total) by mouth 2 (two) times daily. 180 tablet 1  . losartan (COZAAR) 100 MG tablet Take 1 tablet (100 mg total) by mouth daily. 90 tablet 1  . olopatadine (PATANOL) 0.1 % ophthalmic solution Place 1 drop into both eyes 2 (two) times daily. (Patient not taking: Reported on 09/03/2018) 5 mL 2  . sulfamethoxazole-trimethoprim (BACTRIM DS,SEPTRA DS) 800-160 MG tablet Take 1 tablet by mouth 2 (two) times daily. 14 tablet 0   No facility-administered medications prior to visit.  ROS Review of Systems  Constitutional: Negative for activity change, chills, fatigue, fever and unexpected weight change.  Eyes: Negative for visual disturbance.  Respiratory: Negative for shortness of breath and wheezing.   Cardiovascular: Negative for chest pain, palpitations and leg swelling.  Endocrine: Negative for polydipsia, polyphagia and polyuria.  Genitourinary: Negative for dysuria and frequency.  Musculoskeletal: Positive for back pain. Negative for gait problem, joint swelling, myalgias, neck pain and  neck stiffness.       She states back and shoulder pain has resolved  Neurological: Negative for weakness and numbness.  Psychiatric/Behavioral: Negative for agitation, behavioral problems and confusion.    Objective:  There were no vitals taken for this visit.  BP/Weight 09/03/2018 07/06/1659 05/05/159  Systolic BP 109 323 557  Diastolic BP 76 62 68  Wt. (Lbs) 153.6 155.6 153  BMI 26.37 26.71 26.26      Physical Exam Constitutional:      General: She is not in acute distress.    Appearance: Normal appearance. She is normal weight.  HENT:     Head: Normocephalic and atraumatic.  Neck:     Musculoskeletal: Normal range of motion and neck supple. No neck rigidity or muscular tenderness.     Vascular: No carotid bruit.  Cardiovascular:     Rate and Rhythm: Normal rate and regular rhythm.     Pulses: Normal pulses.     Heart sounds: Normal heart sounds. No murmur. No friction rub. No gallop.   Pulmonary:     Effort: Pulmonary effort is normal.     Breath sounds: Normal breath sounds. No wheezing, rhonchi or rales.  Musculoskeletal:     Right shoulder: Normal. She exhibits normal range of motion, no tenderness, no swelling, no deformity, no pain and normal strength.     Left shoulder: Normal. She exhibits normal range of motion, no tenderness, no swelling, no deformity, no pain and normal strength.     Thoracic back: She exhibits normal range of motion, no tenderness, no swelling, no edema, no deformity and no pain.  Skin:    General: Skin is warm and dry.     Capillary Refill: Capillary refill takes less than 2 seconds.  Neurological:     General: No focal deficit present.     Mental Status: She is alert and oriented to person, place, and time.     Sensory: No sensory deficit.     Motor: No weakness.     Gait: Gait normal.  Psychiatric:        Behavior: Behavior normal.        Thought Content: Thought content normal.        Judgment: Judgment normal.     CMP Latest Ref  Rng & Units 09/03/2018 12/03/2017 11/17/2017  Glucose 65 - 99 mg/dL 101(H) 93 98  BUN 8 - 27 mg/dL 19 19 24(H)  Creatinine 0.57 - 1.00 mg/dL 1.02(H) 1.19(H) 1.00  Sodium 134 - 144 mmol/L 144 141 132(L)  Potassium 3.5 - 5.2 mmol/L 4.4 4.1 3.9  Chloride 96 - 106 mmol/L 103 97 102  CO2 20 - 29 mmol/L 25 24 23   Calcium 8.7 - 10.3 mg/dL 9.3 8.8 8.0(L)  Total Protein 6.0 - 8.5 g/dL 7.2 - -  Total Bilirubin 0.0 - 1.2 mg/dL 0.7 - -  Alkaline Phos 39 - 117 IU/L 94 - -  AST 0 - 40 IU/L 17 - -  ALT 0 - 32 IU/L 11 - -    Lipid Panel  Component Value Date/Time   CHOL 140 09/03/2018 1127   TRIG 122 09/03/2018 1127   HDL 56 09/03/2018 1127   CHOLHDL 2.5 09/03/2018 1127   CHOLHDL 3.0 10/03/2016 0859   VLDL 26 10/03/2016 0859   LDLCALC 60 09/03/2018 1127    CBC    Component Value Date/Time   WBC 11.0 (H) 11/17/2017 0431   RBC 3.46 (L) 11/17/2017 0431   HGB 10.6 (L) 11/17/2017 0431   HCT 31.2 (L) 11/17/2017 0431   PLT 133 (L) 11/17/2017 0431   MCV 90.2 11/17/2017 0431   MCH 30.6 11/17/2017 0431   MCHC 34.0 11/17/2017 0431   RDW 14.2 11/17/2017 0431   LYMPHSABS 1.1 11/17/2017 0431   MONOABS 0.6 11/17/2017 0431   EOSABS 0.1 11/17/2017 0431   BASOSABS 0.0 11/17/2017 0431    Lab Results  Component Value Date   HGBA1C 5.8 09/03/2018    Assessment & Plan:    1. Diabetes mellitus without complication ([place]) Diet controlled - Currently she is not on any medication to manage her diabetes. Continue diet and lifestyle modifications.  2. Acute pain of both shoulders Resolved No additional medication indicated Holding off on muscle relaxant due to advanced age - Pain has now resolved. Patient denies trauma, injuries or fall. It is most likely that both her back and bilateral shoulder pain is musculoskeletal in nature.  - Continue Tylenol 650 mg as needed for shoulder/back pain. If tylenol is not effective will consider ordering X-Ray.  3. Acute midline thoracic back pain Resolved -  Please see the plan above.   No orders of the defined types were placed in this encounter.   Follow-up: Follow-up of chronic medical conditions, keep previously scheduled appointment.      Charlott Rakes, MD, FAAFP. [facility] [place], [place]   01/26/2019, 3:00 PM

## 2019-01-27 ENCOUNTER — Encounter: Payer: Self-pay | Admitting: Family Medicine

## 2019-02-25 ENCOUNTER — Other Ambulatory Visit: Payer: Self-pay | Admitting: Family Medicine

## 2019-02-25 DIAGNOSIS — R413 Other amnesia: Secondary | ICD-10-CM

## 2019-02-25 DIAGNOSIS — E7800 Pure hypercholesterolemia, unspecified: Secondary | ICD-10-CM

## 2019-02-25 DIAGNOSIS — I10 Essential (primary) hypertension: Secondary | ICD-10-CM

## 2019-03-25 ENCOUNTER — Ambulatory Visit: Payer: Medicare Other | Admitting: Family Medicine

## 2019-05-17 ENCOUNTER — Other Ambulatory Visit: Payer: Self-pay | Admitting: Family Medicine

## 2019-05-17 DIAGNOSIS — I10 Essential (primary) hypertension: Secondary | ICD-10-CM

## 2019-05-22 ENCOUNTER — Other Ambulatory Visit: Payer: Self-pay | Admitting: Family Medicine

## 2019-05-22 DIAGNOSIS — I10 Essential (primary) hypertension: Secondary | ICD-10-CM

## 2019-05-24 ENCOUNTER — Other Ambulatory Visit: Payer: Self-pay | Admitting: Family Medicine

## 2019-05-24 DIAGNOSIS — E7800 Pure hypercholesterolemia, unspecified: Secondary | ICD-10-CM

## 2019-05-24 DIAGNOSIS — I10 Essential (primary) hypertension: Secondary | ICD-10-CM

## 2019-05-26 ENCOUNTER — Telehealth: Payer: Self-pay

## 2019-05-26 NOTE — Telephone Encounter (Signed)
Spoke with patient's daughter and told her about the pneumonia vaccine and why I thought her mother would be a good candidate. She stated that she would talk to her mother today and also bring it up at the clinic visit tomorrow (6/25). She told me her mother would respond better if we bring up the information at the visit since patient is suffering from dementia.

## 2019-05-26 NOTE — Telephone Encounter (Signed)
Called patient for the student-led [id number] vaccine initiative.

## 2019-05-27 ENCOUNTER — Ambulatory Visit: Payer: Medicare Other | Attending: Family Medicine | Admitting: Family Medicine

## 2019-05-27 ENCOUNTER — Other Ambulatory Visit: Payer: Self-pay

## 2019-06-03 ENCOUNTER — Other Ambulatory Visit: Payer: Self-pay | Admitting: Family Medicine

## 2019-06-03 DIAGNOSIS — I10 Essential (primary) hypertension: Secondary | ICD-10-CM

## 2019-06-06 DIAGNOSIS — H00019 Hordeolum externum unspecified eye, unspecified eyelid: Secondary | ICD-10-CM | POA: Diagnosis not present

## 2019-06-17 DIAGNOSIS — H0012 Chalazion right lower eyelid: Secondary | ICD-10-CM | POA: Diagnosis not present

## 2019-06-17 DIAGNOSIS — H43813 Vitreous degeneration, bilateral: Secondary | ICD-10-CM | POA: Diagnosis not present

## 2019-06-17 DIAGNOSIS — Z961 Presence of intraocular lens: Secondary | ICD-10-CM | POA: Diagnosis not present

## 2019-06-17 DIAGNOSIS — H40013 Open angle with borderline findings, low risk, bilateral: Secondary | ICD-10-CM | POA: Diagnosis not present

## 2019-06-23 ENCOUNTER — Other Ambulatory Visit: Payer: Self-pay | Admitting: Family Medicine

## 2019-06-23 DIAGNOSIS — I10 Essential (primary) hypertension: Secondary | ICD-10-CM

## 2019-06-26 ENCOUNTER — Other Ambulatory Visit: Payer: Self-pay | Admitting: Family Medicine

## 2019-06-26 DIAGNOSIS — I10 Essential (primary) hypertension: Secondary | ICD-10-CM

## 2019-07-14 ENCOUNTER — Other Ambulatory Visit: Payer: Self-pay | Admitting: Family Medicine

## 2019-07-14 DIAGNOSIS — I10 Essential (primary) hypertension: Secondary | ICD-10-CM

## 2019-07-14 DIAGNOSIS — E7800 Pure hypercholesterolemia, unspecified: Secondary | ICD-10-CM

## 2019-07-23 ENCOUNTER — Other Ambulatory Visit: Payer: Self-pay | Admitting: Family Medicine

## 2019-07-23 DIAGNOSIS — I10 Essential (primary) hypertension: Secondary | ICD-10-CM

## 2019-07-29 ENCOUNTER — Telehealth: Payer: Self-pay | Admitting: Family Medicine

## 2019-07-29 NOTE — Telephone Encounter (Signed)
Patient daughter will call back to schedule appt

## 2019-07-29 NOTE — Telephone Encounter (Signed)
-----   Message from Carilyn Goodpasture, RN sent at 07/23/2019  1:03 PM EDT ----- Regarding: OV Please schedule an OV for patient to have additional medication refills.

## 2019-08-05 DIAGNOSIS — I10 Essential (primary) hypertension: Secondary | ICD-10-CM | POA: Diagnosis not present

## 2019-08-05 DIAGNOSIS — R351 Nocturia: Secondary | ICD-10-CM | POA: Diagnosis not present

## 2019-08-05 DIAGNOSIS — G309 Alzheimer's disease, unspecified: Secondary | ICD-10-CM | POA: Diagnosis not present

## 2019-08-05 DIAGNOSIS — E785 Hyperlipidemia, unspecified: Secondary | ICD-10-CM | POA: Diagnosis not present

## 2019-08-05 DIAGNOSIS — E119 Type 2 diabetes mellitus without complications: Secondary | ICD-10-CM | POA: Diagnosis not present

## 2019-08-05 DIAGNOSIS — R6889 Other general symptoms and signs: Secondary | ICD-10-CM | POA: Diagnosis not present

## 2019-08-05 DIAGNOSIS — Z1331 Encounter for screening for depression: Secondary | ICD-10-CM | POA: Diagnosis not present

## 2019-08-05 DIAGNOSIS — R443 Hallucinations, unspecified: Secondary | ICD-10-CM | POA: Diagnosis not present

## 2019-08-06 DIAGNOSIS — E7849 Other hyperlipidemia: Secondary | ICD-10-CM | POA: Diagnosis not present

## 2019-08-06 DIAGNOSIS — E119 Type 2 diabetes mellitus without complications: Secondary | ICD-10-CM | POA: Diagnosis not present

## 2019-08-10 DIAGNOSIS — R82998 Other abnormal findings in urine: Secondary | ICD-10-CM | POA: Diagnosis not present

## 2019-08-22 ENCOUNTER — Other Ambulatory Visit: Payer: Self-pay | Admitting: Family Medicine

## 2019-08-22 DIAGNOSIS — R413 Other amnesia: Secondary | ICD-10-CM

## 2019-09-16 ENCOUNTER — Encounter (HOSPITAL_COMMUNITY): Payer: Self-pay | Admitting: Emergency Medicine

## 2019-09-16 ENCOUNTER — Inpatient Hospital Stay (HOSPITAL_COMMUNITY)
Admission: EM | Admit: 2019-09-16 | Discharge: 2019-09-19 | DRG: 641 | Disposition: A | Discharge status: Home / Self Care | Payer: Medicare Other | Source: Ambulatory Visit | Attending: Internal Medicine | Admitting: Internal Medicine

## 2019-09-16 ENCOUNTER — Other Ambulatory Visit: Payer: Self-pay

## 2019-09-16 DIAGNOSIS — E876 Hypokalemia: Secondary | ICD-10-CM | POA: Diagnosis present

## 2019-09-16 DIAGNOSIS — T38895A Adverse effect of other hormones and synthetic substitutes, initial encounter: Secondary | ICD-10-CM | POA: Diagnosis present

## 2019-09-16 DIAGNOSIS — E119 Type 2 diabetes mellitus without complications: Secondary | ICD-10-CM | POA: Diagnosis present

## 2019-09-16 DIAGNOSIS — E871 Hypo-osmolality and hyponatremia: Principal | ICD-10-CM | POA: Diagnosis present

## 2019-09-16 DIAGNOSIS — Z79899 Other long term (current) drug therapy: Secondary | ICD-10-CM | POA: Diagnosis not present

## 2019-09-16 DIAGNOSIS — M7989 Other specified soft tissue disorders: Secondary | ICD-10-CM | POA: Diagnosis present

## 2019-09-16 DIAGNOSIS — R358 Other polyuria: Secondary | ICD-10-CM | POA: Diagnosis present

## 2019-09-16 DIAGNOSIS — R410 Disorientation, unspecified: Secondary | ICD-10-CM | POA: Diagnosis not present

## 2019-09-16 DIAGNOSIS — I10 Essential (primary) hypertension: Secondary | ICD-10-CM | POA: Diagnosis present

## 2019-09-16 DIAGNOSIS — I361 Nonrheumatic tricuspid (valve) insufficiency: Secondary | ICD-10-CM | POA: Diagnosis not present

## 2019-09-16 DIAGNOSIS — Z8249 Family history of ischemic heart disease and other diseases of the circulatory system: Secondary | ICD-10-CM | POA: Diagnosis not present

## 2019-09-16 DIAGNOSIS — Z880 Allergy status to penicillin: Secondary | ICD-10-CM

## 2019-09-16 DIAGNOSIS — E7849 Other hyperlipidemia: Secondary | ICD-10-CM

## 2019-09-16 DIAGNOSIS — E785 Hyperlipidemia, unspecified: Secondary | ICD-10-CM | POA: Diagnosis present

## 2019-09-16 DIAGNOSIS — R319 Hematuria, unspecified: Secondary | ICD-10-CM | POA: Diagnosis present

## 2019-09-16 DIAGNOSIS — Z20828 Contact with and (suspected) exposure to other viral communicable diseases: Secondary | ICD-10-CM | POA: Diagnosis present

## 2019-09-16 DIAGNOSIS — I4510 Unspecified right bundle-branch block: Secondary | ICD-10-CM | POA: Diagnosis present

## 2019-09-16 DIAGNOSIS — Z03818 Encounter for observation for suspected exposure to other biological agents ruled out: Secondary | ICD-10-CM | POA: Diagnosis not present

## 2019-09-16 DIAGNOSIS — R609 Edema, unspecified: Secondary | ICD-10-CM | POA: Diagnosis not present

## 2019-09-16 DIAGNOSIS — R109 Unspecified abdominal pain: Secondary | ICD-10-CM | POA: Diagnosis not present

## 2019-09-16 LAB — BASIC METABOLIC PANEL
Anion gap: 11 (ref 5–15)
BUN: 12 mg/dL (ref 8–23)
CO2: 23 mmol/L (ref 22–32)
Calcium: 8.6 mg/dL — ABNORMAL LOW (ref 8.9–10.3)
Chloride: 85 mmol/L — ABNORMAL LOW (ref 98–111)
Creatinine, Ser: 0.7 mg/dL (ref 0.44–1.00)
GFR calc Af Amer: >60 mL/min (ref >60)
GFR calc non Af Amer: >60 mL/min (ref >60)
Glucose, Bld: 110 mg/dL — ABNORMAL HIGH (ref 70–99)
Potassium: 3.9 mmol/L (ref 3.5–5.1)
Sodium: 119 mmol/L — CL (ref 135–145)

## 2019-09-16 LAB — CBC WITH DIFFERENTIAL/PLATELET
Abs Immature Granulocytes: 0.03 10*3/uL (ref 0.00–0.07)
Basophils Absolute: 0 10*3/uL (ref 0.0–0.1)
Basophils Relative: 0 %
Eosinophils Absolute: 0 10*3/uL (ref 0.0–0.5)
Eosinophils Relative: 1 %
HCT: 36.4 % (ref 36.0–46.0)
Hemoglobin: 12.9 g/dL (ref 12.0–15.0)
Immature Granulocytes: 0 %
Lymphocytes Relative: 28 %
Lymphs Abs: 2.2 10*3/uL (ref 0.7–4.0)
MCH: 31.2 pg (ref 26.0–34.0)
MCHC: 35.4 g/dL (ref 30.0–36.0)
MCV: 87.9 fL (ref 80.0–100.0)
Monocytes Absolute: 0.6 10*3/uL (ref 0.1–1.0)
Monocytes Relative: 8 %
Neutro Abs: 5.1 10*3/uL (ref 1.7–7.7)
Neutrophils Relative %: 63 %
Platelets: 155 10*3/uL (ref 150–400)
RBC: 4.14 MIL/uL (ref 3.87–5.11)
RDW: 12.3 % (ref 11.5–15.5)
WBC: 8 10*3/uL (ref 4.0–10.5)
nRBC: 0 % (ref 0.0–0.2)

## 2019-09-16 LAB — URINALYSIS, ROUTINE W REFLEX MICROSCOPIC
Bacteria, UA: NONE SEEN
Bilirubin Urine: NEGATIVE
Glucose, UA: NEGATIVE mg/dL
Ketones, ur: NEGATIVE mg/dL
Leukocytes,Ua: NEGATIVE
Nitrite: NEGATIVE
Protein, ur: 30 mg/dL — AB
Specific Gravity, Urine: 1.011 (ref 1.005–1.030)
pH: 6 (ref 5.0–8.0)

## 2019-09-16 MED ORDER — ACETAMINOPHEN 650 MG RE SUPP: 650.0000 mg | Freq: Four times a day (QID) | RECTAL | Status: DC | PRN

## 2019-09-16 MED ORDER — ACETAMINOPHEN 325 MG PO TABS
650.0000 mg | ORAL_TABLET | Freq: Four times a day (QID) | ORAL | Status: DC | PRN
  Administered 2019-09-17 – 2019-09-18 (×2): 650 mg via ORAL
  Filled 2019-09-16 (×2): qty 2

## 2019-09-16 MED ORDER — ENOXAPARIN SODIUM 40 MG/0.4ML ~~LOC~~ SOLN
40.0000 mg | SUBCUTANEOUS | Status: DC
  Administered 2019-09-17 – 2019-09-18 (×2): 40 mg via SUBCUTANEOUS
  Filled 2019-09-16 (×2): qty 0.4

## 2019-09-16 MED ORDER — SODIUM CHLORIDE 0.9 % IV BOLUS: 1000.0000 mL | Freq: Once | INTRAVENOUS | Status: DC

## 2019-09-16 MED ORDER — SODIUM CHLORIDE 0.9 % IV SOLN
INTRAVENOUS | Status: DC
  Administered 2019-09-17 – 2019-09-18 (×3): via INTRAVENOUS

## 2019-09-16 NOTE — H&P (Addendum)
TRH H&P    Patient Demographics:    Caroline Cooper, is a 84 y.o. female  MRN: [id number]  DOB - Feb 04, 1935  Admit Date - 09/16/2019  Referring MD/NP/PA:  Quincy Carnes  Outpatient Primary MD for the patient is Charlott Rakes, MD  Patient coming from:  home  Chief complaint-   Bilateral feet swelling,    HPI:    [place]  is a 84 y.o. female,  w hypertension, hyperlipidemia, Dm2, diverticulosis, apparently presents with bilateral feet swelling after 3 days of desmopressin, found in ED to have sodium of 119  In ED,  T 98.5, P 60 R 18, Bp 190/90  Pox 100% on RA  Wbc 8.0, Hgb 12.9, Plt 155 Na 119, K 3.9, Bun 12, Creatinine 0.70  urinalysis rbc 6-10, wbc 0-5 , prot 30  Pt will be admitted for lower ext swelling and hyponatremia.     Review of systems:    In addition to the HPI above,  No Fever-chills, No Headache, No changes with Vision or hearing, No problems swallowing food or Liquids, No Chest pain, Cough or Shortness of Breath, No Abdominal pain, No Nausea or Vomiting, bowel movements are regular, No Blood in stool or Urine, No dysuria, No new skin rashes or bruises, No new joints pains-aches,  No new weakness, tingling, numbness in any extremity, No recent weight gain or loss, No polyuria, polydypsia or polyphagia, No significant Mental Stressors.  All other systems reviewed and are negative.    Past History of the following :    Past Medical History:  Diagnosis Date  . Diabetes mellitus without complication ([place])   . Diverticulitis   . DM (diabetes mellitus) ([place]) 09/24/2015  . HTN (hypertension) 09/24/2015  . Hyperlipidemia 09/24/2015  . Hypertension       Past Surgical History:  Procedure Laterality Date  . ABDOMINAL HYSTERECTOMY    . CHOLECYSTECTOMY    . COLON SURGERY     COLECTOMY FOR DIVERTICULITIS  . HERNIA REPAIR        Social History:       Social History   Tobacco Use  . Smoking status: Never Smoker  . Smokeless tobacco: Never Used  Substance Use Topics  . Alcohol use: No       Family History :     Family History  Problem Relation Age of Onset  . Hypertension Other        Home Medications:   Prior to Admission medications   Medication Sig Start Date End Date Taking? Authorizing Provider  acetaminophen (TYLENOL) 500 MG tablet Take 1,000 mg by mouth every 8 (eight) hours as needed for moderate pain or headache.   Yes [provider]  atorvastatin (LIPITOR) 40 MG tablet TAKE 1 TABLET BY MOUTH EVERY DAY Patient taking differently: Take 40 mg by mouth daily.  05/24/19  Yes Charlott Rakes, MD  carvedilol (COREG) 25 MG tablet TAKE 1 TABLET BY MOUTH TWICE DAILY WITH FOOD Patient taking differently: Take 25 mg by mouth 2 (two) times daily with a  meal.  02/25/19  Yes Charlott Rakes, MD  desmopressin (DDAVP) 0.2 MG tablet Take 0.4 mg by mouth at bedtime. 09/13/19  Yes [provider]  hydrALAZINE (APRESOLINE) 25 MG tablet Take 1 tablet (25 mg total) by mouth 2 (two) times daily. MUST MAKE APPT FOR FURTHER REFILLS 05/17/19  Yes Charlott Rakes, MD  losartan (COZAAR) 100 MG tablet Take 1 tablet (100 mg total) by mouth daily. Must make appt for more refills. 06/23/19  Yes Newlin, Enobong, MD  donepezil (ARICEPT) 10 MG tablet TAKE 1 TABLET BY MOUTH EVERY NIGHT AT BEDTIME Patient not taking: Reported on 09/16/2019 02/25/19   Charlott Rakes, MD  olopatadine (PATANOL) 0.1 % ophthalmic solution Place 1 drop into both eyes 2 (two) times daily. Patient not taking: Reported on 09/03/2018 05/09/17   Charlott Rakes, MD  sulfamethoxazole-trimethoprim (BACTRIM DS,SEPTRA DS) 800-160 MG tablet Take 1 tablet by mouth 2 (two) times daily. Patient not taking: Reported on 01/26/2019 09/03/18   Charlott Rakes, MD     Allergies:     Allergies  Allergen Reactions  . Penicillins Rash     Has patient had a PCN reaction causing  immediate rash, facial/tongue/throat swelling, SOB or lightheadedness with hypotension: No Has patient had a PCN reaction causing severe rash involving mucus membranes or skin necrosis: No Has patient had a PCN reaction that required hospitalization No Has patient had a PCN reaction occurring within the last 10 years: No If all of the above answers are "NO", then may proceed with Cephalosporin use.      Physical Exam:   Vitals  Blood pressure (!) 196/90, pulse 61, temperature 98.5 F (36.9 C), temperature source Oral, resp. rate 16, SpO2 100 %.  1.  General:  axoxo3  2. Psychiatric: euthymic  3. Neurologic: Nonfocal, cn2-12 intact, reflexes 2+ symmetric, diffuse with no clonus, motor 5/5 in all 4 ext  4. HEENMT:  Anicteric, pupils 1.82mm symmetric, direct, consensual, near intact Neck: no jvd  5. Respiratory : CTAB  6. Cardiovascular : rrr s1, s2, no m/g/r  7. Gastrointestinal:  ABd: soft, obese, nt, nd, +bs  8. Skin:  Ext: no c/c/e,  No rash  9.Musculoskeletal:  Good ROM    Data Review:    CBC Recent Labs  Lab 09/16/19 1913  WBC 8.0  HGB 12.9  HCT 36.4  PLT 155  MCV 87.9  MCH 31.2  MCHC 35.4  RDW 12.3  LYMPHSABS 2.2  MONOABS 0.6  EOSABS 0.0  BASOSABS 0.0   ------------------------------------------------------------------------------------------------------------------  Results for orders placed or performed during the hospital encounter of 09/16/19 (from the past 48 hour(s))  Urinalysis, Routine w reflex microscopic- may I&O cath if menses     Status: Abnormal   Collection Time: 09/16/19  6:52 PM  Result Value Ref Range   Color, Urine YELLOW YELLOW   APPearance CLEAR CLEAR   Specific Gravity, Urine 1.011 1.005 - 1.030   pH 6.0 5.0 - 8.0   Glucose, UA NEGATIVE NEGATIVE mg/dL   Hgb urine dipstick MODERATE (A) NEGATIVE   Bilirubin Urine NEGATIVE NEGATIVE   Ketones, ur NEGATIVE NEGATIVE mg/dL   Protein, ur 30 (A) NEGATIVE mg/dL   Nitrite  NEGATIVE NEGATIVE   Leukocytes,Ua NEGATIVE NEGATIVE   RBC / HPF 6-10 0 - 5 RBC/hpf   WBC, UA 0-5 0 - 5 WBC/hpf   Bacteria, UA NONE SEEN NONE SEEN   Mucus PRESENT     Comment: Performed at [facility], [place] [address], [place], [place] [address]  CBC with Differential/Platelet     Status: None   Collection Time: 09/16/19  7:13 PM  Result Value Ref Range   WBC 8.0 4.0 - 10.5 K/uL    Comment: WHITE COUNT CONFIRMED ON SMEAR   RBC 4.14 3.87 - 5.11 MIL/uL   Hemoglobin 12.9 12.0 - 15.0 g/dL   HCT 36.4 36.0 - 46.0 %   MCV 87.9 80.0 - 100.0 fL   MCH 31.2 26.0 - 34.0 pg   MCHC 35.4 30.0 - 36.0 g/dL   RDW 12.3 11.5 - 15.5 %   Platelets 155 150 - 400 K/uL   nRBC 0.0 0.0 - 0.2 %   Neutrophils Relative % 63 %   Neutro Abs 5.1 1.7 - 7.7 K/uL   Lymphocytes Relative 28 %   Lymphs Abs 2.2 0.7 - 4.0 K/uL   Monocytes Relative 8 %   Monocytes Absolute 0.6 0.1 - 1.0 K/uL   Eosinophils Relative 1 %   Eosinophils Absolute 0.0 0.0 - 0.5 K/uL   Basophils Relative 0 %   Basophils Absolute 0.0 0.0 - 0.1 K/uL   Immature Granulocytes 0 %   Abs Immature Granulocytes 0.03 0.00 - 0.07 K/uL   Reactive, Benign Lymphocytes PRESENT     Comment: Performed at [facility], [place] [address] [place], [place] [id number]  Basic metabolic panel     Status: Abnormal   Collection Time: 09/16/19  7:13 PM  Result Value Ref Range   Sodium 119 (LL) 135 - 145 mmol/L    Comment: CRITICAL RESULT CALLED TO, READ BACK BY AND VERIFIED WITH: B.BROOKS AT 2213 ON 09/16/19 BY N.THOMPSON    Potassium 3.9 3.5 - 5.1 mmol/L   Chloride 85 (L) 98 - 111 mmol/L   CO2 23 22 - 32 mmol/L   Glucose, Bld 110 (H) 70 - 99 mg/dL   BUN 12 8 - 23 mg/dL   Creatinine, Ser 0.70 0.44 - 1.00 mg/dL   Calcium 8.6 (L) 8.9 - 10.3 mg/dL   GFR calc non Af Amer >60 >60 mL/min   GFR calc Af Amer >60 >60 mL/min   Anion gap 11 5 - 15    Comment: Performed at [facility], [place] [address],  [place], [place] [address]    Chemistries  Recent Labs  Lab 09/16/19 1913  NA 119*  K 3.9  CL 85*  CO2 23  GLUCOSE 110*  BUN 12  CREATININE 0.70  CALCIUM 8.6*   ------------------------------------------------------------------------------------------------------------------  ------------------------------------------------------------------------------------------------------------------ GFR: CrCl cannot be calculated (Unknown ideal weight.). Liver Function Tests: No results for input(s): AST, ALT, ALKPHOS, BILITOT, PROT, ALBUMIN in the last 168 hours. No results for input(s): LIPASE, AMYLASE in the last 168 hours. No results for input(s): AMMONIA in the last 168 hours. Coagulation Profile: No results for input(s): INR, PROTIME in the last 168 hours. Cardiac Enzymes: No results for input(s): CKTOTAL, CKMB, CKMBINDEX, TROPONINI in the last 168 hours. BNP (last 3 results) No results for input(s): PROBNP in the last 8760 hours. HbA1C: No results for input(s): HGBA1C in the last 72 hours. CBG: No results for input(s): GLUCAP in the last 168 hours. Lipid Profile: No results for input(s): CHOL, HDL, LDLCALC, TRIG, CHOLHDL, LDLDIRECT in the last 72 hours. Thyroid Function Tests: No results for input(s): TSH, T4TOTAL, FREET4, T3FREE, THYROIDAB in the last 72 hours. Anemia Panel: No results for input(s): VITAMINB12, FOLATE, FERRITIN, TIBC, IRON, RETICCTPCT in the last 72 hours.  --------------------------------------------------------------------------------------------------------------- Urine analysis:    Component Value Date/Time  COLORURINE YELLOW 09/16/2019 1852   APPEARANCEUR CLEAR 09/16/2019 1852   LABSPEC 1.011 09/16/2019 1852   PHURINE 6.0 09/16/2019 1852   GLUCOSEU NEGATIVE 09/16/2019 1852   HGBUR MODERATE (A) 09/16/2019 [place] 09/16/2019 1852   BILIRUBINUR negative 01/26/2019 1657   BILIRUBINUR small 06/03/2018 1001   [place]  09/16/2019 1852   PROTEINUR 30 (A) 09/16/2019 1852   UROBILINOGEN 0.2 01/26/2019 1657   UROBILINOGEN 0.2 09/24/2015 0833   NITRITE NEGATIVE 09/16/2019 1852   LEUKOCYTESUR NEGATIVE 09/16/2019 1852      Imaging Results:    No results found.     Assessment & Plan:    Principal Problem:   Hyponatremia Active Problems:   HTN (hypertension)   Diabetes mellitus without complication (HCC)   Hyperlipidemia  Hyponatremia STOP Desmopressin Check serum osm, tsh, cortisol Check urine sodium, urine osm Hydrate with ns at 51mL / hr iv Check bmp at 0200, cmp at 0700  Edema STOP Desmopressin Check cardiac echo Check cmp in am to see how albumin is  Hypertension Cont Carvedilol 25mg  po bid Cont hydralazine 25mg  po bid Cont Losartan 100mg  poq day  Hyperlipidemia Cont Lipitor 40mg  po qhs   DVT Prophylaxis-   Lovenox - SCDs   AM Labs Ordered, also please review Full Orders  Family Communication: Admission, patients condition and plan of care including tests being ordered have been discussed with the patient  who indicate understanding and agree with the plan and Code Status.  Code Status:  FULL CODE per patient, female  Family member is present in room with patient  Admission status: Inpatient: Based on patients clinical presentation and evaluation of above clinical data, I have made determination that patient meets Inpatient criteria at this time.  Pt has Na 119 and will require gentle ns iv,  Pt will require > 2 nites stay for sodium to resolve.   Time spent in minutes : 70   Jani Gravel M.D on 09/16/2019 at 11:46 PM

## 2019-09-16 NOTE — ED Provider Notes (Addendum)
[facility] DEPT Provider Note   CSN: [id number] Arrival date & time: 09/16/19  1818     History   Chief Complaint Chief Complaint  Patient presents with  . Foot Swelling  . sent from UC  . Hematuria    HPI Caroline Cooper is a 84 y.o. female.     The history is provided by the patient and medical records.  Hematuria     84 year old female with history of diabetes, hypertension, hyperlipidemia, presenting to the ED with hematuria and swelling of her feet.  Daughter reports the symptoms just began today.  She did start desmopressin 3 days ago to help with her nighttime urination.  She has not had any issues with this until today.  States she has not been drinking very much fluids lately.  They were told at urgent care that she may have a urinary tract infection and was sent here for further management.  She is not had any noted fever or chills.  No abdominal pain.  Past Medical History:  Diagnosis Date  . Diabetes mellitus without complication ([place])   . Diverticulitis   . DM (diabetes mellitus) ([place]) 09/24/2015  . HTN (hypertension) 09/24/2015  . Hyperlipidemia 09/24/2015  . Hypertension     Patient Active Problem List   Diagnosis Date Noted  . Memory loss   . Bacteremia due to Escherichia coli 11/16/2017  . Urinary tract infection with hematuria   . E. coli UTI 11/15/2017  . AKI (acute kidney injury) ([place]) 11/15/2017  . Acute encephalopathy 11/15/2017  . Altered mental status   . Sepsis ([place]) 11/14/2017  . Spinal stenosis of lumbar region 09/03/2017  . GERD (gastroesophageal reflux disease) 09/03/2017  . Depression 05/09/2017  . 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 Overactive bladder 11/19/2016  . 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 Seasonal allergies 03/14/2016  . Need for vaccination for H flu type B with pedvaxHIB 10/03/2015  . Neck pain   . Type 2 diabetes, HbA1c goal < 7% (HCC)   . Elevated troponin 09/24/2015  .  Neck pain on right side 09/24/2015  . Pain of right upper extremity 09/24/2015  . HTN (hypertension) 09/24/2015  . Diabetes mellitus without complication ([place]) 123XX123  . Hyperlipidemia 09/24/2015  . Leukocytosis 09/24/2015  . Colitis:/ DIAGNOSED 09/18/2015 09/24/2015    Past Surgical History:  Procedure Laterality Date  . ABDOMINAL HYSTERECTOMY    . CHOLECYSTECTOMY    . COLON SURGERY     COLECTOMY FOR DIVERTICULITIS  . HERNIA REPAIR       OB History   No obstetric history on file.      Home Medications    Prior to Admission medications   Medication Sig Start Date End Date Taking? Authorizing Provider  acetaminophen (TYLENOL) 500 MG tablet Take 1,000 mg by mouth every 8 (eight) hours as needed for moderate pain or headache.    [provider]  atorvastatin (LIPITOR) 40 MG tablet TAKE 1 TABLET BY MOUTH EVERY DAY 05/24/19   Charlott Rakes, MD  carvedilol (COREG) 25 MG tablet TAKE 1 TABLET BY MOUTH TWICE DAILY WITH FOOD 02/25/19   Charlott Rakes, MD  donepezil (ARICEPT) 10 MG tablet TAKE 1 TABLET BY MOUTH EVERY NIGHT AT BEDTIME 02/25/19   Charlott Rakes, MD  hydrALAZINE (APRESOLINE) 25 MG tablet Take 1 tablet (25 mg total) by mouth 2 (two) times daily. MUST MAKE APPT FOR FURTHER REFILLS 05/17/19   Charlott Rakes, MD  losartan (COZAAR) 100 MG tablet Take  1 tablet (100 mg total) by mouth daily. Must make appt for more refills. 06/23/19   Charlott Rakes, MD  olopatadine (PATANOL) 0.1 % ophthalmic solution Place 1 drop into both eyes 2 (two) times daily. Patient not taking: Reported on 09/03/2018 05/09/17   Charlott Rakes, MD  sulfamethoxazole-trimethoprim (BACTRIM DS,SEPTRA DS) 800-160 MG tablet Take 1 tablet by mouth 2 (two) times daily. Patient not taking: Reported on 01/26/2019 09/03/18   Charlott Rakes, MD    Family History Family History  Problem Relation Age of Onset  . Hypertension Other     Social History Social History   Tobacco Use  . Smoking status:  Never Smoker  . Smokeless tobacco: Never Used  Substance Use Topics  . Alcohol use: No  . Drug use: No     Allergies   Penicillins   Review of Systems Review of Systems  Cardiovascular: Positive for leg swelling (feet).  Genitourinary: Positive for hematuria.  All other systems reviewed and are negative.    Physical Exam Updated Vital Signs BP (!) 190/90 (BP Location: Left Arm)   Pulse 60   Temp 98.5 F (36.9 C) (Oral)   Resp 18   SpO2 100%   Physical Exam Vitals signs and nursing note reviewed.  Constitutional:      Appearance: She is well-developed.     Comments: Elderly, NAD  HENT:     Head: Normocephalic and atraumatic.  Eyes:     Conjunctiva/sclera: Conjunctivae normal.     Pupils: Pupils are equal, round, and reactive to light.  Neck:     Musculoskeletal: Normal range of motion.  Cardiovascular:     Rate and Rhythm: Normal rate and regular rhythm.     Heart sounds: Normal heart sounds.  Pulmonary:     Effort: Pulmonary effort is normal.     Breath sounds: Normal breath sounds.  Abdominal:     General: Bowel sounds are normal.     Palpations: Abdomen is soft.  Musculoskeletal: Normal range of motion.     Comments: Trace edema of the feet and ankles  Skin:    General: Skin is warm and dry.  Neurological:     Mental Status: She is alert and oriented to person, place, and time.      ED Treatments / Results  Labs (all labs ordered are listed, but only abnormal results are displayed) Labs Reviewed  URINALYSIS, ROUTINE W REFLEX MICROSCOPIC - Abnormal; Notable for the following components:      Result Value   Hgb urine dipstick MODERATE (*)    Protein, ur 30 (*)    All other components within normal limits  BASIC METABOLIC PANEL - Abnormal; Notable for the following components:   Sodium 119 (*)    Chloride 85 (*)    Glucose, Bld 110 (*)    Calcium 8.6 (*)    All other components within normal limits  URINE CULTURE  SARS CORONAVIRUS 2 (TAT 6-24  HRS)  CBC WITH DIFFERENTIAL/PLATELET    EKG None  Radiology No results found.  Procedures Procedures (including critical care time)  CRITICAL CARE Performed by: Larene Pickett   Total critical care time: 35 minutes  Critical care time was exclusive of separately billable procedures and treating other patients.  Critical care was necessary to treat or prevent imminent or life-threatening deterioration.  Critical care was time spent personally by me on the following activities: development of treatment plan with patient and/or surrogate as well as nursing, discussions with consultants, evaluation  of patient's response to treatment, examination of patient, obtaining history from patient or surrogate, ordering and performing treatments and interventions, ordering and review of laboratory studies, ordering and review of radiographic studies, pulse oximetry and re-evaluation of patient's condition.   Medications Ordered in ED Medications - No data to display   Initial Impression / Assessment and Plan / ED Course  I have reviewed the triage vital signs and the nursing notes.  Pertinent labs & imaging results that were available during my care of the patient were reviewed by me and considered in my medical decision making (see chart for details).  84 y.o. F here with hematuria and bilateral foot swelling x 1 day.  Seen at urgent care and sent here for further evaluation.  She is afebrile and nontoxic, vitals are stable.  She does have trace edema of the ankles and feet.  Abdomen soft and benign.  Lungs are clear without any wheezes or rhonchi.  Labs with significant hyponatremia at 119.  UA with some blood and protein noted, but no signs of infection.  She is not having any current abdominal or flank pain.  Patient did recently start desmopressin 3 days ago for nocturia, suspect this is the likely etiology of her hyponatremia.  She will require admission for ongoing care.  Discussed with  hospitalist, Dr. Maudie Mercury-- recommends to start NS at 60/cc hour.  He will evaluate and admit for ongoing care.  1:48 AM Called to patient's room as she appeared flushed and was spitting into bag but no true emesis.  She did complain of mild chest pain but seems to be resolving.  VSS.  EKG with RBBB, unchanged from prior.  Troponin pending.  2:10 AM Called back into room for another episode-- patient spitting into bag, complaining of nausea.  She has not eaten in nearly 12 hours.  She is belching and feels hungry.  Questionable acid reflux? She was given zofran, offered broth and crackers.  2:49 AM Troponin is negative.  Patient eating/drinking.  Will continue to monitor while awaiting IP bed assignment.  Final Clinical Impressions(s) / ED Diagnoses   Final diagnoses:  Hyponatremia  Hematuria, unspecified type    ED Discharge Orders    None       Larene Pickett, PA-C 09/17/19 0053    Larene Pickett, PA-C 09/17/19 0249    Larene Pickett, PA-C 09/17/19 0249    Mol[place]us, Jenny Reichmann, MD 09/17/19 0630

## 2019-09-16 NOTE — ED Triage Notes (Signed)
Pt sent from UC for bilat feet swelling, hematuria. Daughter thinks it's a side effect of the new medication started taking 3 days ago, Desmopressin 0.2mg  for nocturia. Pt denies any pain at this time.

## 2019-09-17 ENCOUNTER — Other Ambulatory Visit: Payer: Self-pay

## 2019-09-17 ENCOUNTER — Inpatient Hospital Stay (HOSPITAL_COMMUNITY): Payer: Medicare Other

## 2019-09-17 DIAGNOSIS — I361 Nonrheumatic tricuspid (valve) insufficiency: Secondary | ICD-10-CM

## 2019-09-17 LAB — BASIC METABOLIC PANEL WITH GFR
Anion gap: 10 (ref 5–15)
Anion gap: 13 (ref 5–15)
Anion gap: 10 (ref 5–15)
BUN: 11 mg/dL (ref 8–23)
BUN: 11 mg/dL (ref 8–23)
BUN: 15 mg/dL (ref 8–23)
CO2: 22 mmol/L (ref 22–32)
CO2: 22 mmol/L (ref 22–32)
CO2: 24 mmol/L (ref 22–32)
Calcium: 8.2 mg/dL — ABNORMAL LOW (ref 8.9–10.3)
Calcium: 8.4 mg/dL — ABNORMAL LOW (ref 8.9–10.3)
Calcium: 8.4 mg/dL — ABNORMAL LOW (ref 8.9–10.3)
Chloride: 90 mmol/L — ABNORMAL LOW (ref 98–111)
Chloride: 85 mmol/L — ABNORMAL LOW (ref 98–111)
Chloride: 88 mmol/L — ABNORMAL LOW (ref 98–111)
Creatinine, Ser: 0.62 mg/dL (ref 0.44–1.00)
Creatinine, Ser: 0.73 mg/dL (ref 0.44–1.00)
Creatinine, Ser: 0.89 mg/dL (ref 0.44–1.00)
GFR calc Af Amer: >60 mL/min
GFR calc Af Amer: >60 mL/min
GFR calc Af Amer: >60 mL/min
GFR calc non Af Amer: >60 mL/min
GFR calc non Af Amer: >60 mL/min
GFR calc non Af Amer: >60 mL/min
Glucose, Bld: 111 mg/dL — ABNORMAL HIGH (ref 70–99)
Glucose, Bld: 142 mg/dL — ABNORMAL HIGH (ref 70–99)
Glucose, Bld: 118 mg/dL — ABNORMAL HIGH (ref 70–99)
Potassium: 3.5 mmol/L (ref 3.5–5.1)
Potassium: 3.6 mmol/L (ref 3.5–5.1)
Potassium: 3.8 mmol/L (ref 3.5–5.1)
Sodium: 122 mmol/L — ABNORMAL LOW (ref 135–145)
Sodium: 120 mmol/L — ABNORMAL LOW (ref 135–145)
Sodium: 122 mmol/L — ABNORMAL LOW (ref 135–145)

## 2019-09-17 LAB — COMPREHENSIVE METABOLIC PANEL
ALT: 16 U/L (ref 0–44)
AST: 30 U/L (ref 15–41)
Albumin: 3.8 g/dL (ref 3.5–5.0)
Alkaline Phosphatase: 79 U/L (ref 38–126)
Anion gap: 11 (ref 5–15)
BUN: 11 mg/dL (ref 8–23)
CO2: 22 mmol/L (ref 22–32)
Calcium: 8.3 mg/dL — ABNORMAL LOW (ref 8.9–10.3)
Chloride: 86 mmol/L — ABNORMAL LOW (ref 98–111)
Creatinine, Ser: 0.67 mg/dL (ref 0.44–1.00)
GFR calc Af Amer: >60 mL/min (ref >60)
GFR calc non Af Amer: >60 mL/min (ref >60)
Glucose, Bld: 115 mg/dL — ABNORMAL HIGH (ref 70–99)
Potassium: 3.7 mmol/L (ref 3.5–5.1)
Sodium: 119 mmol/L — CL (ref 135–145)
Total Bilirubin: 1.8 mg/dL — ABNORMAL HIGH (ref 0.3–1.2)
Total Protein: 7 g/dL (ref 6.5–8.1)

## 2019-09-17 LAB — CBC
HCT: 34.7 % — ABNORMAL LOW (ref 36.0–46.0)
Hemoglobin: 12.3 g/dL (ref 12.0–15.0)
MCH: 31.1 pg (ref 26.0–34.0)
MCHC: 35.4 g/dL (ref 30.0–36.0)
MCV: 87.6 fL (ref 80.0–100.0)
Platelets: 138 10*3/uL — ABNORMAL LOW (ref 150–400)
RBC: 3.96 MIL/uL (ref 3.87–5.11)
RDW: 12.2 % (ref 11.5–15.5)
WBC: 8.9 10*3/uL (ref 4.0–10.5)
nRBC: 0 % (ref 0.0–0.2)

## 2019-09-17 LAB — BASIC METABOLIC PANEL
Anion gap: 12 (ref 5–15)
BUN: 12 mg/dL (ref 8–23)
CO2: 23 mmol/L (ref 22–32)
Calcium: 8.3 mg/dL — ABNORMAL LOW (ref 8.9–10.3)
Chloride: 85 mmol/L — ABNORMAL LOW (ref 98–111)
Creatinine, Ser: 0.74 mg/dL (ref 0.44–1.00)
GFR calc Af Amer: >60 mL/min (ref >60)
GFR calc non Af Amer: >60 mL/min (ref >60)
Glucose, Bld: 114 mg/dL — ABNORMAL HIGH (ref 70–99)
Potassium: 3.3 mmol/L — ABNORMAL LOW (ref 3.5–5.1)
Sodium: 120 mmol/L — ABNORMAL LOW (ref 135–145)

## 2019-09-17 LAB — SARS CORONAVIRUS 2 (TAT 6-24 HRS): SARS Coronavirus 2: NEGATIVE

## 2019-09-17 LAB — TSH: TSH: 1.643 u[IU]/mL (ref 0.350–4.500)

## 2019-09-17 LAB — SODIUM, URINE, RANDOM: Sodium, Ur: 88 mmol/L

## 2019-09-17 LAB — ECHOCARDIOGRAM COMPLETE

## 2019-09-17 LAB — CORTISOL: Cortisol, Plasma: 6.1 ug/dL

## 2019-09-17 LAB — CBG MONITORING, ED: Glucose-Capillary: 124 mg/dL — ABNORMAL HIGH (ref 70–99)

## 2019-09-17 LAB — URINE CULTURE: Culture: NO GROWTH

## 2019-09-17 LAB — TROPONIN I (HIGH SENSITIVITY): Troponin I (High Sensitivity): 11 ng/L (ref <18)

## 2019-09-17 LAB — OSMOLALITY: Osmolality: 251 mOsm/kg — ABNORMAL LOW (ref 275–295)

## 2019-09-17 LAB — OSMOLALITY, URINE: Osmolality, Ur: 465 mOsm/kg (ref 300–900)

## 2019-09-17 MED ORDER — ATORVASTATIN CALCIUM 40 MG PO TABS
40.0000 mg | ORAL_TABLET | Freq: Every day | ORAL | Status: DC
  Administered 2019-09-17 – 2019-09-18 (×2): 40 mg via ORAL
  Filled 2019-09-17 (×2): qty 1

## 2019-09-17 MED ORDER — HYDRALAZINE HCL 25 MG PO TABS
25.0000 mg | ORAL_TABLET | Freq: Two times a day (BID) | ORAL | Status: DC
  Administered 2019-09-17 – 2019-09-18 (×3): 25 mg via ORAL
  Filled 2019-09-17 (×4): qty 1

## 2019-09-17 MED ORDER — LOSARTAN POTASSIUM 50 MG PO TABS
100.0000 mg | ORAL_TABLET | Freq: Every day | ORAL | Status: DC
  Administered 2019-09-17 – 2019-09-18 (×2): 100 mg via ORAL
  Filled 2019-09-17 (×2): qty 2

## 2019-09-17 MED ORDER — ONDANSETRON HCL 4 MG/2ML IJ SOLN
4.0000 mg | Freq: Once | INTRAMUSCULAR | Status: AC
  Administered 2019-09-17: 04:00:00 4 mg via INTRAVENOUS
  Filled 2019-09-17: qty 2

## 2019-09-17 MED ORDER — HYDRALAZINE HCL 20 MG/ML IJ SOLN
10.0000 mg | Freq: Four times a day (QID) | INTRAMUSCULAR | Status: DC | PRN
  Administered 2019-09-17: 10 mg via INTRAVENOUS
  Filled 2019-09-17: qty 1

## 2019-09-17 MED ORDER — CARVEDILOL 25 MG PO TABS
25.0000 mg | ORAL_TABLET | Freq: Two times a day (BID) | ORAL | Status: DC
  Administered 2019-09-17 – 2019-09-18 (×4): 25 mg via ORAL
  Filled 2019-09-17 (×4): qty 1

## 2019-09-17 MED ORDER — ALPRAZOLAM 0.25 MG PO TABS
0.2500 mg | ORAL_TABLET | Freq: Once | ORAL | Status: AC
  Administered 2019-09-17: 21:00:00 0.25 mg via ORAL
  Filled 2019-09-17: qty 1

## 2019-09-17 NOTE — Progress Notes (Signed)
Echocardiogram 2D Echocardiogram has been performed.  Oneal Deputy Saharsh Sterling 09/17/2019, 2:03 PM

## 2019-09-17 NOTE — ED Notes (Signed)
Patient ambulated to restroom with x2 assist.

## 2019-09-17 NOTE — Progress Notes (Signed)
Patient remains alert and confused, not easily to be reoriented, need constant reinforcement; has frequent trip to the bathroom need highly assistant; pt doesn't know how to seek assistance; has poor safety awareness and high risk for fall; provider on call paged with order for telesitter; spoke to sharon and wil call me back at 0030 if telesitter will be available; bed alarm activated, mat on floor placed; will monitor.

## 2019-09-17 NOTE — ED Notes (Signed)
Pt assisted to the bathroom. Gait unsteady, wheelchair used for assistance.

## 2019-09-17 NOTE — Progress Notes (Signed)
Patient is alert, confused and agitated, not following direction at this time; trying to get out in bed with out asking assistance; family at bedside but pt is very handful to manage; paged provider on call; xanax given with some relief; will cont to monitor.

## 2019-09-17 NOTE — Progress Notes (Signed)
PROGRESS NOTE    Caroline Cooper  [id number] DOB: Dec 02, 1935 DOA: 09/16/2019 PCP: Charlott Rakes, MD   Brief Narrative:  84 year old with past medical history of essential hypertension, hyperlipidemia, diabetes mellitus type 2, diverticulosis came to the hospital with complaints of bilateral feet swelling after 3 days of desmopressin.  Found to be hyponatremic with sodium of 119.   Assessment & Plan:   Principal Problem:   Hyponatremia Active Problems:   HTN (hypertension)   Diabetes mellitus without complication (HCC)   Hyperlipidemia  Severe hyponatremia, 119 Bilateral lower extremity swelling -Secondary to use of desmopressin.  Will discontinue rest -Urine electrolytes reviewed -Normal saline 75 cc/h.  BMP every 6 hours -UA is negative for infection or glycosuria  Essential hypertension -On Coreg 25 mg twice daily, hydralazine 25 mg twice daily, losartan 100 mg daily  Hyperlipidemia -Lipitor 40 mg at bedtime   DVT prophylaxis: Geralynn Ochs Code Status: Full code Family Communication: Daughter at bedside Disposition Plan: Maintain hospital stay until hyponatremia is improved.  This morning is 120, unsafe for discharge.  Consultants:   None  Procedures:   None  Antimicrobials:   None   Subjective: No acute events overnight.  Patient overall tells me she feels better this morning.  Spoke with the patient's daughter and her primary care provider Dr. Sharlett Iles.  Dr. Sharlett Iles informed me that she was started on desmopressin due to polyuria and plan was to repeat lab the following week.  Her desmopressin was started on 09/13/2019.  Review of Systems Otherwise negative except as per HPI, including: General: Denies fever, chills, night sweats or unintended weight loss. Resp: Denies cough, wheezing, shortness of breath. Cardiac: Denies chest pain, palpitations, orthopnea, paroxysmal nocturnal dyspnea. GI: Denies abdominal pain, nausea, vomiting, diarrhea  or constipation GU: Denies dysuria, frequency, hesitancy or incontinence MS: Denies muscle aches, joint pain or swelling Neuro: Denies headache, neurologic deficits (focal weakness, numbness, tingling), abnormal gait Psych: Denies anxiety, depression, SI/HI/AVH Skin: Denies new rashes or lesions ID: Denies sick contacts, exotic exposures, travel  Objective: Vitals:   09/17/19 0530 09/17/19 0600 09/17/19 0630 09/17/19 0700  BP: (!) 156/71 140/69 (!) 142/114 138/70  Pulse: 65 61 61 (!) 59  Resp: (!) 22 (!) 22 17 17   Temp:      TempSrc:      SpO2: 98% 98% 98% 97%   No intake or output data in the 24 hours ending 09/17/19 0802 There were no vitals filed for this visit.  Examination:  General exam: Appears calm and comfortable  Respiratory system: Clear to auscultation. Respiratory effort normal. Cardiovascular system: S1 & S2 heard, RRR. No JVD, murmurs, rubs, gallops or clicks. No pedal edema. Gastrointestinal system: Abdomen is nondistended, soft and nontender. No organomegaly or masses felt. Normal bowel sounds heard. Central nervous system: Alert and oriented. No focal neurological deficits. Extremities: Symmetric 5 x 5 power. Skin: No rashes, lesions or ulcers Psychiatry: Judgement and insight appear normal. Mood & affect appropriate.     Data Reviewed:   CBC: Recent Labs  Lab 09/16/19 1913 09/17/19 0154  WBC 8.0 8.9  NEUTROABS 5.1  --   HGB 12.9 12.3  HCT 36.4 34.7*  MCV 87.9 87.6  PLT 155 0000000*   Basic Metabolic Panel: Recent Labs  Lab 09/16/19 1913 09/17/19 0115 09/17/19 0154  NA 119* 122* 119*  K 3.9 3.5 3.7  CL 85* 90* 86*  CO2 23 22 22   GLUCOSE 110* 111* 115*  BUN 12 11 11   CREATININE 0.70 0.62  0.67  CALCIUM 8.6* 8.2* 8.3*   GFR: CrCl cannot be calculated (Unknown ideal weight.). Liver Function Tests: Recent Labs  Lab 09/17/19 0154  AST 30  ALT 16  ALKPHOS 79  BILITOT 1.8*  PROT 7.0  ALBUMIN 3.8   No results for input(s): LIPASE,  AMYLASE in the last 168 hours. No results for input(s): AMMONIA in the last 168 hours. Coagulation Profile: No results for input(s): INR, PROTIME in the last 168 hours. Cardiac Enzymes: No results for input(s): CKTOTAL, CKMB, CKMBINDEX, TROPONINI in the last 168 hours. BNP (last 3 results) No results for input(s): PROBNP in the last 8760 hours. HbA1C: No results for input(s): HGBA1C in the last 72 hours. CBG: Recent Labs  Lab 09/17/19 0145  GLUCAP 124*   Lipid Profile: No results for input(s): CHOL, HDL, LDLCALC, TRIG, CHOLHDL, LDLDIRECT in the last 72 hours. Thyroid Function Tests: Recent Labs    09/16/19 2345  TSH 1.643   Anemia Panel: No results for input(s): VITAMINB12, FOLATE, FERRITIN, TIBC, IRON, RETICCTPCT in the last 72 hours. Sepsis Labs: No results for input(s): PROCALCITON, LATICACIDVEN in the last 168 hours.  No results found for this or any previous visit (from the past 240 hour(s)).       Radiology Studies: No results found.      Scheduled Meds: . enoxaparin (LOVENOX) injection  40 mg Subcutaneous Q24H   Continuous Infusions: . sodium chloride 60 mL/hr at 09/17/19 0122     LOS: 1 day   Time spent= 40 mins    Pasqual Farias Arsenio Loader, MD [facility]  If 7PM-7AM, please contact night-coverage  09/17/2019, 8:02 AM

## 2019-09-18 LAB — BASIC METABOLIC PANEL
Anion gap: 9 (ref 5–15)
Anion gap: 8 (ref 5–15)
Anion gap: 7 (ref 5–15)
Anion gap: 7 (ref 5–15)
BUN: 14 mg/dL (ref 8–23)
BUN: 11 mg/dL (ref 8–23)
BUN: 11 mg/dL (ref 8–23)
BUN: 13 mg/dL (ref 8–23)
CO2: 24 mmol/L (ref 22–32)
CO2: 24 mmol/L (ref 22–32)
CO2: 23 mmol/L (ref 22–32)
CO2: 25 mmol/L (ref 22–32)
Calcium: 8.2 mg/dL — ABNORMAL LOW (ref 8.9–10.3)
Calcium: 8.5 mg/dL — ABNORMAL LOW (ref 8.9–10.3)
Calcium: 8.6 mg/dL — ABNORMAL LOW (ref 8.9–10.3)
Calcium: 8.6 mg/dL — ABNORMAL LOW (ref 8.9–10.3)
Chloride: 93 mmol/L — ABNORMAL LOW (ref 98–111)
Chloride: 99 mmol/L (ref 98–111)
Chloride: 102 mmol/L (ref 98–111)
Chloride: 106 mmol/L (ref 98–111)
Creatinine, Ser: 0.77 mg/dL (ref 0.44–1.00)
Creatinine, Ser: 0.79 mg/dL (ref 0.44–1.00)
Creatinine, Ser: 0.82 mg/dL (ref 0.44–1.00)
Creatinine, Ser: 0.77 mg/dL (ref 0.44–1.00)
GFR calc Af Amer: >60 mL/min (ref >60)
GFR calc Af Amer: >60 mL/min (ref >60)
GFR calc Af Amer: >60 mL/min (ref >60)
GFR calc Af Amer: >60 mL/min (ref >60)
GFR calc non Af Amer: >60 mL/min (ref >60)
GFR calc non Af Amer: >60 mL/min (ref >60)
GFR calc non Af Amer: >60 mL/min (ref >60)
GFR calc non Af Amer: >60 mL/min (ref >60)
Glucose, Bld: 104 mg/dL — ABNORMAL HIGH (ref 70–99)
Glucose, Bld: 110 mg/dL — ABNORMAL HIGH (ref 70–99)
Glucose, Bld: 133 mg/dL — ABNORMAL HIGH (ref 70–99)
Glucose, Bld: 143 mg/dL — ABNORMAL HIGH (ref 70–99)
Potassium: 3.2 mmol/L — ABNORMAL LOW (ref 3.5–5.1)
Potassium: 3.9 mmol/L (ref 3.5–5.1)
Potassium: 4.3 mmol/L (ref 3.5–5.1)
Potassium: 4.2 mmol/L (ref 3.5–5.1)
Sodium: 126 mmol/L — ABNORMAL LOW (ref 135–145)
Sodium: 131 mmol/L — ABNORMAL LOW (ref 135–145)
Sodium: 132 mmol/L — ABNORMAL LOW (ref 135–145)
Sodium: 138 mmol/L (ref 135–145)

## 2019-09-18 LAB — CK: Total CK: 759 U/L — ABNORMAL HIGH (ref 38–234)

## 2019-09-18 MED ORDER — POTASSIUM CHLORIDE CRYS ER 20 MEQ PO TBCR
40.0000 meq | EXTENDED_RELEASE_TABLET | Freq: Once | ORAL | Status: AC
  Administered 2019-09-18: 11:00:00 40 meq via ORAL
  Filled 2019-09-18: qty 2

## 2019-09-18 MED ORDER — POTASSIUM CHLORIDE CRYS ER 20 MEQ PO TBCR
40.0000 meq | EXTENDED_RELEASE_TABLET | Freq: Once | ORAL | Status: AC
  Administered 2019-09-18: 05:00:00 40 meq via ORAL
  Filled 2019-09-18: qty 2

## 2019-09-18 NOTE — Progress Notes (Signed)
PROGRESS NOTE    Caroline Cooper  [id number] DOB: [birth year] DOA: 09/16/2019 PCP: Charlott Rakes, MD   Brief Narrative:  84 year old with past medical history of essential hypertension, hyperlipidemia, diabetes mellitus type 2, diverticulosis came to the hospital with complaints of bilateral feet swelling after 3 days of desmopressin.  Found to be hyponatremic with sodium of 119.   Assessment & Plan:   Principal Problem:   Hyponatremia Active Problems:   HTN (hypertension)   Diabetes mellitus without complication (HCC)   Hyperlipidemia  Severe hyponatremia, 119 Bilateral lower extremity swelling -Secondary to desmopressin use.  Improved to 126. -Urine electrolytes reviewed -Continue normal saline 75 cc/h -UA is negative for infection or glycosuria -Echocardiogram-ejection fraction 65% otherwise normal.  Hypokalemia -Repletion ordered  Essential hypertension -On Coreg 25 mg twice daily, hydralazine 25 mg twice daily, losartan 100 mg daily  Hyperlipidemia -Lipitor 40 mg at bedtime   DVT prophylaxis: Lovenox Code Status: Full code Family Communication: Per the daughter over the phone Disposition Plan: Hospital stay for IV fluids and to ensure her potassium has stabilized and remained normal.  Unsafe for discharge.  Consultants:   None  Procedures:   None  Antimicrobials:   None   Subjective: Overnight had episodes of confusion and was delirious.  Telemetry sitter was ordered for safety reasons. This morning she is tearful as her family is not around but overall tells me he feels better.  I spoke with her daughter over the phone while I was in the room who was on her way to the hospital.  All the questions were answered.  Review of Systems Otherwise negative except as per HPI, including: General = no fevers, chills, dizziness, malaise, fatigue HEENT/EYES = negative for pain, redness, loss of vision, double vision, blurred vision, loss of  hearing, sore throat, hoarseness, dysphagia Cardiovascular= negative for chest pain, palpitation, murmurs, lower extremity swelling Respiratory/lungs= negative for shortness of breath, cough, hemoptysis, wheezing, mucus production Gastrointestinal= negative for nausea, vomiting,, abdominal pain, melena, hematemesis Genitourinary= negative for Dysuria, Hematuria, Change in Urinary Frequency MSK = Negative for arthralgia, myalgias, Back Pain, Joint swelling  Neurology= Negative for headache, seizures, numbness, tingling  Psychiatry= Negative for anxiety, depression, suicidal and homocidal ideation Allergy/Immunology= Medication/Food allergy as listed  Skin= Negative for Rash, lesions, ulcers, itching  Objective: Vitals:   09/17/19 0700 09/17/19 1259 09/17/19 2132 09/18/19 0500  BP: 138/70 129/81 (!) 150/85 (!) 133/56  Pulse: (!) 59 69 61 67  Resp: 17 18 19 18   Temp:  98.2 F (36.8 C) 98 F (36.7 C) 98.5 F (36.9 C)  TempSrc:  Oral Oral Oral  SpO2: 97% 98% 100% 97%  Weight:    76.6 kg    Intake/Output Summary (Last 24 hours) at 09/18/2019 1029 Last data filed at 09/18/2019 0527 Gross per 24 hour  Intake 1280 ml  Output -  Net 1280 ml   Filed Weights   09/18/19 0500  Weight: 76.6 kg    Examination:  Constitutional: Tearful but comfortable Eyes: PERRL, lids and conjunctivae normal ENMT: Mucous membranes are moist. Posterior pharynx clear of any exudate or lesions.Normal dentition.  Neck: normal, supple, no masses, no thyromegaly Respiratory: clear to auscultation bilaterally, no wheezing, no crackles. Normal respiratory effort. No accessory muscle use.  Cardiovascular: Regular rate and rhythm, no murmurs / rubs / gallops. No extremity edema. 2+ pedal pulses. No carotid bruits.  Abdomen: no tenderness, no masses palpated. No hepatosplenomegaly. Bowel sounds positive.  Musculoskeletal: no clubbing / cyanosis. No joint  deformity upper and lower extremities. Good ROM, no  contractures. Normal muscle tone.  Skin: no rashes, lesions, ulcers. No induration Neurologic: CN 2-12 grossly intact. Sensation intact, DTR normal. Strength 5/5 in all 4.  Psychiatric: Alert awake oriented extremely tearful     Data Reviewed:   CBC: Recent Labs  Lab 09/16/19 1913 09/17/19 0154  WBC 8.0 8.9  NEUTROABS 5.1  --   HGB 12.9 12.3  HCT 36.4 34.7*  MCV 87.9 87.6  PLT 155 0000000*   Basic Metabolic Panel: Recent Labs  Lab 09/17/19 0154 09/17/19 0825 09/17/19 1509 09/17/19 2203 09/18/19 0343  NA 119* 120* 120* 122* 126*  K 3.7 3.6 3.3* 3.8 3.2*  CL 86* 85* 85* 88* 93*  CO2 22 22 23 24 24   GLUCOSE 115* 142* 114* 118* 104*  BUN 11 11 12 15 14   CREATININE 0.67 0.73 0.74 0.89 0.77  CALCIUM 8.3* 8.4* 8.3* 8.4* 8.2*   GFR: CrCl cannot be calculated (Unknown ideal weight.). Liver Function Tests: Recent Labs  Lab 09/17/19 0154  AST 30  ALT 16  ALKPHOS 79  BILITOT 1.8*  PROT 7.0  ALBUMIN 3.8   No results for input(s): LIPASE, AMYLASE in the last 168 hours. No results for input(s): AMMONIA in the last 168 hours. Coagulation Profile: No results for input(s): INR, PROTIME in the last 168 hours. Cardiac Enzymes: Recent Labs  Lab 09/18/19 0343  CKTOTAL 759*   BNP (last 3 results) No results for input(s): PROBNP in the last 8760 hours. HbA1C: No results for input(s): HGBA1C in the last 72 hours. CBG: Recent Labs  Lab 09/17/19 0145  GLUCAP 124*   Lipid Profile: No results for input(s): CHOL, HDL, LDLCALC, TRIG, CHOLHDL, LDLDIRECT in the last 72 hours. Thyroid Function Tests: Recent Labs    09/16/19 2345  TSH 1.643   Anemia Panel: No results for input(s): VITAMINB12, FOLATE, FERRITIN, TIBC, IRON, RETICCTPCT in the last 72 hours. Sepsis Labs: No results for input(s): PROCALCITON, LATICACIDVEN in the last 168 hours.  Recent Results (from the past 240 hour(s))  Urine culture     Status: None   Collection Time: 09/16/19  8:50 PM   Specimen:  Urine, Clean Catch  Result Value Ref Range Status   Specimen Description   Final    URINE, CLEAN CATCH Performed at [facility], [place] [address] [place], [place] [address]    Special Requests   Final    NONE Performed at [facility], [place] [address], [place], [place] [address]    Culture   Final    NO GROWTH Performed at [facility], [place] [address] [place],  [address]    Report Status 09/17/2019 FINAL  Final  SARS CORONAVIRUS 2 (TAT 6-24 HRS) Nasopharyngeal Nasopharyngeal Swab     Status: None   Collection Time: 09/16/19 11:25 PM   Specimen: Nasopharyngeal Swab  Result Value Ref Range Status   SARS Coronavirus 2 NEGATIVE NEGATIVE Final    Comment: (NOTE) SARS-CoV-2 target nucleic acids are NOT DETECTED. The SARS-CoV-2 RNA is generally detectable in upper and lower respiratory specimens during the acute phase of infection. Negative results do not preclude SARS-CoV-2 infection, do not rule out co-infections with other pathogens, and should not be used as the sole basis for treatment or other patient management decisions. Negative results must be combined with clinical observations, patient history, and epidemiological information. The expected result is Negative. Fact Sheet for Patients: SugarRoll.be Fact Sheet for Healthcare Providers: https://www.woods-mathews.com/  This test is not yet approved or cleared by the [place] and  has been authorized for detection and/or diagnosis of SARS-CoV-2 by FDA under an Emergency Use Authorization (EUA). This EUA will remain  in effect (meaning this test can be used) for the duration of the COVID-19 declaration under Section 56 4(b)(1) of the Act, 21 U.S.C. section 360bbb-3(b)(1), unless the authorization is terminated or revoked sooner. Performed at [facility], [place] [address] [place],  [address]           Radiology Studies: No results found.      Scheduled Meds: . atorvastatin  40 mg Oral Daily  . carvedilol  25 mg Oral BID WC  . enoxaparin (LOVENOX) injection  40 mg Subcutaneous Q24H  . hydrALAZINE  25 mg Oral BID  . losartan  100 mg Oral Daily  . potassium chloride  40 mEq Oral Once   Continuous Infusions: . sodium chloride 75 mL/hr at 09/18/19 0500     LOS: 2 days   Time spent= 25 mins    Kelsey Edman Arsenio Loader, MD [facility]  If 7PM-7AM, please contact night-coverage  09/18/2019, 10:29 AM

## 2019-09-18 NOTE — Progress Notes (Signed)
Pt is placed on [employer] telesitter. Will monitor

## 2019-09-19 LAB — BASIC METABOLIC PANEL
Anion gap: 7 (ref 5–15)
BUN: 12 mg/dL (ref 8–23)
CO2: 25 mmol/L (ref 22–32)
Calcium: 8.4 mg/dL — ABNORMAL LOW (ref 8.9–10.3)
Chloride: 106 mmol/L (ref 98–111)
Creatinine, Ser: 0.82 mg/dL (ref 0.44–1.00)
GFR calc Af Amer: >60 mL/min (ref >60)
GFR calc non Af Amer: >60 mL/min (ref >60)
Glucose, Bld: 99 mg/dL (ref 70–99)
Potassium: 4.2 mmol/L (ref 3.5–5.1)
Sodium: 138 mmol/L (ref 135–145)

## 2019-09-19 NOTE — Plan of Care (Signed)

## 2019-09-19 NOTE — Discharge Summary (Signed)
Physician Discharge Summary  Caroline Cooper [name] WUJ:[id number] DOB: [birth year] DOA: 09/16/2019  PCP: Charlott Rakes, MD  Admit date: 09/16/2019 Discharge date: 09/19/2019  Admitted From: Home Disposition: Home  Recommendations for Outpatient Follow-up:  1. Follow up with PCP in 1-2 weeks 2. Please obtain BMP/CBC in one week your next doctors visit.  3. Discontinue desmopressin   Discharge Condition: Stable CODE STATUS: Full code Diet recommendation: Diabetic diet  Brief/Interim Summary: 84 year old with past medical history of essential hypertension, hyperlipidemia, diabetes mellitus type 2, diverticulosis came to the hospital with complaints of bilateral feet swelling after 3 days of desmopressin.  Found to be hyponatremic with sodium of 119.  Her desmopressin was discontinued, she was gently hydrated.  Her sodium slowly improved and reached her baseline of 138 over the course of 48 hours. Stable for discharge today.  Discontinue desmopressin.  Met with daughter at bedside as well during discharge.  All the questions answered  Discharge Diagnoses:  Principal Problem:   Hyponatremia Active Problems:   HTN (hypertension)   Diabetes mellitus without complication (HCC)   Hyperlipidemia  Severe hyponatremia, 119 Bilateral lower extremity swelling -UA is negative.  Discontinue desmopressin.  Sodium improved 238.  Discontinue IV fluids. -Echocardiogram ejection fraction 65%   Hypokalemia -Repletion ordered  Essential hypertension -On Coreg 25 mg twice daily, hydralazine 25 mg twice daily, losartan 100 mg daily  Hyperlipidemia -Lipitor 40 mg at bedtime  Consultations:  None  Subjective: No complaints.  Daughter at bedside all the questions answered.  Discharge Exam: Vitals:   09/18/19 2025 09/19/19 0349  BP: (!) 122/47 (!) 155/59  Pulse: (!) 55 (!) 59  Resp: 20 18  Temp: 98.4 F (36.9 C) 98.6 F (37 C)  SpO2: 98% 98%   Vitals:   09/18/19 1046  09/18/19 1827 09/18/19 2025 09/19/19 0349  BP: (!) 174/79 (!) 152/63 (!) 122/47 (!) 155/59  Pulse: 60 66 (!) 55 (!) 59  Resp:   20 18  Temp:   98.4 F (36.9 C) 98.6 F (37 C)  TempSrc:   Oral Oral  SpO2: 99% 98% 98% 98%  Weight:    75.5 kg    General: Pt is alert, awake, not in acute distress Cardiovascular: RRR, S1/S2 +, no rubs, no gallops Respiratory: CTA bilaterally, no wheezing, no rhonchi Abdominal: Soft, NT, ND, bowel sounds + Extremities: no edema, no cyanosis  Discharge Instructions   Allergies as of 09/19/2019      Reactions   Penicillins Rash   Has patient had a PCN reaction causing immediate rash, facial/tongue/throat swelling, SOB or lightheadedness with hypotension: No Has patient had a PCN reaction causing severe rash involving mucus membranes or skin necrosis: No Has patient had a PCN reaction that required hospitalization No Has patient had a PCN reaction occurring within the last 10 years: No If all of the above answers are "NO", then may proceed with Cephalosporin use.      Medication List    STOP taking these medications   desmopressin 0.2 MG tablet Commonly known as: DDAVP   sulfamethoxazole-trimethoprim 800-160 MG tablet Commonly known as: BACTRIM DS     TAKE these medications   acetaminophen 500 MG tablet Commonly known as: TYLENOL Take 1,000 mg by mouth every 8 (eight) hours as needed for moderate pain or headache.   atorvastatin 40 MG tablet Commonly known as: LIPITOR TAKE 1 TABLET BY MOUTH EVERY DAY   carvedilol 25 MG tablet Commonly known as: COREG TAKE 1 TABLET BY MOUTH TWICE DAILY  WITH FOOD What changed:   how much to take  how to take this  when to take this  additional instructions   donepezil 10 MG tablet Commonly known as: ARICEPT TAKE 1 TABLET BY MOUTH EVERY NIGHT AT BEDTIME   hydrALAZINE 25 MG tablet Commonly known as: APRESOLINE Take 1 tablet (25 mg total) by mouth 2 (two) times daily. MUST MAKE APPT FOR FURTHER  REFILLS   losartan 100 MG tablet Commonly known as: COZAAR Take 1 tablet (100 mg total) by mouth daily. Must make appt for more refills.   olopatadine 0.1 % ophthalmic solution Commonly known as: Patanol Place 1 drop into both eyes 2 (two) times daily.      Follow-up Information    Charlott Rakes, MD. Schedule an appointment as soon as possible for a visit in 1 week(s).   Specialty: Family Medicine Contact information: [address] [place] [address] [phone]          Allergies  Allergen Reactions  . Penicillins Rash     Has patient had a PCN reaction causing immediate rash, facial/tongue/throat swelling, SOB or lightheadedness with hypotension: No Has patient had a PCN reaction causing severe rash involving mucus membranes or skin necrosis: No Has patient had a PCN reaction that required hospitalization No Has patient had a PCN reaction occurring within the last 10 years: No If all of the above answers are "NO", then may proceed with Cephalosporin use.     You were cared for by a hospitalist during your hospital stay. If you have any questions about your discharge medications or the care you received while you were in the hospital after you are discharged, you can call the unit and asked to speak with the hospitalist on call if the hospitalist that took care of you is not available. Once you are discharged, your primary care physician will handle any further medical issues. Please note that no refills for any discharge medications will be authorized once you are discharged, as it is imperative that you return to your primary care physician (or establish a relationship with a primary care physician if you do not have one) for your aftercare needs so that they can reassess your need for medications and monitor your lab values.   Procedures/Studies:  No results found.   The results of significant diagnostics from this hospitalization (including imaging,  microbiology, ancillary and laboratory) are listed below for reference.     Microbiology: Recent Results (from the past 240 hour(s))  Urine culture     Status: None   Collection Time: 09/16/19  8:50 PM   Specimen: Urine, Clean Catch  Result Value Ref Range Status   Specimen Description   Final    URINE, CLEAN CATCH Performed at [facility], [place] [address], [place], [place] [address]    Special Requests   Final    NONE Performed at [facility], [place] [address] [place], [place] [address]    Culture   Final    NO GROWTH Performed at [facility], [place] [address], [place],  [address]    Report Status 09/17/2019 FINAL  Final  SARS CORONAVIRUS 2 (TAT 6-24 HRS) Nasopharyngeal Nasopharyngeal Swab     Status: None   Collection Time: 09/16/19 11:25 PM   Specimen: Nasopharyngeal Swab  Result Value Ref Range Status   SARS Coronavirus 2 NEGATIVE NEGATIVE Final    Comment: (NOTE) SARS-CoV-2 target nucleic acids are NOT DETECTED. The SARS-CoV-2 RNA  is generally detectable in upper and lower respiratory specimens during the acute phase of infection. Negative results do not preclude SARS-CoV-2 infection, do not rule out co-infections with other pathogens, and should not be used as the sole basis for treatment or other patient management decisions. Negative results must be combined with clinical observations, patient history, and epidemiological information. The expected result is Negative. Fact Sheet for Patients: SugarRoll.be Fact Sheet for Healthcare Providers: https://www.woods-mathews.com/ This test is not yet approved or cleared by the [place] FDA and  has been authorized for detection and/or diagnosis of SARS-CoV-2 by FDA under an Emergency Use Authorization (EUA). This EUA will remain  in effect (meaning this test can be used) for the duration of the COVID-19 declaration under Section  56 4(b)(1) of the Act, 21 U.S.C. section 360bbb-3(b)(1), unless the authorization is terminated or revoked sooner. Performed at [facility], [place] [address], [place], [place] [address]      Labs: BNP (last 3 results) No results for input(s): BNP in the last 8760 hours. Basic Metabolic Panel: Recent Labs  Lab 09/18/19 0343 09/18/19 1010 09/18/19 1414 09/18/19 2017 09/19/19 0236  NA 126* 131* 132* 138 138  K 3.2* 3.9 4.3 4.2 4.2  CL 93* 99 102 106 106  CO2 24 24 23 25 25   GLUCOSE 104* 110* 133* 143* 99  BUN 14 11 11 13 12   CREATININE 0.77 0.79 0.82 0.77 0.82  CALCIUM 8.2* 8.5* 8.6* 8.6* 8.4*   Liver Function Tests: Recent Labs  Lab 09/17/19 0154  AST 30  ALT 16  ALKPHOS 79  BILITOT 1.8*  PROT 7.0  ALBUMIN 3.8   No results for input(s): LIPASE, AMYLASE in the last 168 hours. No results for input(s): AMMONIA in the last 168 hours. CBC: Recent Labs  Lab 09/16/19 1913 09/17/19 0154  WBC 8.0 8.9  NEUTROABS 5.1  --   HGB 12.9 12.3  HCT 36.4 34.7*  MCV 87.9 87.6  PLT 155 138*   Cardiac Enzymes: Recent Labs  Lab 09/18/19 0343  CKTOTAL 759*   BNP: Invalid input(s): POCBNP CBG: Recent Labs  Lab 09/17/19 0145  GLUCAP 124*   D-Dimer No results for input(s): DDIMER in the last 72 hours. Hgb A1c No results for input(s): HGBA1C in the last 72 hours. Lipid Profile No results for input(s): CHOL, HDL, LDLCALC, TRIG, CHOLHDL, LDLDIRECT in the last 72 hours. Thyroid function studies Recent Labs    09/16/19 2345  TSH 1.643   Anemia work up No results for input(s): VITAMINB12, FOLATE, FERRITIN, TIBC, IRON, RETICCTPCT in the last 72 hours. Urinalysis    Component Value Date/Time   COLORURINE YELLOW 09/16/2019 1852   APPEARANCEUR CLEAR 09/16/2019 1852   LABSPEC 1.011 09/16/2019 1852   PHURINE 6.0 09/16/2019 1852   GLUCOSEU NEGATIVE 09/16/2019 1852   HGBUR MODERATE (A) 09/16/2019 1852   BILIRUBINUR NEGATIVE 09/16/2019 1852   BILIRUBINUR negative  01/26/2019 1657   BILIRUBINUR small 06/03/2018 1001   [place] 09/16/2019 1852   PROTEINUR 30 (A) 09/16/2019 1852   UROBILINOGEN 0.2 01/26/2019 1657   UROBILINOGEN 0.2 09/24/2015 0833   NITRITE NEGATIVE 09/16/2019 1852   LEUKOCYTESUR NEGATIVE 09/16/2019 1852   Sepsis Labs Invalid input(s): PROCALCITONIN,  WBC,  LACTICIDVEN Microbiology Recent Results (from the past 240 hour(s))  Urine culture     Status: None   Collection Time: 09/16/19  8:50 PM   Specimen: Urine, Clean Catch  Result Value Ref Range Status   Specimen Description   Final  URINE, CLEAN CATCH Performed at [facility], [place] [address], [place], [place] [address]    Special Requests   Final    NONE Performed at [facility], [place] [address], [place], [place] [address]    Culture   Final    NO GROWTH Performed at [facility], [place] [address] [place], [place] [address]    Report Status 09/17/2019 FINAL  Final  SARS CORONAVIRUS 2 (TAT 6-24 HRS) Nasopharyngeal Nasopharyngeal Swab     Status: None   Collection Time: 09/16/19 11:25 PM   Specimen: Nasopharyngeal Swab  Result Value Ref Range Status   SARS Coronavirus 2 NEGATIVE NEGATIVE Final    Comment: (NOTE) SARS-CoV-2 target nucleic acids are NOT DETECTED. The SARS-CoV-2 RNA is generally detectable in upper and lower respiratory specimens during the acute phase of infection. Negative results do not preclude SARS-CoV-2 infection, do not rule out co-infections with other pathogens, and should not be used as the sole basis for treatment or other patient management decisions. Negative results must be combined with clinical observations, patient history, and epidemiological information. The expected result is Negative. Fact Sheet for Patients: SugarRoll.be Fact Sheet for Healthcare Providers: https://www.woods-mathews.com/ This test is not yet approved or cleared by the  [place] FDA and  has been authorized for detection and/or diagnosis of SARS-CoV-2 by FDA under an Emergency Use Authorization (EUA). This EUA will remain  in effect (meaning this test can be used) for the duration of the COVID-19 declaration under Section 56 4(b)(1) of the Act, 21 U.S.C. section 360bbb-3(b)(1), unless the authorization is terminated or revoked sooner. Performed at [facility], [place] [address], [place], [place] [address]      Time coordinating discharge:  I have spent 35 minutes face to face with the patient and on the ward discussing the patients care, assessment, plan and disposition with other care givers. >50% of the time was devoted counseling the patient about the risks and benefits of treatment/Discharge disposition and coordinating care.   SIGNED:   Damita Lack, MD  [facility] 09/19/2019, 10:02 AM   If 7PM-7AM, please contact night-coverage

## 2019-09-19 NOTE — Progress Notes (Signed)
Hoberg to be D/C'd Home per MD order.  Discussed prescriptions and follow up appointments with the patient. Prescriptions given to patient, medication list explained in detail. Pt verbalized understanding.  Allergies as of 09/19/2019      Reactions   Penicillins Rash   Has patient had a PCN reaction causing immediate rash, facial/tongue/throat swelling, SOB or lightheadedness with hypotension: No Has patient had a PCN reaction causing severe rash involving mucus membranes or skin necrosis: No Has patient had a PCN reaction that required hospitalization No Has patient had a PCN reaction occurring within the last 10 years: No If all of the above answers are "NO", then may proceed with Cephalosporin use.      Medication List    STOP taking these medications   desmopressin 0.2 MG tablet Commonly known as: DDAVP   sulfamethoxazole-trimethoprim 800-160 MG tablet Commonly known as: BACTRIM DS     TAKE these medications   acetaminophen 500 MG tablet Commonly known as: TYLENOL Take 1,000 mg by mouth every 8 (eight) hours as needed for moderate pain or headache.   atorvastatin 40 MG tablet Commonly known as: LIPITOR TAKE 1 TABLET BY MOUTH EVERY DAY   carvedilol 25 MG tablet Commonly known as: COREG TAKE 1 TABLET BY MOUTH TWICE DAILY WITH FOOD What changed:   how much to take  how to take this  when to take this  additional instructions   donepezil 10 MG tablet Commonly known as: ARICEPT TAKE 1 TABLET BY MOUTH EVERY NIGHT AT BEDTIME   hydrALAZINE 25 MG tablet Commonly known as: APRESOLINE Take 1 tablet (25 mg total) by mouth 2 (two) times daily. MUST MAKE APPT FOR FURTHER REFILLS   losartan 100 MG tablet Commonly known as: COZAAR Take 1 tablet (100 mg total) by mouth daily. Must make appt for more refills.   olopatadine 0.1 % ophthalmic solution Commonly known as: Patanol Place 1 drop into both eyes 2 (two) times daily.       Vitals:   09/18/19  2025 09/19/19 0349  BP: (!) 122/47 (!) 155/59  Pulse: (!) 55 (!) 59  Resp: 20 18  Temp: 98.4 F (36.9 C) 98.6 F (37 C)  SpO2: 98% 98%    Skin clean, dry and intact without evidence of skin break down, no evidence of skin tears noted. IV catheter discontinued intact. Site without signs and symptoms of complications. Dressing and pressure applied. Pt denies pain at this time. No complaints noted.  An After Visit Summary was printed and given to the patient. Patient escorted via [place], and D/C home via private auto.  Nonie Hoyer S 09/19/2019 9:24 AM

## 2019-09-21 NOTE — Progress Notes (Signed)
Patient ID: Caroline Cooper, female   DOB: 22-Apr-1935, 84 y.o.   MRN: [id number] Virtual Visit via Telephone Note  I connected with Prichard on 09/22/19 at  9:50 AM EDT by telephone and verified that I am speaking with the correct person using two identifiers.   I discussed the limitations, risks, security and privacy concerns of performing an evaluation and management service by telephone and the availability of in person appointments. I also discussed with the patient that there may be a patient responsible charge related to this service. The patient expressed understanding and agreed to proceed.  Patient location:  In car driving to office My Location:  home office Persons on the call:  Me and the patient and her daughter Watt Climes  History of Present Illness: After hospitalization 10/15-10/18/2020 for feet swelling and hyponatremia after taking desmopressin.  She is doing well other than increased urination at night.  Denies dysuria.  Energy levels seem back to baseline.  Swelling of legs has improved.  Needs RF of meds.  Denies CP/SOB.  No fever.    From discharge summary: 84 year old with past medical history of essential hypertension, hyperlipidemia, diabetes mellitus type 2, diverticulosis came to the hospital with complaints of bilateral feet swelling after 3 days of desmopressin. Found to be hyponatremic with sodium of 119.  Her desmopressin was discontinued, she was gently hydrated.  Her sodium slowly improved and reached her baseline of 138 over the course of 48 hours. Stable for discharge today.  Discontinue desmopressin.  Met with daughter at bedside as well during discharge.  All the questions answered  Discharge Diagnoses:  Principal Problem:   Hyponatremia Active Problems:   HTN (hypertension)   Diabetes mellitus without complication (HCC)   Hyperlipidemia  Severe hyponatremia, 119 Bilateral lower extremity swelling -UA is negative.   Discontinue desmopressin.  Sodium improved 238.  Discontinue IV fluids. -Echocardiogram ejection fraction 65%   Hypokalemia -Repletion ordered  Essential hypertension -On Coreg 25 mg twice daily, hydralazine 25 mg twice daily, losartan 100 mg daily  Hyperlipidemia -Lipitor 40 mg at bedtime  Consultations:  None  Subjective: No complaints.  Daughter at bedside all the questions answered.    Observations/Objective:  A&Ox3   Assessment and Plan:   1. Urinary frequency - Urine Culture - POCT URINALYSIS DIP (CLINITEK)  2. Hyponatremia - Comprehensive metabolic panel  3. Thrombocytopenia (HCC) - CBC with Differential/Platelet  4. Hospital discharge follow-up  5. Essential hypertension  - losartan (COZAAR) 100 MG tablet; Take 1 tablet (100 mg total) by mouth daily.  Dispense: 30 tablet; Refill: 3 - hydrALAZINE (APRESOLINE) 25 MG tablet; Take 1 tablet (25 mg total) by mouth 2 (two) times daily.  Dispense: 60 tablet; Refill: 3 - carvedilol (COREG) 25 MG tablet; Take 1 tablet (25 mg total) by mouth 2 (two) times daily with a meal.  Dispense: 60 tablet; Refill: 3  6. Pure hypercholesterolemia - atorvastatin (LIPITOR) 40 MG tablet; Take 1 tablet (40 mg total) by mouth daily.  Dispense: 30 tablet; Refill: 3    Follow Up Instructions: 10/20/2019 with PCP   I discussed the assessment and treatment plan with the patient. The patient was provided an opportunity to ask questions and all were answered. The patient agreed with the plan and demonstrated an understanding of the instructions.   The patient was advised to call back or seek an in-person evaluation if the symptoms worsen or if the condition fails to improve as anticipated.  I provided 13 minutes  of non-face-to-face time during this encounter.   Freeman Caldron, PA-C

## 2019-09-22 ENCOUNTER — Ambulatory Visit: Payer: Medicare Other

## 2019-09-22 ENCOUNTER — Ambulatory Visit: Payer: Medicare Other | Attending: Family Medicine | Admitting: Physician Assistant

## 2019-09-22 ENCOUNTER — Other Ambulatory Visit: Payer: Self-pay

## 2019-09-22 DIAGNOSIS — I10 Essential (primary) hypertension: Secondary | ICD-10-CM | POA: Diagnosis not present

## 2019-09-22 DIAGNOSIS — E871 Hypo-osmolality and hyponatremia: Secondary | ICD-10-CM

## 2019-09-22 DIAGNOSIS — Z09 Encounter for follow-up examination after completed treatment for conditions other than malignant neoplasm: Secondary | ICD-10-CM | POA: Diagnosis not present

## 2019-09-22 DIAGNOSIS — D696 Thrombocytopenia, unspecified: Secondary | ICD-10-CM | POA: Diagnosis not present

## 2019-09-22 DIAGNOSIS — R350 Frequency of micturition: Secondary | ICD-10-CM

## 2019-09-22 DIAGNOSIS — E7800 Pure hypercholesterolemia, unspecified: Secondary | ICD-10-CM

## 2019-09-22 LAB — POCT URINALYSIS DIP (CLINITEK)
Bilirubin, UA: NEGATIVE
Glucose, UA: NEGATIVE mg/dL
Ketones, POC UA: NEGATIVE mg/dL
Nitrite, UA: NEGATIVE
POC PROTEIN,UA: 30 — AB
Spec Grav, UA: 1.02 (ref 1.010–1.025)
Urobilinogen, UA: 1 E.U./dL
pH, UA: 7 (ref 5.0–8.0)

## 2019-09-22 MED ORDER — CARVEDILOL 25 MG PO TABS: 25.0000 mg | ORAL_TABLET | Freq: Two times a day (BID) | ORAL | 3 refills | Status: DC

## 2019-09-22 MED ORDER — LOSARTAN POTASSIUM 100 MG PO TABS: 100.0000 mg | ORAL_TABLET | Freq: Every day | ORAL | 3 refills | Status: DC

## 2019-09-22 MED ORDER — SULFAMETHOXAZOLE-TRIMETHOPRIM 800-160 MG PO TABS: 1.0000 | ORAL_TABLET | Freq: Two times a day (BID) | ORAL | 0 refills | Status: DC

## 2019-09-22 MED ORDER — ATORVASTATIN CALCIUM 40 MG PO TABS: 40.0000 mg | ORAL_TABLET | Freq: Every day | ORAL | 3 refills | Status: DC

## 2019-09-22 MED ORDER — HYDRALAZINE HCL 25 MG PO TABS: 25.0000 mg | ORAL_TABLET | Freq: Two times a day (BID) | ORAL | 3 refills | Status: DC

## 2019-09-24 LAB — URINE CULTURE

## 2019-09-25 LAB — COMPREHENSIVE METABOLIC PANEL
ALT: 15 IU/L (ref 0–32)
AST: 22 IU/L (ref 0–40)
Albumin/Globulin Ratio: 1.8 (ref 1.2–2.2)
Albumin: 4.4 g/dL (ref 3.6–4.6)
Alkaline Phosphatase: 92 IU/L (ref 39–117)
BUN/Creatinine Ratio: 12 (ref 12–28)
BUN: 12 mg/dL (ref 8–27)
Bilirubin Total: 0.7 mg/dL (ref 0.0–1.2)
CO2: 26 mmol/L (ref 20–29)
Calcium: 9.5 mg/dL (ref 8.7–10.3)
Chloride: 98 mmol/L (ref 96–106)
Creatinine, Ser: 1.04 mg/dL — ABNORMAL HIGH (ref 0.57–1.00)
GFR calc Af Amer: 58 mL/min/{1.73_m2} — ABNORMAL LOW (ref >59)
GFR calc non Af Amer: 50 mL/min/{1.73_m2} — ABNORMAL LOW (ref >59)
Globulin, Total: 2.4 g/dL (ref 1.5–4.5)
Glucose: 117 mg/dL — ABNORMAL HIGH (ref 65–99)
Potassium: 4.6 mmol/L (ref 3.5–5.2)
Sodium: 141 mmol/L (ref 134–144)
Total Protein: 6.8 g/dL (ref 6.0–8.5)

## 2019-09-25 LAB — CBC WITH DIFFERENTIAL/PLATELET
Basophils Absolute: 0 10*3/uL (ref 0.0–0.2)
Basos: 1 %
EOS (ABSOLUTE): 0.2 10*3/uL (ref 0.0–0.4)
Eos: 2 %
Hematocrit: 35.6 % (ref 34.0–46.6)
Hemoglobin: 12 g/dL (ref 11.1–15.9)
Immature Grans (Abs): 0 10*3/uL (ref 0.0–0.1)
Immature Granulocytes: 0 %
Lymphocytes Absolute: 2.5 10*3/uL (ref 0.7–3.1)
Lymphs: 30 %
MCH: 30.5 pg (ref 26.6–33.0)
MCHC: 33.7 g/dL (ref 31.5–35.7)
MCV: 91 fL (ref 79–97)
Monocytes Absolute: 0.8 10*3/uL (ref 0.1–0.9)
Monocytes: 10 %
Neutrophils Absolute: 4.9 10*3/uL (ref 1.4–7.0)
Neutrophils: 57 %
Platelets: 186 10*3/uL (ref 150–450)
RBC: 3.93 x10E6/uL (ref 3.77–5.28)
RDW: 13.4 % (ref 11.7–15.4)
WBC: 8.5 10*3/uL (ref 3.4–10.8)

## 2019-09-27 ENCOUNTER — Telehealth: Payer: Self-pay | Admitting: Family Medicine

## 2019-09-27 NOTE — Telephone Encounter (Signed)
Patients daughter Watt Climes called in regards to patients BP, states it has been low, her last readings have been -Pt has not check BP today 09/26/2019- 122/56 09/25/2019- 113/61 Patients daughter is concerned due to her taking 3 BP medications and none are helping. Please advise

## 2019-09-28 NOTE — Telephone Encounter (Signed)
Will route to PCP for review.   Patient has OV on 10/20/2019

## 2019-09-28 NOTE — Telephone Encounter (Signed)
Do you mind obtaining additional clarification? Thanks

## 2019-09-28 NOTE — Telephone Encounter (Signed)
Patient son called again requesting to speak with patient PCP regarding her BP. Patient son would also like to know patient  Sodium levels from the last blood work she had. Please f/u

## 2019-09-28 NOTE — Telephone Encounter (Signed)
She can hold off on Losartan and continue Carvedilol and Hydralazine until her visit with me.  Thank you.

## 2019-09-28 NOTE — Telephone Encounter (Signed)
Patient verified DOB Patient's daughter shared the patients BP has been fluctuating for weeks. Patient complains of fatigue. Patient has been taking Carvedilol and Hydralazine in the morning. Patient has not been taking losartan due to BP's being low normals. Patients BP on Saturday morning was 92/44. Daughter rechecked that afternoon and patient was 113-61. Patient has been running on average 120/60. Patients BP today was 141/73 without medication. Daughter would like to be advised on taking all 3 medications due to her BP not being consistent. Daughter is aware of sodium level being normal.

## 2019-09-29 NOTE — Telephone Encounter (Signed)
Patient son called back requesting to speak with Dr. Margarita Rana regarding patient BP. Patient son is upset because he requested to speak with Dr. Margarita Rana not the nurse. The patient is not feeling well and her son would like the advise form Dr. Margarita Rana. The son did not want to hang up the phone until he spoke with Dr. Margarita Rana. Told son that I would route a message to Dr. Margarita Rana but did not guarantee him she would call him back today because she is seeing patients. Please f/u

## 2019-09-29 NOTE — Telephone Encounter (Signed)
Patient's son was called and given information from PCP and he is requesting to speak with Dr>newlin, patient states that he does not want to speak to anyone but Dr. Margarita Rana.

## 2019-09-29 NOTE — Telephone Encounter (Signed)
I spoke to the patient's daughter Caroline Cooper over the phone and she expressed concerns over her mother's blood pressure readings which have been low lately with diastolic in the 44 range which later improved to 113.  She held losartan and has been administering carvedilol 25 mg and hydralazine 25 mg only in the morning but skipping the evening dose.  Blood pressures have been 130/67 and at night 132/62.  I have advised to continue hydralazine 25 mg in the morning and carvedilol half of 25 mg tablet in the morning and the other half in the evening.  We will review blood pressure and antihypertensive regimen at her upcoming visit with me.

## 2019-10-20 ENCOUNTER — Encounter: Payer: Self-pay | Admitting: Family Medicine

## 2019-10-20 ENCOUNTER — Other Ambulatory Visit: Payer: Self-pay

## 2019-10-20 ENCOUNTER — Ambulatory Visit: Payer: Medicare Other | Attending: Family Medicine | Admitting: Family Medicine

## 2019-10-20 DIAGNOSIS — I10 Essential (primary) hypertension: Secondary | ICD-10-CM

## 2019-10-20 DIAGNOSIS — E7800 Pure hypercholesterolemia, unspecified: Secondary | ICD-10-CM | POA: Diagnosis not present

## 2019-10-20 MED ORDER — ATORVASTATIN CALCIUM 40 MG PO TABS: 40.0000 mg | ORAL_TABLET | Freq: Every day | ORAL | 6 refills | Status: DC

## 2019-10-20 MED ORDER — CARVEDILOL 25 MG PO TABS: 25.0000 mg | ORAL_TABLET | Freq: Two times a day (BID) | ORAL | 6 refills | Status: DC

## 2019-10-20 MED ORDER — HYDRALAZINE HCL 25 MG PO TABS: 25.0000 mg | ORAL_TABLET | Freq: Two times a day (BID) | ORAL | 6 refills | Status: DC

## 2019-10-20 NOTE — Progress Notes (Signed)
Patient has been called and DOB has been verified. Patient has been screened and transferred to PCP to start phone visit.     

## 2019-10-20 NOTE — Progress Notes (Signed)
Virtual Visit via Telephone Note  I connected with Caroline Cooper, on 10/20/2019 at 3:31 PM by telephone due to the COVID-19 pandemic and verified that I am speaking with the correct person using two identifiers.   Consent: I discussed the limitations, risks, security and privacy concerns of performing an evaluation and management service by telephone and the availability of in person appointments. I also discussed with the patient that there may be a patient responsible charge related to this service. The patient expressed understanding and agreed to proceed.   Location of Patient: Home  Location of Provider: Clinic   Persons participating in Telemedicine visit: Dalena Graci Cooper Charlette Caffey Farrington-CMA Dr. Margarita Rana     History of Present Illness: Caroline Cooper Caroline Cooper   is an 84 year old female with a history of hypertension, type 2 diabetes mellitus (A1c 5.8), depression,early dementia, overactive bladder who is seen today for a follow-up visit  Patient states she feels fine and is wondering how long she will need to be on her chronic medications Her daughter had called last month with concerns over her fluctuating blood pressures which were generally low and was advised to decrease Coreg to 12.5mg  bid. She takes Coreg 12.5mg  bid and Hydralazine 25mg  daily (rather than bid) Her blood pressure was 155/71, HR 55; 137/54 on a prior date. Blood sugars have in the 118 range and she is currently not on any diabetic medications and denies hypoglycemia. She is otherwise doing well and her major caregiver is her husband. She refuses to take Aricept which was prescribed for her dementia.    Past Medical History:  Diagnosis Date  . Diabetes mellitus without complication ([place])   . Diverticulitis   . DM (diabetes mellitus) ([place]) 09/24/2015  . HTN (hypertension) 09/24/2015  . Hyperlipidemia 09/24/2015  . Hypertension    Allergies  Allergen Reactions  .  Penicillins Rash     Has patient had a PCN reaction causing immediate rash, facial/tongue/throat swelling, SOB or lightheadedness with hypotension: No Has patient had a PCN reaction causing severe rash involving mucus membranes or skin necrosis: No Has patient had a PCN reaction that required hospitalization No Has patient had a PCN reaction occurring within the last 10 years: No If all of the above answers are "NO", then may proceed with Cephalosporin use.     Current Outpatient Medications on File Prior to Visit  Medication Sig Dispense Refill  . atorvastatin (LIPITOR) 40 MG tablet Take 1 tablet (40 mg total) by mouth daily. 30 tablet 3  . carvedilol (COREG) 25 MG tablet Take 1 tablet (25 mg total) by mouth 2 (two) times daily with a meal. 60 tablet 3  . hydrALAZINE (APRESOLINE) 25 MG tablet Take 1 tablet (25 mg total) by mouth 2 (two) times daily. 60 tablet 3  . acetaminophen (TYLENOL) 500 MG tablet Take 1,000 mg by mouth every 8 (eight) hours as needed for moderate pain or headache.    . donepezil (ARICEPT) 10 MG tablet TAKE 1 TABLET BY MOUTH EVERY NIGHT AT BEDTIME (Patient not taking: Reported on 09/16/2019) 90 tablet 0  . losartan (COZAAR) 100 MG tablet Take 1 tablet (100 mg total) by mouth daily. (Patient not taking: Reported on 10/20/2019) 30 tablet 3  . olopatadine (PATANOL) 0.1 % ophthalmic solution Place 1 drop into both eyes 2 (two) times daily. (Patient not taking: Reported on 09/03/2018) 5 mL 2  . sulfamethoxazole-trimethoprim (BACTRIM DS) 800-160 MG tablet Take 1 tablet by mouth 2 (two) times daily. (Patient  not taking: Reported on 10/20/2019) 10 tablet 0   No current facility-administered medications on file prior to visit.     Observations/Objective: Awake, alert, oriented x3 Not in acute distress  Lab Results  Component Value Date   HGBA1C 5.8 09/03/2018    Lipid Panel     Component Value Date/Time   CHOL 140 09/03/2018 1127   TRIG 122 09/03/2018 1127   HDL 56  09/03/2018 1127   CHOLHDL 2.5 09/03/2018 1127   CHOLHDL 3.0 10/03/2016 0859   VLDL 26 10/03/2016 0859   LDLCALC 60 09/03/2018 1127   LABVLDL 24 09/03/2018 1127    CMP Latest Ref Rng & Units 09/22/2019 09/19/2019 09/18/2019  Glucose 65 - 99 mg/dL 117(H) 99 143(H)  BUN 8 - 27 mg/dL 12 12 13   Creatinine 0.57 - 1.00 mg/dL 1.04(H) 0.82 0.77  Sodium 134 - 144 mmol/L 141 138 138  Potassium 3.5 - 5.2 mmol/L 4.6 4.2 4.2  Chloride 96 - 106 mmol/L 98 106 106  CO2 20 - 29 mmol/L 26 25 25   Calcium 8.7 - 10.3 mg/dL 9.5 8.4(L) 8.6(L)  Total Protein 6.0 - 8.5 g/dL 6.8 - -  Total Bilirubin 0.0 - 1.2 mg/dL 0.7 - -  Alkaline Phos 39 - 117 IU/L 92 - -  AST 0 - 40 IU/L 22 - -  ALT 0 - 32 IU/L 15 - -     Assessment and Plan: 1. Pure hypercholesterolemia Controlled Low-cholesterol diet - atorvastatin (LIPITOR) 40 MG tablet; Take 1 tablet (40 mg total) by mouth daily.  Dispense: 30 tablet; Refill: 6  2. Essential hypertension Uncontrolled Advised to take hydralazine twice daily as elevated blood pressure today could be as a result of skipping evening dose of hydralazine Counseled on blood pressure goal of less than 130/80, low-sodium, DASH diet, medication compliance, 150 minutes of moderate intensity exercise per week. Discussed medication compliance, adverse effects. - carvedilol (COREG) 25 MG tablet; Take 1 tablet (25 mg total) by mouth 2 (two) times daily with a meal.  Dispense: 60 tablet; Refill: 6 - hydrALAZINE (APRESOLINE) 25 MG tablet; Take 1 tablet (25 mg total) by mouth 2 (two) times daily.  Dispense: 60 tablet; Refill: 6   Follow Up Instructions: Return in about 3 months (around 01/20/2020) for chronic medical conditions.    I discussed the assessment and treatment plan with the patient. The patient was provided an opportunity to ask questions and all were answered. The patient agreed with the plan and demonstrated an understanding of the instructions.   The patient was advised to  call back or seek an in-person evaluation if the symptoms worsen or if the condition fails to improve as anticipated.     I provided 15 minutes total of non-face-to-face time during this encounter including median intraservice time, reviewing previous notes, labs, imaging, medications, management and patient verbalized understanding.     Charlott Rakes, MD, FAAFP. [facility], [place]   10/20/2019, 3:31 PM

## 2019-12-16 ENCOUNTER — Other Ambulatory Visit: Payer: Self-pay | Admitting: Physician Assistant

## 2019-12-16 ENCOUNTER — Other Ambulatory Visit: Payer: Self-pay | Admitting: Family Medicine

## 2019-12-16 DIAGNOSIS — I10 Essential (primary) hypertension: Secondary | ICD-10-CM

## 2019-12-16 DIAGNOSIS — E7800 Pure hypercholesterolemia, unspecified: Secondary | ICD-10-CM

## 2020-01-27 ENCOUNTER — Ambulatory Visit: Payer: Medicare Other

## 2020-02-28 ENCOUNTER — Ambulatory Visit: Payer: Medicare Other | Attending: Internal Medicine

## 2020-02-28 DIAGNOSIS — Z23 Encounter for immunization: Secondary | ICD-10-CM

## 2020-02-28 NOTE — Progress Notes (Signed)
   Covid-19 Vaccination Clinic  Name:  Aliz Musgrave Colon [name]    MRN: [id number] DOB: 08-06-1935  02/28/2020  Ms. Colon [name] was observed post Covid-19 immunization for 15 minutes without incident. She was provided with Vaccine Information Sheet and instruction to access the V-Safe system.   Ms. Colon [name] was instructed to call 911 with any severe reactions post vaccine: Marland Kitchen Difficulty breathing  . Swelling of face and throat  . A fast heartbeat  . A bad rash all over body  . Dizziness and weakness   Immunizations Administered    Name Date Dose VIS Date Route   Pfizer COVID-19 Vaccine 02/28/2020 10:29 AM 0.3 mL 11/12/2019 Intramuscular   Manufacturer: [place]   Lot: [id number]   [place]: [id number]

## 2020-03-22 ENCOUNTER — Ambulatory Visit: Payer: Medicare Other | Attending: Internal Medicine

## 2020-03-22 DIAGNOSIS — Z23 Encounter for immunization: Secondary | ICD-10-CM

## 2020-03-22 NOTE — Progress Notes (Signed)
   Covid-19 Vaccination Clinic  Name:  Caroline Cooper    MRN: [id number] DOB: [birth year]  03/22/2020  Caroline Cooper was observed post Covid-19 immunization for 15 minutes without incident. She was provided with Vaccine Information Sheet and instruction to access the V-Safe system.   Caroline Cooper was instructed to call 911 with any severe reactions post vaccine: Marland Kitchen Difficulty breathing  . Swelling of face and throat  . A fast heartbeat  . A bad rash all over body  . Dizziness and weakness   Immunizations Administered    Name Date Dose VIS Date Route   Pfizer COVID-19 Vaccine 03/22/2020 10:19 AM 0.3 mL 01/26/2019 Intramuscular   Manufacturer: [place]   Lot: [id number]   [place]: [id number]

## 2020-04-10 ENCOUNTER — Ambulatory Visit: Payer: Medicare Other | Attending: Family Medicine | Admitting: Family Medicine

## 2020-04-10 ENCOUNTER — Encounter: Payer: Self-pay | Admitting: Family Medicine

## 2020-04-10 ENCOUNTER — Other Ambulatory Visit: Payer: Self-pay

## 2020-04-10 VITALS — BP 145/77 | HR 65 | Ht 64.0 in | Wt 169.2 lb

## 2020-04-10 DIAGNOSIS — R413 Other amnesia: Secondary | ICD-10-CM | POA: Diagnosis not present

## 2020-04-10 DIAGNOSIS — N3281 Overactive bladder: Secondary | ICD-10-CM

## 2020-04-10 DIAGNOSIS — I10 Essential (primary) hypertension: Secondary | ICD-10-CM

## 2020-04-10 DIAGNOSIS — E1169 Type 2 diabetes mellitus with other specified complication: Secondary | ICD-10-CM

## 2020-04-10 DIAGNOSIS — E785 Hyperlipidemia, unspecified: Secondary | ICD-10-CM

## 2020-04-10 LAB — POCT GLYCOSYLATED HEMOGLOBIN (HGB A1C): HbA1c, POC (controlled diabetic range): 6.1 % (ref 0.0–7.0)

## 2020-04-10 LAB — POCT URINALYSIS DIP (CLINITEK)
Bilirubin, UA: NEGATIVE
Blood, UA: NEGATIVE
Glucose, UA: NEGATIVE mg/dL
Ketones, POC UA: NEGATIVE mg/dL
Leukocytes, UA: NEGATIVE
Nitrite, UA: NEGATIVE
POC PROTEIN,UA: NEGATIVE
Spec Grav, UA: 1.02 (ref 1.010–1.025)
Urobilinogen, UA: 0.2 E.U./dL
pH, UA: 6 (ref 5.0–8.0)

## 2020-04-10 MED ORDER — ATORVASTATIN CALCIUM 40 MG PO TABS: ORAL_TABLET | ORAL | 1 refills | Status: DC

## 2020-04-10 MED ORDER — HYDRALAZINE HCL 25 MG PO TABS: ORAL_TABLET | ORAL | 1 refills | Status: DC

## 2020-04-10 MED ORDER — CARVEDILOL 25 MG PO TABS: ORAL_TABLET | ORAL | 1 refills | Status: DC

## 2020-04-10 MED ORDER — OXYBUTYNIN CHLORIDE 5 MG PO TABS: 5.0000 mg | ORAL_TABLET | Freq: Two times a day (BID) | ORAL | 6 refills | Status: DC

## 2020-04-10 NOTE — Progress Notes (Signed)
Subjective:  Patient ID: [place], female    DOB: 16-May-1935  Age: 85 y.o. MRN: [id number]  CC: Hypertension   HPI Tillson Colon Santiagois an 85 year old female with a history of hypertension, type 2 diabetes mellitus (A1c5.8), depression,early dementia,overactive bladder who is seen today for a follow-up visit  She is accompanied by her daughter who complains the patient wakes up 10-12 times at night to urinate; she has the urge and sometimes goes but at other times nothing comes out. Was previously on Oxybutynin which she discontinued Denies dysuria, hematuria  Her sons would like a Neurology referral due to memory problems.  She was previously on Aricept but insisted on getting off it. Hallucinates at night as well. Her diabetes is diet-controlled and she is doing well on her antihypertensive  Past Medical History:  Diagnosis Date  . Diabetes mellitus without complication ([place])   . Diverticulitis   . DM (diabetes mellitus) ([place]) 09/24/2015  . HTN (hypertension) 09/24/2015  . Hyperlipidemia 09/24/2015  . Hypertension     Past Surgical History:  Procedure Laterality Date  . ABDOMINAL HYSTERECTOMY    . CHOLECYSTECTOMY    . COLON SURGERY     COLECTOMY FOR DIVERTICULITIS  . HERNIA REPAIR      Family History  Problem Relation Age of Onset  . Hypertension Other     Allergies  Allergen Reactions  . Penicillins Rash     Has patient had a PCN reaction causing immediate rash, facial/tongue/throat swelling, SOB or lightheadedness with hypotension: No Has patient had a PCN reaction causing severe rash involving mucus membranes or skin necrosis: No Has patient had a PCN reaction that required hospitalization No Has patient had a PCN reaction occurring within the last 10 years: No If all of the above answers are "NO", then may proceed with Cephalosporin use.     Outpatient Medications Prior to Visit  Medication Sig Dispense  Refill  . atorvastatin (LIPITOR) 40 MG tablet TAKE 1 TABLET(40 MG) BY MOUTH DAILY 30 tablet 2  . carvedilol (COREG) 25 MG tablet TAKE 1 TABLET(25 MG) BY MOUTH TWICE DAILY WITH A MEAL 60 tablet 2  . acetaminophen (TYLENOL) 500 MG tablet Take 1,000 mg by mouth every 8 (eight) hours as needed for moderate pain or headache.    . hydrALAZINE (APRESOLINE) 25 MG tablet TAKE 1 TABLET(25 MG) BY MOUTH TWICE DAILY (Patient not taking: Reported on 04/10/2020) 60 tablet 2  . olopatadine (PATANOL) 0.1 % ophthalmic solution Place 1 drop into both eyes 2 (two) times daily. (Patient not taking: Reported on 09/03/2018) 5 mL 2  . sulfamethoxazole-trimethoprim (BACTRIM DS) 800-160 MG tablet Take 1 tablet by mouth 2 (two) times daily. (Patient not taking: Reported on 10/20/2019) 10 tablet 0   No facility-administered medications prior to visit.     ROS Review of Systems  Constitutional: Negative for activity change, appetite change and fatigue.  HENT: Negative for congestion, sinus pressure and sore throat.   Eyes: Negative for visual disturbance.  Respiratory: Negative for cough, chest tightness, shortness of breath and wheezing.   Cardiovascular: Negative for chest pain and palpitations.  Gastrointestinal: Negative for abdominal distention, abdominal pain and constipation.  Endocrine: Negative for polydipsia.  Genitourinary: Negative for dysuria and frequency.  Musculoskeletal: Negative for arthralgias and back pain.  Skin: Negative for rash.  Neurological: Negative for tremors, light-headedness and numbness.  Hematological: Does not bruise/bleed easily.  Psychiatric/Behavioral: Negative for agitation and behavioral problems.  Objective:  BP (!) 145/77   Pulse 65   Ht 5\' 4"  (1.626 m)   Wt 169 lb 3.2 oz (76.7 kg)   SpO2 98%   BMI 29.04 kg/m   BP/Weight 04/10/2020 09/19/2019 123XX123  Systolic BP [id number]  Diastolic BP 77 59 76  Wt. (Lbs) 169.2 166.45 163.2  BMI 29.04 28.57 28.01       Physical Exam Constitutional:      Appearance: She is well-developed.  Neck:     Vascular: No JVD.  Cardiovascular:     Rate and Rhythm: Normal rate.     Heart sounds: Normal heart sounds. No murmur.  Pulmonary:     Effort: Pulmonary effort is normal.     Breath sounds: Normal breath sounds. No wheezing or rales.  Chest:     Chest wall: No tenderness.  Abdominal:     General: Bowel sounds are normal. There is no distension.     Palpations: Abdomen is soft. There is no mass.     Tenderness: There is no abdominal tenderness.  Musculoskeletal:        General: Normal range of motion.     Right lower leg: No edema.     Left lower leg: No edema.  Neurological:     Mental Status: She is alert and oriented to person, place, and time.  Psychiatric:        Mood and Affect: Mood normal.     CMP Latest Ref Rng & Units 09/22/2019 09/19/2019 09/18/2019  Glucose 65 - 99 mg/dL 117(H) 99 143(H)  BUN 8 - 27 mg/dL 12 12 13   Creatinine 0.57 - 1.00 mg/dL 1.04(H) 0.82 0.77  Sodium 134 - 144 mmol/L 141 138 138  Potassium 3.5 - 5.2 mmol/L 4.6 4.2 4.2  Chloride 96 - 106 mmol/L 98 106 106  CO2 20 - 29 mmol/L 26 25 25   Calcium 8.7 - 10.3 mg/dL 9.5 8.4(L) 8.6(L)  Total Protein 6.0 - 8.5 g/dL 6.8 - -  Total Bilirubin 0.0 - 1.2 mg/dL 0.7 - -  Alkaline Phos 39 - 117 IU/L 92 - -  AST 0 - 40 IU/L 22 - -  ALT 0 - 32 IU/L 15 - -    Lipid Panel     Component Value Date/Time   CHOL 140 09/03/2018 1127   TRIG 122 09/03/2018 1127   HDL 56 09/03/2018 1127   CHOLHDL 2.5 09/03/2018 1127   CHOLHDL 3.0 10/03/2016 0859   VLDL 26 10/03/2016 0859   LDLCALC 60 09/03/2018 1127    CBC    Component Value Date/Time   WBC 8.5 09/22/2019 0949   WBC 8.9 09/17/2019 0154   RBC 3.93 09/22/2019 0949   RBC 3.96 09/17/2019 0154   HGB 12.0 09/22/2019 0949   HCT 35.6 09/22/2019 0949   PLT 186 09/22/2019 0949   MCV 91 09/22/2019 0949   MCH 30.5 09/22/2019 0949   MCH 31.1 09/17/2019 0154   MCHC 33.7  09/22/2019 0949   MCHC 35.4 09/17/2019 0154   RDW 13.4 09/22/2019 0949   LYMPHSABS 2.5 09/22/2019 0949   MONOABS 0.6 09/16/2019 1913   EOSABS 0.2 09/22/2019 0949   BASOSABS 0.0 09/22/2019 0949    Lab Results  Component Value Date   HGBA1C 5.8 09/03/2018    Mini-Cog - 04/11/20 1332    Normal clock drawing test?  no    How many words correct?  0       Assessment & Plan:   1. Type 2  diabetes mellitus with other specified complication, without long-term current use of insulin (HCC) Diet controlled with A1c of 5.8 Counseled on Diabetic diet, my plate method, [id number] minutes of moderate intensity exercise/week Blood sugar logs with fasting goals of 80-120 mg/dl, random of less than 180 and in the event of sugars less than 60 mg/dl or greater than 400 mg/dl encouraged to notify the clinic. Advised on the need for annual eye exams, annual foot exams, Pneumonia vaccine. - POCT glycosylated hemoglobin (Hb 123XX123) - Basic Metabolic Panel  2. Memory loss Previously on Aricept which she discontinued Family would like neurology referral - Ambulatory referral to Neurology  3. Overactive bladder Previously on oxybutynin which she discontinued I have restarted oxybutynin again - POCT URINALYSIS DIP (CLINITEK) - oxybutynin (DITROPAN) 5 MG tablet; Take 1 tablet (5 mg total) by mouth 2 (two) times daily.  Dispense: 60 tablet; Refill: 6  4. Hyperlipidemia associated with type 2 diabetes mellitus (HCC) Controlled Low-cholesterol diet - atorvastatin (LIPITOR) 40 MG tablet; TAKE 1 TABLET(40 MG) BY MOUTH DAILY  Dispense: 90 tablet; Refill: 1  5. Essential hypertension Blood pressure is above goal Daughter states that that has been breaking one of her pills in half; advised to administer medication as prescribed Counseled on blood pressure goal of less than 130/80, low-sodium, DASH diet, medication compliance, 150 minutes of moderate intensity exercise per week. Discussed medication compliance,  adverse effects. - carvedilol (COREG) 25 MG tablet; TAKE 1 TABLET(25 MG) BY MOUTH TWICE DAILY WITH A MEAL  Dispense: 180 tablet; Refill: 1 - hydrALAZINE (APRESOLINE) 25 MG tablet; TAKE 1 TABLET(25 MG) BY MOUTH TWICE DAILY  Dispense: 180 tablet; Refill: 1  Healthcare maintenance -declines DEXA scan, declines Prevnar 13, declines Tdap Return in about 6 months (around 10/11/2020) for chronic disease management.   Charlott Rakes, MD, FAAFP. [facility] and [facility] [place], [place]   04/10/2020, 9:39 AM

## 2020-04-10 NOTE — Patient Instructions (Signed)

## 2020-04-10 NOTE — Progress Notes (Signed)
Frequent urination at night Referral to a Neurologist.

## 2020-04-11 LAB — BASIC METABOLIC PANEL
BUN/Creatinine Ratio: 18 (ref 12–28)
BUN: 16 mg/dL (ref 8–27)
CO2: 25 mmol/L (ref 20–29)
Calcium: 9.3 mg/dL (ref 8.7–10.3)
Chloride: 102 mmol/L (ref 96–106)
Creatinine, Ser: 0.9 mg/dL (ref 0.57–1.00)
GFR calc Af Amer: 69 mL/min/{1.73_m2} (ref >59)
GFR calc non Af Amer: 60 mL/min/{1.73_m2} (ref >59)
Glucose: 105 mg/dL — ABNORMAL HIGH (ref 65–99)
Potassium: 4.3 mmol/L (ref 3.5–5.2)
Sodium: 141 mmol/L (ref 134–144)

## 2020-04-13 ENCOUNTER — Encounter: Payer: Self-pay | Admitting: Family Medicine

## 2020-05-24 ENCOUNTER — Encounter: Payer: Self-pay | Admitting: Diagnostic Neuroimaging

## 2020-05-24 ENCOUNTER — Ambulatory Visit (INDEPENDENT_AMBULATORY_CARE_PROVIDER_SITE_OTHER): Payer: Medicare Other | Admitting: Diagnostic Neuroimaging

## 2020-05-24 ENCOUNTER — Other Ambulatory Visit: Payer: Self-pay

## 2020-05-24 VITALS — BP 161/77 | HR 66 | Ht 64.0 in | Wt 167.0 lb

## 2020-05-24 DIAGNOSIS — F0391 Unspecified dementia with behavioral disturbance: Secondary | ICD-10-CM | POA: Diagnosis not present

## 2020-05-24 DIAGNOSIS — F03B18 Unspecified dementia, moderate, with other behavioral disturbance: Secondary | ICD-10-CM

## 2020-05-24 NOTE — Progress Notes (Signed)
[facility]  PATIENT: Caroline Cooper Caroline Cooper DOB: 12/14/1934  REFERRING CLINICIAN: Charlott Rakes, MD HISTORY FROM: patient, daughter, son REASON FOR VISIT: new consult    HISTORICAL  CHIEF COMPLAINT:  Chief Complaint  Patient presents with  . Memory Loss    rm 6 New Pt, son- [name], dgtr- [name]  MMSE 14    HISTORY OF PRESENT ILLNESS:   85 year old female here for evaluation of dementia.  Patient has had gradual onset progressive short-term memory loss, confusion, decline in ADLs since 2016.  Patient lives at home with husband.  He is the primary caregiver.  She tends to repeat herself.  She is becoming more stubborn in terms of following instructions and taking medications.  Patient also having increasing frequency of urination, has tried bladder medication without relief.  Patient tends to wake up several times at night to urinate.  She has seen urology.  Patient also having some confusion and hallucinations.  Also having some mood changes.  Patient was on donepezil in the past but stopped this.  Family is looking into getting additional caregiver support for patient's husband.   REVIEW OF SYSTEMS: Full 14 system review of systems performed and negative with exception of: As per HPI.  ALLERGIES: Allergies  Allergen Reactions  . Penicillins Rash     Has patient had a PCN reaction causing immediate rash, facial/tongue/throat swelling, SOB or lightheadedness with hypotension: No Has patient had a PCN reaction causing severe rash involving mucus membranes or skin necrosis: No Has patient had a PCN reaction that required hospitalization No Has patient had a PCN reaction occurring within the last 10 years: No If all of the above answers are "NO", then may proceed with Cephalosporin use.     HOME MEDICATIONS: Outpatient Medications Prior to Visit  Medication Sig Dispense Refill  . acetaminophen (TYLENOL) 500 MG tablet Take 1,000 mg by mouth every 8  (eight) hours as needed for moderate pain or headache.    Marland Kitchen atorvastatin (LIPITOR) 40 MG tablet TAKE 1 TABLET(40 MG) BY MOUTH DAILY 90 tablet 1  . carvedilol (COREG) 25 MG tablet TAKE 1 TABLET(25 MG) BY MOUTH TWICE DAILY WITH A MEAL 180 tablet 1  . hydrALAZINE (APRESOLINE) 25 MG tablet TAKE 1 TABLET(25 MG) BY MOUTH TWICE DAILY 180 tablet 1  . olopatadine (PATANOL) 0.1 % ophthalmic solution Place 1 drop into both eyes 2 (two) times daily. (Patient not taking: Reported on 09/03/2018) 5 mL 2  . oxybutynin (DITROPAN) 5 MG tablet Take 1 tablet (5 mg total) by mouth 2 (two) times daily. (Patient not taking: Reported on 05/24/2020) 60 tablet 6  . sulfamethoxazole-trimethoprim (BACTRIM DS) 800-160 MG tablet Take 1 tablet by mouth 2 (two) times daily. (Patient not taking: Reported on 10/20/2019) 10 tablet 0   No facility-administered medications prior to visit.    PAST MEDICAL HISTORY: Past Medical History:  Diagnosis Date  . Diabetes mellitus without complication ([place])   . Diverticulitis   . DM (diabetes mellitus) ([place]) 09/24/2015  . HTN (hypertension) 09/24/2015  . Hyperlipidemia 09/24/2015  . Hypertension     PAST SURGICAL HISTORY: Past Surgical History:  Procedure Laterality Date  . ABDOMINAL HYSTERECTOMY    . CHOLECYSTECTOMY    . Cooper SURGERY     COLECTOMY FOR DIVERTICULITIS  . HERNIA REPAIR      FAMILY HISTORY: Family History  Problem Relation Age of Onset  . Hypertension Other     SOCIAL HISTORY: Social History   Socioeconomic History  .  Marital status: Married    Spouse name: Not on file  . Number of children: 4  . Years of education: Not on file  . Highest education level: High school graduate  Occupational History    Comment: CNA retired  Tobacco Use  . Smoking status: Never Smoker  . Smokeless tobacco: Never Used  Vaping Use  . Vaping Use: Never used  Substance and Sexual Activity  . Alcohol use: No  . Drug use: No  . Sexual activity: Not on file  Other  Topics Concern  . Not on file  Social History Narrative   05/24/20 lives with husband   Social Determinants of Health   Financial Resource Strain:   . Difficulty of Paying Living Expenses:   Food Insecurity:   . Worried About Charity fundraiser in the Last Year:   . Arboriculturist in the Last Year:   Transportation Needs:   . Film/video editor (Medical):   Marland Kitchen Lack of Transportation (Non-Medical):   Physical Activity:   . Days of Exercise per Week:   . Minutes of Exercise per Session:   Stress:   . Feeling of Stress :   Social Connections:   . Frequency of Communication with Friends and Family:   . Frequency of Social Gatherings with Friends and Family:   . Attends Religious Services:   . Active Member of Clubs or Organizations:   . Attends Archivist Meetings:   Marland Kitchen Marital Status:   Intimate Partner Violence:   . Fear of Current or Ex-Partner:   . Emotionally Abused:   Marland Kitchen Physically Abused:   . Sexually Abused:      PHYSICAL EXAM  GENERAL EXAM/CONSTITUTIONAL: Vitals:  Vitals:   05/24/20 0851  BP: (!) 161/77  Pulse: 66  Weight: 167 lb (75.8 kg)  Height: 5\' 4"  (1.626 m)     Body mass index is 28.67 kg/m. Wt Readings from Last 3 Encounters:  05/24/20 167 lb (75.8 kg)  04/10/20 169 lb 3.2 oz (76.7 kg)  09/19/19 166 lb 7.2 oz (75.5 kg)     Patient is in no distress; well developed, nourished and groomed; neck is supple  CARDIOVASCULAR:  Examination of carotid arteries is normal; no carotid bruits  Regular rate and rhythm, no murmurs  Examination of peripheral vascular system by observation and palpation is normal  EYES:  Ophthalmoscopic exam of optic discs and posterior segments is normal; no papilledema or hemorrhages  No exam data present  MUSCULOSKELETAL:  Gait, strength, tone, movements noted in Neurologic exam below  NEUROLOGIC: MENTAL STATUS:  MMSE - Mini Mental State Exam 05/24/2020  Orientation to time 1  Orientation to  Place 1  Registration 3  Attention/ Calculation 1  Recall 0  Language- name 2 objects 2  Language- repeat 0  Language- follow 3 step command 3  Language- read & follow direction 1  Write a sentence 1  Copy design 1  Total score 14    awake, alert, oriented to person  [facility] memory   DECR attention and concentration  language fluent, comprehension intact, naming intact  fund of knowledge appropriate  CRANIAL NERVE:   2nd - no papilledema on fundoscopic exam  2nd, 3rd, 4th, 6th - pupils equal and reactive to light, visual fields full to confrontation, extraocular muscles intact, no nystagmus  5th - facial sensation symmetric  7th - facial strength symmetric  8th - hearing intact  9th - palate elevates symmetrically, uvula midline  11th -  shoulder shrug symmetric  12th - tongue protrusion midline  MOTOR:   normal bulk and tone, full strength in the BUE, BLE  SENSORY:   normal and symmetric to light touch  COORDINATION:   finger-nose-finger, fine finger movements normal  REFLEXES:   deep tendon reflexes present and symmetric  GAIT/STATION:   narrow based gait     DIAGNOSTIC DATA (LABS, IMAGING, TESTING) - I reviewed patient records, labs, notes, testing and imaging myself where available.  Lab Results  Component Value Date   WBC 8.5 09/22/2019   HGB 12.0 09/22/2019   HCT 35.6 09/22/2019   MCV 91 09/22/2019   PLT 186 09/22/2019      Component Value Date/Time   NA 141 04/10/2020 1033   K 4.3 04/10/2020 1033   CL 102 04/10/2020 1033   CO2 25 04/10/2020 1033   GLUCOSE 105 (H) 04/10/2020 1033   GLUCOSE 99 09/19/2019 0236   BUN 16 04/10/2020 1033   CREATININE 0.90 04/10/2020 1033   CREATININE 0.93 10/03/2016 0859   CALCIUM 9.3 04/10/2020 1033   PROT 6.8 09/22/2019 0949   ALBUMIN 4.4 09/22/2019 0949   AST 22 09/22/2019 0949   ALT 15 09/22/2019 0949   ALKPHOS 92 09/22/2019 0949   BILITOT 0.7 09/22/2019 0949   GFRNONAA 60 04/10/2020 1033    GFRNONAA 59 (L) 10/03/2016 0859   GFRAA 69 04/10/2020 1033   GFRAA 68 10/03/2016 0859   Lab Results  Component Value Date   CHOL 140 09/03/2018   HDL 56 09/03/2018   LDLCALC 60 09/03/2018   TRIG 122 09/03/2018   CHOLHDL 2.5 09/03/2018   Lab Results  Component Value Date   HGBA1C 6.1 04/10/2020   Lab Results  Component Value Date   VITAMINB12 890 03/16/2017   Lab Results  Component Value Date   TSH 1.643 09/16/2019    11/14/17 CT HEAD [I reviewed images myself and agree with interpretation. -VRP]  1. No evidence of acute intracranial abnormality. 2. Mild-to-moderate chronic small vessel ischemic disease.    ASSESSMENT AND PLAN  85 y.o. year old female here with gradual onset (since 2016) progressive short-term memory loss, confusion, decline in ADLs, hallucinations, mood changes, consistent with moderate neurodegenerative dementia.  Dx:  1. Moderate dementia with behavioral disturbance (HCC)      PLAN:  MODERATE DEMENTIA WITH BEHAVIORAL CHANGES - consider memantine 10mg  at bedtime; increase to twice a day after 1-2 weeks - consider donepezil 10mg  at bedtime - consider SSRI for mood changes - consider trazodone for insomnia - follow up with PCP / urology re: frequent urination - safety / supervision issues reviewed - caregiver resources provided - no driving  Return for return to PCP, pending if symptoms worsen or fail to improve.    Penni Bombard, MD 4/56/2563, 8:93 AM Certified in Neurology, Neurophysiology and Neuroimaging  [facility] [address] [place], [place] [address] [phone]

## 2020-05-24 NOTE — Patient Instructions (Signed)
MODERATE DEMENTIA WITH BEHAVIORAL CHANGES - consider memantine 10mg  at bedtime; increase to twice a day after 1-2 weeks - consider donepezil 10mg  at bedtime - safety / supervision issues reviewed - caregiver resources provided - no driving; caution with finances

## 2020-09-07 ENCOUNTER — Telehealth: Payer: Self-pay

## 2020-09-07 NOTE — Telephone Encounter (Signed)
Please accommodate a sooner appt if possible  Copied from [place] [id number]. Topic: General - Inquiry >> Sep 07, 2020 10:40 AM Gillis Ends D wrote: Reason for CRM: Patients son called and wanted a office visit for his mom to receive a dementia diagnosis. The first available appointment was in November. He stated that he needs it much sooner. He would like to be seen as soon as possible in reference to this issue. He can be reached at [phone], and his name is [name]. Please advise

## 2020-09-08 NOTE — Telephone Encounter (Signed)
Returned call to patient son and scheduled an appt for 09/12/20.

## 2020-09-12 ENCOUNTER — Encounter: Payer: Self-pay | Admitting: Family Medicine

## 2020-09-12 ENCOUNTER — Ambulatory Visit: Payer: Medicare Other | Attending: Family Medicine | Admitting: Family Medicine

## 2020-09-12 ENCOUNTER — Other Ambulatory Visit: Payer: Self-pay

## 2020-09-12 VITALS — BP 164/98 | HR 64 | Ht 64.0 in | Wt 168.2 lb

## 2020-09-12 DIAGNOSIS — N3281 Overactive bladder: Secondary | ICD-10-CM | POA: Insufficient documentation

## 2020-09-12 DIAGNOSIS — Z8249 Family history of ischemic heart disease and other diseases of the circulatory system: Secondary | ICD-10-CM | POA: Diagnosis not present

## 2020-09-12 DIAGNOSIS — G4709 Other insomnia: Secondary | ICD-10-CM | POA: Diagnosis not present

## 2020-09-12 DIAGNOSIS — Z9049 Acquired absence of other specified parts of digestive tract: Secondary | ICD-10-CM | POA: Insufficient documentation

## 2020-09-12 DIAGNOSIS — I10 Essential (primary) hypertension: Secondary | ICD-10-CM | POA: Diagnosis not present

## 2020-09-12 DIAGNOSIS — E1169 Type 2 diabetes mellitus with other specified complication: Secondary | ICD-10-CM | POA: Diagnosis not present

## 2020-09-12 DIAGNOSIS — F0391 Unspecified dementia with behavioral disturbance: Secondary | ICD-10-CM | POA: Insufficient documentation

## 2020-09-12 DIAGNOSIS — Z79899 Other long term (current) drug therapy: Secondary | ICD-10-CM | POA: Insufficient documentation

## 2020-09-12 DIAGNOSIS — Z90710 Acquired absence of both cervix and uterus: Secondary | ICD-10-CM | POA: Insufficient documentation

## 2020-09-12 DIAGNOSIS — F329 Major depressive disorder, single episode, unspecified: Secondary | ICD-10-CM | POA: Diagnosis not present

## 2020-09-12 DIAGNOSIS — E785 Hyperlipidemia, unspecified: Secondary | ICD-10-CM | POA: Insufficient documentation

## 2020-09-12 DIAGNOSIS — N3941 Urge incontinence: Secondary | ICD-10-CM | POA: Insufficient documentation

## 2020-09-12 DIAGNOSIS — Z880 Allergy status to penicillin: Secondary | ICD-10-CM | POA: Insufficient documentation

## 2020-09-12 LAB — POCT URINALYSIS DIP (CLINITEK)
Bilirubin, UA: NEGATIVE
Glucose, UA: NEGATIVE mg/dL
Ketones, POC UA: NEGATIVE mg/dL
Leukocytes, UA: NEGATIVE
Nitrite, UA: NEGATIVE
POC PROTEIN,UA: NEGATIVE
Spec Grav, UA: 1.01 (ref 1.010–1.025)
Urobilinogen, UA: 0.2 E.U./dL
pH, UA: 5.5 (ref 5.0–8.0)

## 2020-09-12 LAB — POCT GLYCOSYLATED HEMOGLOBIN (HGB A1C): HbA1c, POC (controlled diabetic range): 6.4 % (ref 0.0–7.0)

## 2020-09-12 MED ORDER — OXYBUTYNIN CHLORIDE 5 MG PO TABS: 5.0000 mg | ORAL_TABLET | Freq: Two times a day (BID) | ORAL | 6 refills | Status: DC

## 2020-09-12 MED ORDER — HYDRALAZINE HCL 25 MG PO TABS: ORAL_TABLET | ORAL | 1 refills | Status: DC

## 2020-09-12 MED ORDER — MEMANTINE HCL 10 MG PO TABS: ORAL_TABLET | ORAL | 6 refills | Status: DC

## 2020-09-12 MED ORDER — ATORVASTATIN CALCIUM 40 MG PO TABS: ORAL_TABLET | ORAL | 1 refills | Status: DC

## 2020-09-12 MED ORDER — CARVEDILOL 25 MG PO TABS: ORAL_TABLET | ORAL | 1 refills | Status: DC

## 2020-09-12 MED ORDER — TRAZODONE HCL 50 MG PO TABS: 50.0000 mg | ORAL_TABLET | Freq: Every evening | ORAL | 3 refills | Status: DC | PRN

## 2020-09-12 NOTE — Progress Notes (Signed)
Daughter states that patient is very forgetful and suffers from dementia.  Family has noticed a change in the mental state.

## 2020-09-12 NOTE — Patient Instructions (Signed)
Dementia Caregiver Guide Dementia is a term used to describe a number of symptoms that affect memory and thinking. The most common symptoms include:  Memory loss.  Trouble with language and communication.  Trouble concentrating.  Poor judgment.  Problems with reasoning.  Child-like behavior and language.  Extreme anxiety.  Angry outbursts.  Wandering from home or public places. Dementia usually gets worse slowly over time. In the early stages, people with dementia can stay independent and safe with some help. In later stages, they need help with daily tasks such as dressing, grooming, and using the bathroom. How to help the person with dementia cope Dementia can be frightening and confusing. Here are some tips to help the person with dementia cope with changes caused by the disease. General tips  Keep the person on track with his or her routine.  Try to identify areas where the person may need help.  Be supportive, patient, calm, and encouraging.  Gently remind the person that adjusting to changes takes time.  Help with the tasks that the person has asked for help with.  Keep the person involved in daily tasks and decisions as much as possible.  Encourage conversation, but try not to get frustrated or harried if the person struggles to find words or does not seem to appreciate your help. Communication tips  When the person is talking or seems frustrated, make eye contact and hold the person's hand.  Ask specific questions that need yes or no answers.  Use simple words, short sentences, and a calm voice. Only give one direction at a time.  When offering choices, limit them to just 1 or 2.  Avoid correcting the person in a negative way.  If the person is struggling to find the right words, gently try to help him or her. How to recognize symptoms of stress Symptoms of stress in caregivers include:  Feeling frustrated or angry with the person with  dementia.  Denying that the person has dementia or that his or her symptoms will not improve.  Feeling hopeless and unappreciated.  Difficulty sleeping.  Difficulty concentrating.  Feeling anxious, irritable, or depressed.  Developing stress-related health problems.  Feeling like you have too little time for your own life. Follow these instructions at home:   Make sure that you and the person you are caring for: ? Get regular sleep. ? Exercise regularly. ? Eat regular, nutritious meals. ? Drink enough fluid to keep your urine clear or pale yellow. ? Take over-the-counter and prescription medicines only as told by your health care providers. ? Attend all scheduled health care appointments.  Join a support group with others who are caregivers.  Ask about respite care resources so that you can have a regular break from the stress of caregiving.  Look for signs of stress in yourself and in the person you are caring for. If you notice signs of stress, take steps to manage it.  Consider any safety risks and take steps to avoid them.  Organize medications in a pill box for each day of the week.  Create a plan to handle any legal or financial matters. Get legal or financial advice if needed.  Keep a calendar in a central location to remind the person of appointments or other activities. Tips for reducing the risk of injury  Keep floors clear of clutter. Remove rugs, magazine racks, and floor lamps.  Keep hallways well lit, especially at night.  Put a handrail and nonslip mat in the bathtub or   shower.  Put childproof locks on cabinets that contain dangerous items, such as medicines, alcohol, guns, toxic cleaning items, sharp tools or utensils, matches, and lighters.  Put the locks in places where the person cannot see or reach them easily. This will help ensure that the person does not wander out of the house and get lost.  Be prepared for emergencies. Keep a list of  emergency phone numbers and addresses in a convenient area.  Remove car keys and lock garage doors so that the person does not try to get in the car and drive.  Have the person wear a bracelet that tracks locations and identifies the person as having memory problems. This should be worn at all times for safety. Where to find support: Many individuals and organizations offer support. These include:  Support groups for people with dementia and for caregivers.  Counselors or therapists.  Home health care services.  Adult day care centers. Where to find more information Alzheimer's Association: www.alz.org Contact a health care provider if:  The person's health is rapidly getting worse.  You are no longer able to care for the person.  Caring for the person is affecting your physical and emotional health.  The person threatens himself or herself, you, or anyone else. Summary  Dementia is a term used to describe a number of symptoms that affect memory and thinking.  Dementia usually gets worse slowly over time.  Take steps to reduce the person's risk of injury, and to plan for future care.  Caregivers need support, relief from caregiving, and time for their own lives. This information is not intended to replace advice given to you by your health care provider. Make sure you discuss any questions you have with your health care provider. Document Revised: 10/31/2017 Document Reviewed: 10/22/2016 Elsevier Patient Education  2020 [employer]  

## 2020-09-12 NOTE — Progress Notes (Signed)
Subjective:  Patient ID: [place], female    DOB: 11-May-1935  Age: 85 y.o. MRN: [id number]  CC: Diabetes   HPI Caroline Cooper is an 85 year old female with a history of hypertension, type 2 diabetes mellitus (A1c6.4), depression, Dementia,overactive bladder whois seentoday for a follow-up visit Complains of 6/10 pain in L side of neck x 1 week. Slept wrong on recliner and has been applying Voltaren gel which has been beneficial.  She has urinary frequency and urge incontinence, also has insomnia and this is causing her husband much distress as she sometimes goes to the bathroom and forgets what she went in there for..  Patient is not open to taking medications; had previously placed on oxybutynin which she declined taking.  I had also placed her on Aricept in the past which she declined taking. Son states he has been hallucinating, is becoming aggressive with spouse.  She confuses her daughter and his sister's names and has been forgetful. Referred to neurology and she was seen by Dr. Leta Baptist with a diagnosis of moderate dementia with recommendations for initiation of Namenda, Aricept, trazodone, SSRI.  Her blood pressure is elevated today and daughter is not sure if she took her medications as the patient has been resistant to medications.  Her A1c has also crept up to 6.4 from 6.1 previously and she endorses ingesting a lot of carbs.   Past Medical History:  Diagnosis Date  . Diabetes mellitus without complication ([place])   . Diverticulitis   . DM (diabetes mellitus) ([place]) 09/24/2015  . HTN (hypertension) 09/24/2015  . Hyperlipidemia 09/24/2015  . Hypertension     Past Surgical History:  Procedure Laterality Date  . ABDOMINAL HYSTERECTOMY    . CHOLECYSTECTOMY    . COLON SURGERY     COLECTOMY FOR DIVERTICULITIS  . HERNIA REPAIR      Family History  Problem Relation Age of Onset  . Hypertension Other     Allergies  Allergen Reactions  .  Penicillins Rash     Has patient had a PCN reaction causing immediate rash, facial/tongue/throat swelling, SOB or lightheadedness with hypotension: No Has patient had a PCN reaction causing severe rash involving mucus membranes or skin necrosis: No Has patient had a PCN reaction that required hospitalization No Has patient had a PCN reaction occurring within the last 10 years: No If all of the above answers are "NO", then may proceed with Cephalosporin use.     Outpatient Medications Prior to Visit  Medication Sig Dispense Refill  . acetaminophen (TYLENOL) 500 MG tablet Take 1,000 mg by mouth every 8 (eight) hours as needed for moderate pain or headache.    Marland Kitchen atorvastatin (LIPITOR) 40 MG tablet TAKE 1 TABLET(40 MG) BY MOUTH DAILY 90 tablet 1  . carvedilol (COREG) 25 MG tablet TAKE 1 TABLET(25 MG) BY MOUTH TWICE DAILY WITH A MEAL 180 tablet 1  . hydrALAZINE (APRESOLINE) 25 MG tablet TAKE 1 TABLET(25 MG) BY MOUTH TWICE DAILY 180 tablet 1  . olopatadine (PATANOL) 0.1 % ophthalmic solution Place 1 drop into both eyes 2 (two) times daily. (Patient not taking: Reported on 09/03/2018) 5 mL 2  . oxybutynin (DITROPAN) 5 MG tablet Take 1 tablet (5 mg total) by mouth 2 (two) times daily. (Patient not taking: Reported on 05/24/2020) 60 tablet 6   No facility-administered medications prior to visit.     ROS Review of Systems  Constitutional: Negative for activity change, appetite change and fatigue.  HENT: Negative for  congestion, sinus pressure and sore throat.   Eyes: Negative for visual disturbance.  Respiratory: Negative for cough, chest tightness, shortness of breath and wheezing.   Cardiovascular: Negative for chest pain and palpitations.  Gastrointestinal: Negative for abdominal distention, abdominal pain and constipation.  Endocrine: Negative for polydipsia.  Genitourinary: Negative for dysuria and frequency.  Musculoskeletal:       See HPI  Skin: Negative for rash.  Neurological:  Negative for tremors, light-headedness and numbness.  Hematological: Does not bruise/bleed easily.  Psychiatric/Behavioral: Positive for behavioral problems and sleep disturbance. Negative for agitation.    Objective:  BP (!) 164/98   Pulse 64   Ht 5' 4"  (1.626 m)   Wt 168 lb 3.2 oz (76.3 kg)   SpO2 99%   BMI 28.87 kg/m   BP/Weight 09/12/2020 05/24/2020 12/16/5206  Systolic BP 022 336 122  Diastolic BP 98 77 77  Wt. (Lbs) 168.2 167 169.2  BMI 28.87 28.67 29.04      Physical Exam Constitutional:      Appearance: She is well-developed.  Neck:     Vascular: No JVD.  Cardiovascular:     Rate and Rhythm: Normal rate.     Heart sounds: Normal heart sounds. No murmur heard.   Pulmonary:     Effort: Pulmonary effort is normal.     Breath sounds: Normal breath sounds. No wheezing or rales.  Chest:     Chest wall: No tenderness.  Abdominal:     General: Bowel sounds are normal. There is no distension.     Palpations: Abdomen is soft. There is no mass.     Tenderness: There is no abdominal tenderness.  Musculoskeletal:        General: Normal range of motion.     Right lower leg: No edema.     Left lower leg: No edema.  Neurological:     Mental Status: She is alert and oriented to person, place, and time.  Psychiatric:        Mood and Affect: Mood normal.     CMP Latest Ref Rng & Units 04/10/2020 09/22/2019 09/19/2019  Glucose 65 - 99 mg/dL 105(H) 117(H) 99  BUN 8 - 27 mg/dL 16 12 12   Creatinine 0.57 - 1.00 mg/dL 0.90 1.04(H) 0.82  Sodium 134 - 144 mmol/L 141 141 138  Potassium 3.5 - 5.2 mmol/L 4.3 4.6 4.2  Chloride 96 - 106 mmol/L 102 98 106  CO2 20 - 29 mmol/L 25 26 25   Calcium 8.7 - 10.3 mg/dL 9.3 9.5 8.4(L)  Total Protein 6.0 - 8.5 g/dL - 6.8 -  Total Bilirubin 0.0 - 1.2 mg/dL - 0.7 -  Alkaline Phos 39 - 117 IU/L - 92 -  AST 0 - 40 IU/L - 22 -  ALT 0 - 32 IU/L - 15 -    Lipid Panel     Component Value Date/Time   CHOL 140 09/03/2018 1127   TRIG 122  09/03/2018 1127   HDL 56 09/03/2018 1127   CHOLHDL 2.5 09/03/2018 1127   CHOLHDL 3.0 10/03/2016 0859   VLDL 26 10/03/2016 0859   LDLCALC 60 09/03/2018 1127    CBC    Component Value Date/Time   WBC 8.5 09/22/2019 0949   WBC 8.9 09/17/2019 0154   RBC 3.93 09/22/2019 0949   RBC 3.96 09/17/2019 0154   HGB 12.0 09/22/2019 0949   HCT 35.6 09/22/2019 0949   PLT 186 09/22/2019 0949   MCV 91 09/22/2019 0949   MCH 30.5 09/22/2019  0949   MCH 31.1 09/17/2019 0154   MCHC 33.7 09/22/2019 0949   MCHC 35.4 09/17/2019 0154   RDW 13.4 09/22/2019 0949   LYMPHSABS 2.5 09/22/2019 0949   MONOABS 0.6 09/16/2019 1913   EOSABS 0.2 09/22/2019 0949   BASOSABS 0.0 09/22/2019 0949    Lab Results  Component Value Date   HGBA1C 6.4 09/12/2020    Assessment & Plan:  1. Type 2 diabetes mellitus with other specified complication, without long-term current use of insulin (HCC) Controlled with A1c of 6.4 Diet controlled Advised to work on lifestyle modifications and diet - POCT glycosylated hemoglobin (Hb A1C) - CMP14+EGFR - Microalbumin / creatinine urine ratio  2. Overactive bladder Uncontrolled She is willing to try medication today - oxybutynin (DITROPAN) 5 MG tablet; Take 1 tablet (5 mg total) by mouth 2 (two) times daily.  Dispense: 60 tablet; Refill: 6 - POCT URINALYSIS DIP (CLINITEK)  3. Essential hypertension Uncontrolled Compliance with medications cannot be ascertained Emphasized the need to be compliant with medications Given ongoing behavioral disturbances associated with her dementia compliance is a challenge Counseled on blood pressure goal of less than 130/80, low-sodium, DASH diet, medication compliance, 150 minutes of moderate intensity exercise per week. Discussed medication compliance, adverse effects. - hydrALAZINE (APRESOLINE) 25 MG tablet; TAKE 1 TABLET(25 MG) BY MOUTH TWICE DAILY  Dispense: 180 tablet; Refill: 1 - carvedilol (COREG) 25 MG tablet; TAKE 1 TABLET(25 MG) BY  MOUTH TWICE DAILY WITH A MEAL  Dispense: 180 tablet; Refill: 1  4. Hyperlipidemia associated with type 2 diabetes mellitus ([place]) Controlled Due for repeat lipid panel We will order when she is fasting - atorvastatin (LIPITOR) 40 MG tablet; TAKE 1 TABLET(40 MG) BY MOUTH DAILY  Dispense: 90 tablet; Refill: 1  5. Dementia with behavioral disturbance, unspecified dementia type ([place]) She is not open to taking medications After shared decision making with the patient and family she promises to try taking Namenda We will consider adding nighttime Aricept per neurology recommendation the patient is agreeable at next visit We will work on obtaining PCS services for caregiver support Consider SSRI down the road - memantine (NAMENDA) 10 MG tablet; Take orally 10 mg daily at bedtime for 2 weeks then 10 mg twice daily thereafter  Dispense: 60 tablet; Refill: 6  6. Other insomnia Overactive bladder also contributing - traZODone (DESYREL) 50 MG tablet; Take 1 tablet (50 mg total) by mouth at bedtime as needed for sleep. For sleep  Dispense: 30 tablet; Refill: 3   Return in about 6 months (around 03/13/2021) for Chronic disease management.     Charlott Rakes, MD, FAAFP. [facility] and [place], [place]   09/12/2020, 2:08 PM

## 2020-09-13 ENCOUNTER — Telehealth: Payer: Self-pay

## 2020-09-13 LAB — CMP14+EGFR
ALT: 15 IU/L (ref 0–32)
AST: 15 IU/L (ref 0–40)
Albumin/Globulin Ratio: 1.7 (ref 1.2–2.2)
Albumin: 4.5 g/dL (ref 3.6–4.6)
Alkaline Phosphatase: 113 IU/L (ref 44–121)
BUN/Creatinine Ratio: 21 (ref 12–28)
BUN: 21 mg/dL (ref 8–27)
Bilirubin Total: 0.5 mg/dL (ref 0.0–1.2)
CO2: 27 mmol/L (ref 20–29)
Calcium: 9.1 mg/dL (ref 8.7–10.3)
Chloride: 101 mmol/L (ref 96–106)
Creatinine, Ser: 0.98 mg/dL (ref 0.57–1.00)
GFR calc Af Amer: 62 mL/min/{1.73_m2} (ref >59)
GFR calc non Af Amer: 54 mL/min/{1.73_m2} — ABNORMAL LOW (ref >59)
Globulin, Total: 2.7 g/dL (ref 1.5–4.5)
Glucose: 83 mg/dL (ref 65–99)
Potassium: 4.4 mmol/L (ref 3.5–5.2)
Sodium: 143 mmol/L (ref 134–144)
Total Protein: 7.2 g/dL (ref 6.0–8.5)

## 2020-09-13 LAB — MICROALBUMIN / CREATININE URINE RATIO
Creatinine, Urine: 33.3 mg/dL
Microalb/Creat Ratio: 44 mg/g creat — ABNORMAL HIGH (ref 0–29)
Microalbumin, Urine: 14.5 ug/mL

## 2020-09-13 NOTE — Telephone Encounter (Signed)
Opal Sidles, assistance is needed for this patient's husband who is the major caregiver due to patient's ongoing dementia with associated behavioral symptoms. If [facility] would provide support for the spouse I am happy to have them referred there after you speak with them to discuss services available.  I am also happy to speak to them after you have discussed this issue with them but I would like you to do so first.  Thank you

## 2020-09-13 NOTE — Telephone Encounter (Signed)
Per Opal Sidles patient does not qualify for PCS service due to her insurance.  Opal Sidles suggested that we [facility].

## 2020-09-15 ENCOUNTER — Telehealth: Payer: Self-pay

## 2020-09-15 NOTE — Telephone Encounter (Signed)
Patient was called and a voicemail was left informing patient to return phone call for lab results. 

## 2020-09-15 NOTE — Telephone Encounter (Signed)
-----   Message from Charlott Rakes, MD sent at 09/13/2020  2:11 PM EDT ----- Labs reveal normal liver functions, normal electrolytes but her kidney is starting to spill out little protein which is likely from the diabetes.  Please advised to work on diabetic diet and other lifestyle modifications.

## 2020-09-19 ENCOUNTER — Encounter: Payer: Self-pay | Admitting: Family Medicine

## 2020-10-17 ENCOUNTER — Telehealth: Payer: Self-pay

## 2020-10-17 NOTE — Telephone Encounter (Signed)
Call placed to patient's daughter, Watt Climes to inquire if she has been able to contact [facility].   She explained that she has not contacted them yet and today her father was rushed to the hospital with chest pain. She is waiting to hear from the doctor about his current status.  She said that she will now need to address her father's needs as well as her mother's needs. Explained to her that she can reach out to the hospital SW or CM for information about resources for her father.  Also explained to her that she can reach out to [facility] to also address resources for her father.

## 2020-10-25 ENCOUNTER — Telehealth: Payer: Self-pay

## 2020-10-25 NOTE — Telephone Encounter (Signed)
Call placed to patient's daughter, Watt Climes # [phone], message left with call back requested to this CM.

## 2020-12-12 ENCOUNTER — Encounter: Payer: Self-pay | Admitting: Family Medicine

## 2020-12-12 ENCOUNTER — Other Ambulatory Visit: Payer: Self-pay

## 2020-12-12 ENCOUNTER — Telehealth: Payer: Self-pay

## 2020-12-12 ENCOUNTER — Ambulatory Visit: Payer: Medicare Other | Attending: Family Medicine | Admitting: Family Medicine

## 2020-12-12 DIAGNOSIS — F0391 Unspecified dementia with behavioral disturbance: Secondary | ICD-10-CM | POA: Diagnosis not present

## 2020-12-12 NOTE — Telephone Encounter (Signed)
Another call  placed to [facility].  She explained that her mother's dementia is progressing and she has been experiencing more hallucinations.  Her father's health is declining and he is the primary caregiver. She is inquiring about having additional help at home for her mother. At this time, they want to keep her mother at home.   Her mother only has medicare A/B which would only provide short term home health aide services if she qualified for SN and/or PT. She has not applied for medicaid for her mother and it is questionable if she would qualify for full medicaid based on her social security and her husband's pension. The application for medicaid can be filed out on-line.   She explained that they toured the [facility] facility and also discussed home based services provided by the program.  Watt Climes did not feel that the program would be a good fit for her mother. She said that they would not be able to provide enough home health aide services to benefit the patient.   This CM explained about CAP services and provided [facility] with the contact number. Her mother may qualify for special assistance medicaid for CAP and this program would provide longer term services in the home.  She does not have to have medicaid in place to apply for the program but there may be a wait list.   Watt Climes said that she was already familiar with [facility]. They were not interested at this time, again, the goal is trying to keep her mother at home.  Also discussed the option of private paying for home health aides/homemakers  but that can become quite costly - up to $20-25/hour.   This CM instructed her to contact this CM with further questions/concerns about services/ CAP going forward

## 2020-12-12 NOTE — Progress Notes (Signed)
Daughter wants to discuss pt's dementia and wanting to get help at home with pt.  Pt has not been taking her Dementia medications.

## 2020-12-12 NOTE — Telephone Encounter (Signed)
Attempted to contact patient's daughter, Watt Climes # [phone] to discuss home care services. Message left with call back requested to this CM.

## 2020-12-12 NOTE — Progress Notes (Signed)
Virtual Visit via Telephone Note  I connected with [place], on 12/12/2020 at 9:15 AM by telephone due to the COVID-19 pandemic and verified that I am speaking with the correct person using two identifiers.   Consent: I discussed the limitations, risks, security and privacy concerns of performing an evaluation and management service by telephone and the availability of in person appointments. I also discussed with the patient that there may be a patient responsible charge related to this service. The patient expressed understanding and agreed to proceed.   Location of Patient: Home  Location of Provider: Home   Persons participating in Telemedicine visit: San[place] Scarlett Cooper Charlette Caffey [place]-CMA Dr. Mercy Moore - patient's daughter     History of Present Illness: Caroline Cooper Caroline Cooper is an 86 year old female with a history of hypertension, type 2 diabetes mellitus (A1c6.4), depression, Dementia,overactive bladder whois seentoday for a follow-up visit  Her Mom did not want to leave the house today for the appointment which was originally thought to be in person. The patient has complained of pain between her neck and upper back which sometimes itches and sometimes it burns but on asking her now she denies presence of pain. Daughter states Mom does not take her medication and daughter is contemplating having to mix it in apple sauce She needs care at home. [facility] had [facility] which they think is not a fit for her Mom. Mom calls daughter by her Aunt's name. She is having behavioral issues and some hallucinations making it difficult for her Dad to care for the patient.   Past Medical History:  Diagnosis Date  . Diabetes mellitus without complication ([place])   . Diverticulitis   . DM (diabetes mellitus) ([place]) 09/24/2015  . HTN (hypertension) 09/24/2015  . Hyperlipidemia 09/24/2015  . Hypertension    Allergies  Allergen  Reactions  . Penicillins Rash     Has patient had a PCN reaction causing immediate rash, facial/tongue/throat swelling, SOB or lightheadedness with hypotension: No Has patient had a PCN reaction causing severe rash involving mucus membranes or skin necrosis: No Has patient had a PCN reaction that required hospitalization No Has patient had a PCN reaction occurring within the last 10 years: No If all of the above answers are "NO", then may proceed with Cephalosporin use.     Current Outpatient Medications on File Prior to Visit  Medication Sig Dispense Refill  . acetaminophen (TYLENOL) 500 MG tablet Take 1,000 mg by mouth every 8 (eight) hours as needed for moderate pain or headache.    Marland Kitchen atorvastatin (LIPITOR) 40 MG tablet TAKE 1 TABLET(40 MG) BY MOUTH DAILY 90 tablet 1  . carvedilol (COREG) 25 MG tablet TAKE 1 TABLET(25 MG) BY MOUTH TWICE DAILY WITH A MEAL 180 tablet 1  . hydrALAZINE (APRESOLINE) 25 MG tablet TAKE 1 TABLET(25 MG) BY MOUTH TWICE DAILY 180 tablet 1  . memantine (NAMENDA) 10 MG tablet Take orally 10 mg daily at bedtime for 2 weeks then 10 mg twice daily thereafter 60 tablet 6  . oxybutynin (DITROPAN) 5 MG tablet Take 1 tablet (5 mg total) by mouth 2 (two) times daily. 60 tablet 6  . traZODone (DESYREL) 50 MG tablet Take 1 tablet (50 mg total) by mouth at bedtime as needed for sleep. For sleep 30 tablet 3  . olopatadine (PATANOL) 0.1 % ophthalmic solution Place 1 drop into both eyes 2 (two) times daily. (Patient not taking: Reported on 09/03/2018) 5 mL  2   No current facility-administered medications on file prior to visit.    Observations/Objective: Awake, alert, oriented x3 Not in acute distress Neck : no TTP when daughter palpates and no hyperesthesia  Assessment and Plan: 1. Dementia with behavioral disturbance, unspecified dementia type ([place]) Moderate Dementia Unfortunately this is progressing and she will benefit from home care Discussed with case manager who has  reached out to [facility] the patient's daughter They are not interested in [place] but will be looking into CAP  Advised that the family will benefit from Dementia support groups as this can take a toll on the family   Follow Up Instructions: Keep previously scheduled appointment   I discussed the assessment and treatment plan with the patient. The patient was provided an opportunity to ask questions and all were answered. The patient agreed with the plan and demonstrated an understanding of the instructions.   The patient was advised to call back or seek an in-person evaluation if the symptoms worsen or if the condition fails to improve as anticipated.     I provided 23 minutes total of non-face-to-face time during this encounter including median intraservice time, reviewing previous notes, investigations, ordering medications, medical decision making, coordinating care and patient verbalized understanding at the end of the visit.     Charlott Rakes, MD, FAAFP. [facility] and [place], [place]   12/12/2020, 9:15 AM

## 2021-03-30 ENCOUNTER — Encounter: Payer: Self-pay | Admitting: Family Medicine

## 2021-04-25 ENCOUNTER — Encounter: Payer: Self-pay | Admitting: Family Medicine

## 2021-04-25 ENCOUNTER — Ambulatory Visit: Payer: Medicare Other | Attending: Family Medicine | Admitting: Family Medicine

## 2021-04-25 ENCOUNTER — Other Ambulatory Visit: Payer: Self-pay

## 2021-04-25 VITALS — BP 175/72 | HR 52 | Temp 98.0°F | Ht 64.0 in | Wt 171.0 lb

## 2021-04-25 DIAGNOSIS — E1169 Type 2 diabetes mellitus with other specified complication: Secondary | ICD-10-CM | POA: Diagnosis not present

## 2021-04-25 DIAGNOSIS — R6889 Other general symptoms and signs: Secondary | ICD-10-CM

## 2021-04-25 DIAGNOSIS — Z8249 Family history of ischemic heart disease and other diseases of the circulatory system: Secondary | ICD-10-CM | POA: Insufficient documentation

## 2021-04-25 DIAGNOSIS — I10 Essential (primary) hypertension: Secondary | ICD-10-CM | POA: Diagnosis not present

## 2021-04-25 DIAGNOSIS — Z79899 Other long term (current) drug therapy: Secondary | ICD-10-CM | POA: Diagnosis not present

## 2021-04-25 DIAGNOSIS — N3281 Overactive bladder: Secondary | ICD-10-CM | POA: Insufficient documentation

## 2021-04-25 DIAGNOSIS — F32A Depression, unspecified: Secondary | ICD-10-CM | POA: Insufficient documentation

## 2021-04-25 DIAGNOSIS — E785 Hyperlipidemia, unspecified: Secondary | ICD-10-CM | POA: Diagnosis not present

## 2021-04-25 DIAGNOSIS — F0391 Unspecified dementia with behavioral disturbance: Secondary | ICD-10-CM | POA: Diagnosis not present

## 2021-04-25 LAB — POCT GLYCOSYLATED HEMOGLOBIN (HGB A1C): Hemoglobin A1C: 6.7 % — AB (ref 4.0–5.6)

## 2021-04-25 LAB — GLUCOSE, POCT (MANUAL RESULT ENTRY): POC Glucose: 234 mg/dl — AB (ref 70–99)

## 2021-04-25 MED ORDER — ATORVASTATIN CALCIUM 40 MG PO TABS: ORAL_TABLET | ORAL | 1 refills | Status: DC

## 2021-04-25 MED ORDER — HYDRALAZINE HCL 50 MG PO TABS: 50.0000 mg | ORAL_TABLET | Freq: Two times a day (BID) | ORAL | 1 refills | Status: DC

## 2021-04-25 MED ORDER — CARVEDILOL 25 MG PO TABS: ORAL_TABLET | ORAL | 1 refills | Status: DC

## 2021-04-25 NOTE — Progress Notes (Signed)
Subjective:  Patient ID: Snelling, female    DOB: July 19, 1935  Age: 86 y.o. MRN: [id number]  CC: No chief complaint on file.   HPI [place] is an 86 year old female with a history of hypertension, type 2 diabetes mellitus (diet controlled with A1c6.7), depression,Dementia,overactive bladder whois seentoday for a follow-up visit  Interval History: She states she goes walking a lot but daughter denies this and states patient has been sedentary. A1c 6.7 up from 6.4 and daughter states mom has been eating a lot of cookies. Her blood pressure is elevated and she endorses compliance with her medication.  Daughter confirms that the patient's spouse has been ensuring she takes her antihypertensives (carvedilol and hydralazine) and atorvastatin.  She is on Namenda but refuses to take Namenda.  The patient has been unable to secure home health services due to lack of Medicaid and has applied for CAP services which is still pending.  Family is noticing gradual deterioration in her condition and behavior.  She mixes up her days and nights sometimes and mixes up names.  She is getting more difficult when family suggests that she needs to go for a walk.  Her major caregiver is her spouse and the children try to help. The patient complains of cold intolerance. Past Medical History:  Diagnosis Date  . Diabetes mellitus without complication ([place])   . Diverticulitis   . DM (diabetes mellitus) ([place]) 09/24/2015  . HTN (hypertension) 09/24/2015  . Hyperlipidemia 09/24/2015  . Hypertension     Past Surgical History:  Procedure Laterality Date  . ABDOMINAL HYSTERECTOMY    . CHOLECYSTECTOMY    . COLON SURGERY     COLECTOMY FOR DIVERTICULITIS  . HERNIA REPAIR      Family History  Problem Relation Age of Onset  . Hypertension Other     Allergies  Allergen Reactions  . Penicillins Rash     Has patient had a PCN reaction causing immediate rash,  facial/tongue/throat swelling, SOB or lightheadedness with hypotension: No Has patient had a PCN reaction causing severe rash involving mucus membranes or skin necrosis: No Has patient had a PCN reaction that required hospitalization No Has patient had a PCN reaction occurring within the last 10 years: No If all of the above answers are "NO", then may proceed with Cephalosporin use.     Outpatient Medications Prior to Visit  Medication Sig Dispense Refill  . acetaminophen (TYLENOL) 500 MG tablet Take 1,000 mg by mouth every 8 (eight) hours as needed for moderate pain or headache.    Marland Kitchen atorvastatin (LIPITOR) 40 MG tablet TAKE 1 TABLET(40 MG) BY MOUTH DAILY 90 tablet 1  . carvedilol (COREG) 25 MG tablet TAKE 1 TABLET(25 MG) BY MOUTH TWICE DAILY WITH A MEAL 180 tablet 1  . hydrALAZINE (APRESOLINE) 25 MG tablet TAKE 1 TABLET(25 MG) BY MOUTH TWICE DAILY 180 tablet 1  . memantine (NAMENDA) 10 MG tablet Take orally 10 mg daily at bedtime for 2 weeks then 10 mg twice daily thereafter (Patient not taking: Reported on 04/25/2021) 60 tablet 6  . oxybutynin (DITROPAN) 5 MG tablet Take 1 tablet (5 mg total) by mouth 2 (two) times daily. (Patient not taking: Reported on 04/25/2021) 60 tablet 6  . traZODone (DESYREL) 50 MG tablet Take 1 tablet (50 mg total) by mouth at bedtime as needed for sleep. For sleep (Patient not taking: Reported on 04/25/2021) 30 tablet 3  . olopatadine (PATANOL) 0.1 % ophthalmic solution Place 1 drop into  both eyes 2 (two) times daily. (Patient not taking: Reported on 09/03/2018) 5 mL 2   No facility-administered medications prior to visit.     ROS Review of Systems  Constitutional: Negative for activity change, appetite change and fatigue.  HENT: Negative for congestion, sinus pressure and sore throat.   Eyes: Negative for visual disturbance.  Respiratory: Negative for cough, chest tightness, shortness of breath and wheezing.   Cardiovascular: Negative for chest pain and  palpitations.  Gastrointestinal: Negative for abdominal distention, abdominal pain and constipation.  Endocrine: Negative for polydipsia.  Genitourinary: Negative for dysuria and frequency.  Musculoskeletal: Negative for arthralgias and back pain.  Skin: Negative for rash.  Neurological: Negative for tremors, light-headedness and numbness.  Hematological: Does not bruise/bleed easily.  Psychiatric/Behavioral: Positive for behavioral problems. Negative for agitation.    Objective:  BP (!) 175/72 (BP Location: Right Arm, Patient Position: Sitting, Cuff Size: Large)   Pulse (!) 52   Temp 98 F (36.7 C) (Oral)   Ht 5' 4"  (1.626 m)   Wt 171 lb (77.6 kg)   SpO2 98%   BMI 29.35 kg/m   BP/Weight 04/25/2021 09/12/2020 3/66/2947  Systolic BP 654 650 354  Diastolic BP 72 98 77  Wt. (Lbs) 171 168.2 167  BMI 29.35 28.87 28.67      Physical Exam Constitutional:      Appearance: She is well-developed.  Neck:     Vascular: No JVD.  Cardiovascular:     Rate and Rhythm: Bradycardia present.     Heart sounds: Normal heart sounds. No murmur heard.   Pulmonary:     Effort: Pulmonary effort is normal.     Breath sounds: Normal breath sounds. No wheezing or rales.  Chest:     Chest wall: No tenderness.  Abdominal:     General: Bowel sounds are normal. There is no distension.     Palpations: Abdomen is soft. There is no mass.     Tenderness: There is no abdominal tenderness.  Musculoskeletal:        General: Normal range of motion.     Right lower leg: No edema.     Left lower leg: No edema.  Neurological:     Mental Status: She is alert and oriented to person, place, and time.     Gait: Gait abnormal (slow gait with difficulty transferring from exam table to chair with assistance).  Psychiatric:        Mood and Affect: Mood normal.     CMP Latest Ref Rng & Units 09/12/2020 04/10/2020 09/22/2019  Glucose 65 - 99 mg/dL 83 105(H) 117(H)  BUN 8 - 27 mg/dL 21 16 12   Creatinine 0.57  - 1.00 mg/dL 0.98 0.90 1.04(H)  Sodium 134 - 144 mmol/L 143 141 141  Potassium 3.5 - 5.2 mmol/L 4.4 4.3 4.6  Chloride 96 - 106 mmol/L 101 102 98  CO2 20 - 29 mmol/L 27 25 26   Calcium 8.7 - 10.3 mg/dL 9.1 9.3 9.5  Total Protein 6.0 - 8.5 g/dL 7.2 - 6.8  Total Bilirubin 0.0 - 1.2 mg/dL 0.5 - 0.7  Alkaline Phos 44 - 121 IU/L 113 - 92  AST 0 - 40 IU/L 15 - 22  ALT 0 - 32 IU/L 15 - 15    Lipid Panel     Component Value Date/Time   CHOL 140 09/03/2018 1127   TRIG 122 09/03/2018 1127   HDL 56 09/03/2018 1127   CHOLHDL 2.5 09/03/2018 1127   CHOLHDL 3.0 10/03/2016  0859   VLDL 26 10/03/2016 0859   LDLCALC 60 09/03/2018 1127    CBC    Component Value Date/Time   WBC 8.5 09/22/2019 0949   WBC 8.9 09/17/2019 0154   RBC 3.93 09/22/2019 0949   RBC 3.96 09/17/2019 0154   HGB 12.0 09/22/2019 0949   HCT 35.6 09/22/2019 0949   PLT 186 09/22/2019 0949   MCV 91 09/22/2019 0949   MCH 30.5 09/22/2019 0949   MCH 31.1 09/17/2019 0154   MCHC 33.7 09/22/2019 0949   MCHC 35.4 09/17/2019 0154   RDW 13.4 09/22/2019 0949   LYMPHSABS 2.5 09/22/2019 0949   MONOABS 0.6 09/16/2019 1913   EOSABS 0.2 09/22/2019 0949   BASOSABS 0.0 09/22/2019 0949    Lab Results  Component Value Date   HGBA1C 6.7 (A) 04/25/2021    Assessment & Plan:  1. Type 2 diabetes mellitus with other specified complication, without long-term current use of insulin (HCC) Controlled with A1c of 6.7 which has trended up slightly from 6.4; goal is less than 7.0 This is diet controlled No regimen change today Counseled on Diabetic diet, my plate method, 465 minutes of moderate intensity exercise/week Blood sugar logs with fasting goals of 80-120 mg/dl, random of less than 180 and in the event of sugars less than 60 mg/dl or greater than 400 mg/dl encouraged to notify the clinic. Advised on the need for annual eye exams, annual foot exams, Pneumonia vaccine. - HgB A1c - Glucose (CBG) - CMP14+EGFR  2. Essential  hypertension Uncontrolled Increased dose of hydralazine Counseled on blood pressure goal of less than 130/80, low-sodium, DASH diet, medication compliance, 150 minutes of moderate intensity exercise per week. Discussed medication compliance, adverse effects. - hydrALAZINE (APRESOLINE) 50 MG tablet; Take 1 tablet (50 mg total) by mouth in the morning and at bedtime.  Dispense: 180 tablet; Refill: 1 - carvedilol (COREG) 25 MG tablet; TAKE 1 TABLET(25 MG) BY MOUTH TWICE DAILY WITH A MEAL  Dispense: 180 tablet; Refill: 1  3. Hyperlipidemia associated with type 2 diabetes mellitus ([place]) She has not had a repeat lipid level given she has not been fasting Continue atorvastatin Low-cholesterol diet - atorvastatin (LIPITOR) 40 MG tablet; TAKE 1 TABLET(40 MG) BY MOUTH DAILY  Dispense: 90 tablet; Refill: 1  4. Dementia with behavioral disturbance, unspecified dementia type ([place]) This is progressing She has declined taking Namenda Discussed with daughter the progressive nature of dementia Her gait has also worsened especially due to her sedentary nature.  Advised that she would need to go walking some more with assistance of family members and will start by getting the mail every day. Caregiver support is important and she will benefit from home health services Discussed with the daughter the option of private agencies for home care while CAP Services are pending  5. Cold intolerance - T4, free - CBC with Differential/Platelet - TSH   Meds ordered this encounter  Medications  . hydrALAZINE (APRESOLINE) 50 MG tablet    Sig: Take 1 tablet (50 mg total) by mouth in the morning and at bedtime.    Dispense:  180 tablet    Refill:  1    Dose increase  . atorvastatin (LIPITOR) 40 MG tablet    Sig: TAKE 1 TABLET(40 MG) BY MOUTH DAILY    Dispense:  90 tablet    Refill:  1  . carvedilol (COREG) 25 MG tablet    Sig: TAKE 1 TABLET(25 MG) BY MOUTH TWICE DAILY WITH A MEAL  Dispense:  180 tablet     Refill:  1    Follow-up: Return in about 6 months (around 10/26/2021) for Medical conditions.       Charlott Rakes, MD, FAAFP. [facility] [place], [place]   04/25/2021, 11:43 AM

## 2021-04-25 NOTE — Progress Notes (Signed)
Pt daughter wrote note on screening form stating that alzheimer's/dementia has progressed

## 2021-04-25 NOTE — Patient Instructions (Signed)
https://point-[email or web address]-care.elsevierperformancemanager.com/skills">  Dementia Caregiver Guide Dementia is a term used to describe a number of symptoms that affect memory and thinking. The most common symptoms include:  Memory loss.  Trouble with language and communication.  Trouble concentrating.  Poor judgment and problems with reasoning.  Wandering from home or public places.  Extreme anxiety or depression.  Being suspicious or having angry outbursts and accusations.  Child-like behavior and language. Dementia can be frightening and confusing. And taking care of someone with dementia can be challenging. This guide provides tips to help you when providing care for a person with dementia. How to help manage lifestyle changes Dementia usually gets worse slowly over time. In the early stages, people with dementia can stay independent and safe with some help. In later stages, they need help with daily tasks such as dressing, grooming, and using the bathroom. There are actions you can take to help a person manage his or her life while living with this condition. Communicating  When the person is talking or seems frustrated, make eye contact and hold the person's hand.  Ask specific questions that need yes or no answers.  Use simple words, short sentences, and a calm voice. Only give one direction at a time.  When offering choices, limit the person to just one or two.  Avoid correcting the person in a negative way.  If the person is struggling to find the right words, gently try to help him or her. Preventing injury  Keep floors clear of clutter. Remove rugs, magazine racks, and floor lamps.  Keep hallways well lit, especially at night.  Put a handrail and nonslip mat in the bathtub or shower.  Put childproof locks on cabinets that contain dangerous items, such as medicines, alcohol, guns, toxic cleaning items, sharp tools or utensils, matches, and lighters.  For doors to the  outside of the house, put the locks in places where the person cannot see or reach them easily. This will help ensure that the person does not wander out of the house and get lost.  Be prepared for emergencies. Keep a list of emergency phone numbers and addresses in a convenient area.  Remove car keys and lock garage doors so that the person does not try to get in the car and drive.  Have the person wear a bracelet that tracks locations and identifies the person as having memory problems. This should be worn at all times for safety.   Helping with daily life  Keep the person on track with his or her routine.  Try to identify areas where the person may need help.  Be supportive, patient, calm, and encouraging.  Gently remind the person that adjusting to changes takes time.  Help with the tasks that the person has asked for help with.  Keep the person involved in daily tasks and decisions as much as possible.  Encourage conversation, but try not to get frustrated if the person struggles to find words or does not seem to appreciate your help.   How to recognize stress Look for signs of stress in yourself and in the person you are caring for. If you notice signs of stress, take steps to manage it. Symptoms of stress include:  Feeling anxious, irritable, frustrated, or angry.  Denying that the person has dementia or that his or her symptoms will not improve.  Feeling depressed, hopeless, or unappreciated.  Difficulty sleeping.  Difficulty concentrating.  Developing stress-related health problems.  Feeling like you have too little time  for your own life. Follow these instructions at home: Take care of your health Make sure that you and the person you are caring for:  Get regular sleep.  Exercise regularly.  Eat regular, nutritious meals.  Take over-the-counter and prescription medicines only as told by your health care providers.  Drink enough fluid to keep your urine pale  yellow.  Attend all scheduled health care appointments.   General instructions  Join a support group with others who are caregivers.  Ask about respite care resources. Respite care can provide short-term care for the person so that you can have a regular break from the stress of caregiving.  Consider any safety risks and take steps to avoid them.  Organize medicines in a pill box for each day of the week.  Create a plan to handle any legal or financial matters. Get legal or financial advice if needed.  Keep a calendar in a central location to remind the person of appointments or other activities. Where to find support: Many individuals and organizations offer support. These include:  Support groups for people with dementia.  Support groups for caregivers.  Counselors or therapists.  Home health care services.  Adult day care centers. Where to find more information  Centers for Disease Control and Prevention: http://www.wolf.info/  Alzheimer's Association: [place].co.nz  Family Caregiver Alliance: www.caregiver.[place]: www.alzfdn.org Contact a health care provider if:  The person's health is rapidly getting worse.  You are no longer able to care for the person.  Caring for the person is affecting your physical and emotional health.  You are feeling depressed or anxious about caring for the person. Get help right away if:  The person threatens himself or herself, you, or anyone else.  You feel depressed or sad, or feel that you want to harm yourself. If you ever feel like your loved one may hurt himself or herself or others, or if he or she shares thoughts about taking his or her own life, get help right away. You can go to your nearest emergency department or:  Call your local emergency services (911 in the [place]  Call a suicide crisis helpline, such as the [place] at 914-680-2487. This is open 24 hours a day in  the [place]  Text the Crisis Text Line at 773-264-0673 (in the [place].). Summary  Dementia is a term used to describe a number of symptoms that affect memory and thinking.  Dementia usually gets worse slowly over time.  Take steps to reduce the person's risk of injury and to plan for future care.  Caregivers need support, relief from caregiving, and time for their own lives. This information is not intended to replace advice given to you by your health care provider. Make sure you discuss any questions you have with your health care provider. Document Revised: 04/03/2020 Document Reviewed: 04/03/2020 Elsevier Patient Education  [place].

## 2021-04-26 LAB — CMP14+EGFR
ALT: 11 IU/L (ref 0–32)
AST: 14 IU/L (ref 0–40)
Albumin/Globulin Ratio: 1.6 (ref 1.2–2.2)
Albumin: 4.2 g/dL (ref 3.6–4.6)
Alkaline Phosphatase: 110 IU/L (ref 44–121)
BUN/Creatinine Ratio: 19 (ref 12–28)
BUN: 19 mg/dL (ref 8–27)
Bilirubin Total: 0.6 mg/dL (ref 0.0–1.2)
CO2: 28 mmol/L (ref 20–29)
Calcium: 9 mg/dL (ref 8.7–10.3)
Chloride: 100 mmol/L (ref 96–106)
Creatinine, Ser: 0.98 mg/dL (ref 0.57–1.00)
Globulin, Total: 2.7 g/dL (ref 1.5–4.5)
Glucose: 220 mg/dL — ABNORMAL HIGH (ref 65–99)
Potassium: 4.5 mmol/L (ref 3.5–5.2)
Sodium: 142 mmol/L (ref 134–144)
Total Protein: 6.9 g/dL (ref 6.0–8.5)
eGFR: 57 mL/min/{1.73_m2} — ABNORMAL LOW (ref >59)

## 2021-04-26 LAB — CBC WITH DIFFERENTIAL/PLATELET
Basophils Absolute: 0.1 10*3/uL (ref 0.0–0.2)
Basos: 1 %
EOS (ABSOLUTE): 0.1 10*3/uL (ref 0.0–0.4)
Eos: 2 %
Hematocrit: 38.5 % (ref 34.0–46.6)
Hemoglobin: 12.4 g/dL (ref 11.1–15.9)
Immature Grans (Abs): 0 10*3/uL (ref 0.0–0.1)
Immature Granulocytes: 0 %
Lymphocytes Absolute: 1.7 10*3/uL (ref 0.7–3.1)
Lymphs: 29 %
MCH: 29.7 pg (ref 26.6–33.0)
MCHC: 32.2 g/dL (ref 31.5–35.7)
MCV: 92 fL (ref 79–97)
Monocytes Absolute: 0.6 10*3/uL (ref 0.1–0.9)
Monocytes: 10 %
Neutrophils Absolute: 3.5 10*3/uL (ref 1.4–7.0)
Neutrophils: 58 %
Platelets: 183 10*3/uL (ref 150–450)
RBC: 4.18 x10E6/uL (ref 3.77–5.28)
RDW: 13.1 % (ref 11.7–15.4)
WBC: 6 10*3/uL (ref 3.4–10.8)

## 2021-04-26 LAB — T4, FREE: Free T4: 1.31 ng/dL (ref 0.82–1.77)

## 2021-04-26 LAB — TSH: TSH: 2.08 u[IU]/mL (ref 0.450–4.500)

## 2021-06-11 ENCOUNTER — Telehealth: Payer: Self-pay | Admitting: Family Medicine

## 2021-06-11 NOTE — Telephone Encounter (Signed)
Paperwork has been received. 

## 2021-06-11 NOTE — Telephone Encounter (Signed)
Pt's daughter has brought a letter that was placed in Provider bin from [facility] Dept. Of Health and Human Services for eligibility to participate in the CAP/DA waiver. Pt has an appt later in [place].  Pt's daughter asked if she could have a phone call appt w/ provider to discuss this, if needed.  Please advise and thank you.

## 2021-06-28 ENCOUNTER — Encounter: Payer: Self-pay | Admitting: Family Medicine

## 2021-06-28 ENCOUNTER — Telehealth (INDEPENDENT_AMBULATORY_CARE_PROVIDER_SITE_OTHER): Payer: Self-pay

## 2021-06-28 ENCOUNTER — Telehealth: Payer: Self-pay

## 2021-06-28 NOTE — Telephone Encounter (Signed)
Call placed to patient's daughter, Caroline Cooper, regarding concerns about her mother's progressing dementia.  She explained that her mother can't be left alone at all and this is negatively affecting her father's health. They are in the process of safety proofing the house.  She has not been wandering outside of the home and they want to prevent that.   This CM explained possible reason for CAP services denial.  Caroline Cooper stated that she is moving on from that and has been in contact with the [place] about the Aid and Attendance program.  She has also contacted [facility] about providing the in home aides for the program. She said that [facility] works directly with the [place]. She has completed her portion of the application for the Aid and Attendance program and will be dropping off the provider portion for Dr Margarita Rana to complete. She said that Dr Margarita Rana does not have to contact her now but can contact her after reviewing the application if she has questions.    She said that her father has been resistant to having any help in the home but has finally agreed to having an aid for his wife a couple of hours/day. Caroline Cooper said that her mother would not want the PACE program at this time even though it may provide more respite for her father. Caroline Cooper is not interested in a memory care facility for her mother yet.  This CM encouraged her to research/visit/ ask questions about local  memory care facilities so she can be prepared when/if her mother needs more assistance/supervision. She can also speak with the [place] about benefits for memory care for her mother.   Addendum Caroline Cooper dropped off the form for Dr Margarita Rana

## 2021-06-28 NOTE — Telephone Encounter (Signed)
Copied from [place] [id number]. Topic: General - Other >> Jun 28, 2021 11:01 AM Lennox Solders wrote: Reason for CRM: Pt son Caroline Cooper is returning [place] call no answer at office

## 2021-06-28 NOTE — Telephone Encounter (Signed)
Call returned to patient's son, Caroline Cooper.  He was aware that his sister, Caroline Cooper,  has spoken with me today.  Informed him that she dropped off the paperwork for Dr Margarita Rana who now has the documentation to review/complete. He did not have any other questions.  Instructed him to call this CM back if he has questions going forward.

## 2021-06-29 NOTE — Telephone Encounter (Signed)
Patient's daughter called in says she has the info requested from Dr Jetta Lout. Please call back

## 2021-07-02 NOTE — Telephone Encounter (Signed)
Form is ready for pick up.

## 2021-07-02 NOTE — Telephone Encounter (Signed)
Call returned to [facility], message left with call back requested to this CM.

## 2021-07-03 NOTE — Telephone Encounter (Signed)
Caroline Cooper picked up the [place] documentation today

## 2021-07-03 NOTE — Telephone Encounter (Signed)
Call placed to patient's daughter,[name] and informed her that the [place] paperwork is at [facility] ready for pick up. Message left with CM call back number if she has questions.

## 2021-07-23 ENCOUNTER — Encounter: Payer: Self-pay | Admitting: Diagnostic Neuroimaging

## 2021-07-23 ENCOUNTER — Ambulatory Visit (INDEPENDENT_AMBULATORY_CARE_PROVIDER_SITE_OTHER): Payer: Medicare Other | Admitting: Diagnostic Neuroimaging

## 2021-07-23 VITALS — BP 126/82 | HR 62 | Ht 63.0 in | Wt 172.0 lb

## 2021-07-23 DIAGNOSIS — F0391 Unspecified dementia with behavioral disturbance: Secondary | ICD-10-CM

## 2021-07-23 DIAGNOSIS — F03B18 Unspecified dementia, moderate, with other behavioral disturbance: Secondary | ICD-10-CM

## 2021-07-23 NOTE — Patient Instructions (Signed)
MODERATE DEMENTIA WITH BEHAVIORAL CHANGES - consider SSRI for mood changes - continue trazodone for insomnia - follow up with PCP / urology re: frequent urination - safety / supervision issues reviewed - caregiver resources provided - no driving; caution with medications and being alone

## 2021-07-23 NOTE — Progress Notes (Signed)
[facility]  PATIENT: Caroline Cooper DOB: [birth year]  REFERRING CLINICIAN: Charlott Rakes, MD HISTORY FROM: patient, son REASON FOR VISIT: follow up   HISTORICAL  CHIEF COMPLAINT:  Chief Complaint  Patient presents with   Follow-up    Rm 6 with son Here for worsening memory. Reports sx have severely increased since last visit.     HISTORY OF PRESENT ILLNESS:   UPDATE (07/23/21, VRP): Since last visit, doing poorly. More memory decline, hallucinations. Able to eat, use bathroom, bathe and dress. Needs more assistance. Planning to get more help via [place] and other agency. No longer taking some of her meds.  PRIOR HPI (05/24/20): 86 year old female here for evaluation of dementia.  Patient has had gradual onset progressive short-term memory loss, confusion, decline in ADLs since 2016.  Patient lives at home with husband.  He is the primary caregiver.  She tends to repeat herself.  She is becoming more stubborn in terms of following instructions and taking medications.  Patient also having increasing frequency of urination, has tried bladder medication without relief.  Patient tends to wake up several times at night to urinate.  She has seen urology.  Patient also having some confusion and hallucinations.  Also having some mood changes.  Patient was on donepezil in the past but stopped this.  Family is looking into getting additional caregiver support for patient's husband.   REVIEW OF SYSTEMS: Full 14 system review of systems performed and negative with exception of: As per HPI.  ALLERGIES: Allergies  Allergen Reactions   Penicillins Rash     Has patient had a PCN reaction causing immediate rash, facial/tongue/throat swelling, SOB or lightheadedness with hypotension: No Has patient had a PCN reaction causing severe rash involving mucus membranes or skin necrosis: No Has patient had a PCN reaction that required hospitalization No Has patient had a PCN  reaction occurring within the last 10 years: No If all of the above answers are "NO", then may proceed with Cephalosporin use.     HOME MEDICATIONS: Outpatient Medications Prior to Visit  Medication Sig Dispense Refill   acetaminophen (TYLENOL) 500 MG tablet Take 1,000 mg by mouth every 8 (eight) hours as needed for moderate pain or headache.     atorvastatin (LIPITOR) 40 MG tablet TAKE 1 TABLET(40 MG) BY MOUTH DAILY 90 tablet 1   carvedilol (COREG) 25 MG tablet TAKE 1 TABLET(25 MG) BY MOUTH TWICE DAILY WITH A MEAL 180 tablet 1   hydrALAZINE (APRESOLINE) 50 MG tablet Take 1 tablet (50 mg total) by mouth in the morning and at bedtime. 180 tablet 1   memantine (NAMENDA) 10 MG tablet Take orally 10 mg daily at bedtime for 2 weeks then 10 mg twice daily thereafter (Patient not taking: Reported on 07/23/2021) 60 tablet 6   oxybutynin (DITROPAN) 5 MG tablet Take 1 tablet (5 mg total) by mouth 2 (two) times daily. (Patient not taking: No sig reported) 60 tablet 6   traZODone (DESYREL) 50 MG tablet Take 1 tablet (50 mg total) by mouth at bedtime as needed for sleep. For sleep (Patient not taking: No sig reported) 30 tablet 3   No facility-administered medications prior to visit.    PAST MEDICAL HISTORY: Past Medical History:  Diagnosis Date   Diabetes mellitus without complication ([place])    Diverticulitis    DM (diabetes mellitus) ([place]) 09/24/2015   HTN (hypertension) 09/24/2015   Hyperlipidemia 09/24/2015   Hypertension     PAST SURGICAL HISTORY: Past Surgical  History:  Procedure Laterality Date   ABDOMINAL HYSTERECTOMY     CHOLECYSTECTOMY     COLON SURGERY     COLECTOMY FOR DIVERTICULITIS   HERNIA REPAIR      FAMILY HISTORY: Family History  Problem Relation Age of Onset   Hypertension Other     SOCIAL HISTORY: Social History   Socioeconomic History   Marital status: Married    Spouse name: Not on file   Number of children: 4   Years of education: Not on file   Highest  education level: High school graduate  Occupational History    Comment: CNA retired  Tobacco Use   Smoking status: Never   Smokeless tobacco: Never  Vaping Use   Vaping Use: Never used  Substance and Sexual Activity   Alcohol use: No   Drug use: No   Sexual activity: Not on file  Other Topics Concern   Not on file  Social History Narrative   05/24/20 lives with husband   Social Determinants of Health   Financial Resource Strain: Not on file  Food Insecurity: Not on file  Transportation Needs: Not on file  Physical Activity: Not on file  Stress: Not on file  Social Connections: Not on file  Intimate Partner Violence: Not on file     PHYSICAL EXAM  GENERAL EXAM/CONSTITUTIONAL: Vitals:  Vitals:   07/23/21 1559  BP: 126/82  Pulse: 62  SpO2: 96%  Weight: 172 lb (78 kg)  Height: '5\' 3"'$  (1.6 m)   Body mass index is 30.47 kg/m. Wt Readings from Last 3 Encounters:  07/23/21 172 lb (78 kg)  04/25/21 171 lb (77.6 kg)  09/12/20 168 lb 3.2 oz (76.3 kg)   Patient is in no distress; well developed, nourished and groomed; neck is supple  CARDIOVASCULAR: Examination of carotid arteries is normal; no carotid bruits Regular rate and rhythm, no murmurs Examination of peripheral vascular system by observation and palpation is normal  EYES: Ophthalmoscopic exam of optic discs and posterior segments is normal; no papilledema or hemorrhages No results found.  MUSCULOSKELETAL: Gait, strength, tone, movements noted in Neurologic exam below  NEUROLOGIC: MENTAL STATUS:  MMSE - Mini Mental State Exam 05/24/2020  Orientation to time 1  Orientation to Place 1  Registration 3  Attention/ Calculation 1  Recall 0  Language- name 2 objects 2  Language- repeat 0  Language- follow 3 step command 3  Language- read & follow direction 1  Write a sentence 1  Copy design 1  Total score 14   awake, alert, oriented to person [facility] memory  DECR attention and concentration language  fluent, comprehension intact, naming intact fund of knowledge appropriate  CRANIAL NERVE:  2nd - no papilledema on fundoscopic exam 2nd, 3rd, 4th, 6th - pupils equal and reactive to light, visual fields full to confrontation, extraocular muscles intact, no nystagmus 5th - facial sensation symmetric 7th - facial strength symmetric 8th - hearing intact 9th - palate elevates symmetrically, uvula midline 11th - shoulder shrug symmetric 12th - tongue protrusion midline  MOTOR:  normal bulk and tone, full strength in the BUE, BLE  SENSORY:  normal and symmetric to light touch  COORDINATION:  finger-nose-finger, fine finger movements normal  REFLEXES:  deep tendon reflexes present and symmetric  GAIT/STATION:  narrow based gait     DIAGNOSTIC DATA (LABS, IMAGING, TESTING) - I reviewed patient records, labs, notes, testing and imaging myself where available.  Lab Results  Component Value Date   WBC 6.0 04/25/2021  HGB 12.4 04/25/2021   HCT 38.5 04/25/2021   MCV 92 04/25/2021   PLT 183 04/25/2021      Component Value Date/Time   NA 142 04/25/2021 1109   K 4.5 04/25/2021 1109   CL 100 04/25/2021 1109   CO2 28 04/25/2021 1109   GLUCOSE 220 (H) 04/25/2021 1109   GLUCOSE 99 09/19/2019 0236   BUN 19 04/25/2021 1109   CREATININE 0.98 04/25/2021 1109   CREATININE 0.93 10/03/2016 0859   CALCIUM 9.0 04/25/2021 1109   PROT 6.9 04/25/2021 1109   ALBUMIN 4.2 04/25/2021 1109   AST 14 04/25/2021 1109   ALT 11 04/25/2021 1109   ALKPHOS 110 04/25/2021 1109   BILITOT 0.6 04/25/2021 1109   GFRNONAA 54 (L) 09/12/2020 1428   GFRNONAA 59 (L) 10/03/2016 0859   GFRAA 62 09/12/2020 1428   GFRAA 68 10/03/2016 0859   Lab Results  Component Value Date   CHOL 140 09/03/2018   HDL 56 09/03/2018   LDLCALC 60 09/03/2018   TRIG 122 09/03/2018   CHOLHDL 2.5 09/03/2018   Lab Results  Component Value Date   HGBA1C 6.7 (A) 04/25/2021   Lab Results  Component Value Date   VITAMINB12  890 03/16/2017   Lab Results  Component Value Date   TSH 2.080 04/25/2021    11/14/17 CT HEAD [I reviewed images myself and agree with interpretation. -VRP]  1. No evidence of acute intracranial abnormality. 2. Mild-to-moderate chronic small vessel ischemic disease.    ASSESSMENT AND PLAN  86 y.o. year old female here with gradual onset (since 2016) progressive short-term memory loss, confusion, decline in ADLs, hallucinations, mood changes, consistent with moderate neurodegenerative dementia.  Dx:  1. Moderate dementia with behavioral disturbance (HCC)     PLAN:  MODERATE DEMENTIA WITH BEHAVIORAL CHANGES - consider SSRI for mood changes - continue trazodone for insomnia - follow up with PCP / urology re: frequent urination - safety / supervision issues reviewed - caregiver resources provided - no driving; caution with medications and being alone  Return for return to PCP, pending if symptoms worsen or fail to improve.    Penni Bombard, MD [id number], [id number] PM Certified in Neurology, Neurophysiology and Neuroimaging  [facility] [address] [place], [place] [address] [phone]

## 2021-08-20 ENCOUNTER — Encounter: Payer: Self-pay | Admitting: Family Medicine

## 2021-08-21 ENCOUNTER — Telehealth: Payer: Self-pay

## 2021-08-21 NOTE — Telephone Encounter (Signed)
Call placed to patient's daughter, Watt Climes.  She explained how her mother's dementia is progressing and it has become quite difficult for her father to manage her mother's care.  They are still waiting for approval from the [place] for Aide and Attendance Program.  They are trying to accommodate her mother's needs at home as well as support her father while keeping them both at home. They are not interested in facility placement for her mother at this time and Watt Climes does not think that her father would be interested in caregiver support program.   In the meantime, they are exploring the option of private paying for a home health aide through a local agency until the [place] services kick in.  At that time, they will consider paying privately to supplement the [place] services that are in place.

## 2021-08-26 ENCOUNTER — Other Ambulatory Visit: Payer: Self-pay | Admitting: Family Medicine

## 2021-08-26 DIAGNOSIS — I10 Essential (primary) hypertension: Secondary | ICD-10-CM

## 2021-09-25 ENCOUNTER — Telehealth: Payer: Self-pay | Admitting: Family Medicine

## 2021-09-25 NOTE — Telephone Encounter (Signed)
Daughter was called and she states that mom is very confused and she states that mom wondered off last night and father found her up the street.  Daughter is requesting to speak to some one regarding getting her into a nursing home. Callback # [phone] Waylan Boga

## 2021-09-25 NOTE — Telephone Encounter (Signed)
Pt  daughter came in wanting to speak with Dr Margarita Rana or Opal Sidles only about mother didn't give any other information did state it is an emergency

## 2021-09-26 ENCOUNTER — Encounter: Payer: Self-pay | Admitting: Family Medicine

## 2021-09-26 ENCOUNTER — Telehealth: Payer: Self-pay

## 2021-09-26 NOTE — Telephone Encounter (Signed)
Call returned to patient's daughter, Caroline Cooper. She explained how her mother's dementia has progressed and they are now focusing on placement in a memory care facility  to best meet her mother's needs and provide a safe and secure environment.   They have been working with the [facility] and are still waiting for a decision about acceptance into the aides and attendance program.   Caroline Cooper also explained that there is a new [facility] being [facility] in [place] with completion planned by the end of this year.  She said it would be a place for both her mother and father and would have memory support care for her mother.   She said that her brother, [name], received a letter from the [place] requesting medical information from Dr Margarita Rana regarding his mother.  Caroline Cooper has not seen this letter and [name] said that he already sent a copy of the letter to Dr Margarita Rana.   The family's first choice is a [place] facility. The patient does not have Medicaid and private pay for a memory care facility can be very expensive. As a back up plan, Caroline Cooper will plan to contact memory care facilities in [name] to obtain more information about their facilities and inquire if patients have to be approved for Special Assistance Medicaid in order to be eligible for acceptance. This CM provided a brief overview of special assistance medicaid and explained that [name] and [name] are 2 memory care facilities in [name] that accept special assistance Medicaid.  Instructed  [name] to contact this CM with any questions.  She said she understood and would call the agencies tomorrow.

## 2021-09-26 NOTE — Telephone Encounter (Signed)
I spoke with the patient's daughter Royal Hawthorn on the phone and assured her we would do our best to assist in securing nursing placement for her mother.  Case manager Eden Lathe will be in touch with her with an update.

## 2021-09-27 ENCOUNTER — Encounter: Payer: Self-pay | Admitting: Family Medicine

## 2021-09-27 NOTE — Telephone Encounter (Signed)
Spoke to [employer] [facility] # [phone].  She has been working with patient's daughter, Watt Climes, to submit required documentation for the aides and attendance program.  She has a copy of the signed release of information (ROI)  that is needed for this clinic to release medical records to the [place] but she explained that she will need to inquire if she is able to share this ROI request with [place].    Message received from [facility]. She stated that the received approval to send the request for ROI to [facility].  She noted that the records can then be sent to her via email : [email or web address] or fax # [phone].  Email sent to [facility] requesting a copy of the ROI be sent to this CM or Doloris Hall, CMA/

## 2021-09-27 NOTE — Telephone Encounter (Signed)
I have not received any letters so far but I will be reviewing my paperwork for this week today and will be on the look out for it.  If medical records are requested it would have to go through the medical record section but if it is a form for me to complete I am happy to do so.

## 2021-09-28 NOTE — Telephone Encounter (Signed)
Noted  

## 2021-09-28 NOTE — Telephone Encounter (Signed)
Did you receive an email from [place] with the ROI?  I didn't get anything

## 2021-10-02 ENCOUNTER — Ambulatory Visit: Payer: Self-pay | Admitting: *Deleted

## 2021-10-02 ENCOUNTER — Other Ambulatory Visit: Payer: Self-pay

## 2021-10-02 ENCOUNTER — Ambulatory Visit
Admission: EM | Admit: 2021-10-02 | Discharge: 2021-10-02 | Disposition: A | Discharge status: Home / Self Care | Payer: Medicare Other | Attending: Emergency Medicine | Admitting: Emergency Medicine

## 2021-10-02 DIAGNOSIS — S6992XA Unspecified injury of left wrist, hand and finger(s), initial encounter: Secondary | ICD-10-CM | POA: Diagnosis not present

## 2021-10-02 NOTE — ED Provider Notes (Signed)
[facility]    CSN: [id number] Arrival date & time: 10/02/21  1443      History   Chief Complaint Chief Complaint  Patient presents with   Fall    HPI Welcome is a 86 y.o. female.   Patient is here with daughter today, daughter states patient has significant dementia.  Daughter states that patient left the house last night unattended and when her spouse, daughter's father, attempted to bring her back in the house she slipped on the wet grass and fell on her outstretched hand, thinks that the majority of her weight was caught on her left hand.  Today, patient is smiling and interactive and very cooperative on exam, states that her left wrist is very sore, states she is unable to flex or extend her left wrist and the wrist itself is very swollen.  The history is provided by the patient and a relative. No language interpreter was used.   Past Medical History:  Diagnosis Date   Diabetes mellitus without complication ([place])    Diverticulitis    DM (diabetes mellitus) ([place]) 09/24/2015   HTN (hypertension) 09/24/2015   Hyperlipidemia 09/24/2015   Hypertension     Patient Active Problem List   Diagnosis Date Noted   Hyponatremia 09/16/2019   Dementia with behavioral disturbance ([place])    Bacteremia due to Escherichia coli 11/16/2017   Urinary tract infection with hematuria    E. 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   Overactive bladder 11/19/2016   Torus palatinus 11/19/2016   Seasonal allergies 03/14/2016   Need for vaccination for H flu type B with pedvaxHIB 10/03/2015   Neck pain    Type 2 diabetes, HbA1c goal < 7% (HCC)    Elevated troponin 09/24/2015   Neck pain on  right side 09/24/2015   Pain of right upper extremity 09/24/2015   HTN (hypertension) 09/24/2015   Diabetes mellitus without complication ([place]) 99/77/4142   Hyperlipidemia 09/24/2015   Leukocytosis 09/24/2015   Colitis:/ DIAGNOSED 09/18/2015 09/24/2015    Past Surgical History:  Procedure Laterality Date   ABDOMINAL HYSTERECTOMY     CHOLECYSTECTOMY     COLON SURGERY     COLECTOMY FOR DIVERTICULITIS   HERNIA REPAIR      OB History   No obstetric history on file.      Home Medications    Prior to Admission medications   Medication Sig Start Date End Date Taking? Authorizing Provider  acetaminophen (TYLENOL) 500 MG tablet Take 1,000 mg by mouth every 8 (eight) hours as needed for moderate pain or headache.    [provider]  atorvastatin (LIPITOR) 40 MG tablet TAKE 1 TABLET(40 MG) BY MOUTH DAILY 04/25/21   Charlott Rakes, MD  carvedilol (COREG) 25 MG tablet TAKE 1 TABLET(25 MG) BY MOUTH TWICE DAILY WITH A MEAL 04/25/21   Charlott Rakes, MD  hydrALAZINE (APRESOLINE) 50 MG tablet Take 1 tablet (50 mg total) by mouth in the morning and at bedtime. 04/25/21   Charlott Rakes, MD    Family History Family History  Problem Relation Age of Onset   Hypertension Other     Social History Social History   Tobacco Use   Smoking status: Never  Smokeless tobacco: Never  Vaping Use   Vaping Use: Never used  Substance Use Topics   Alcohol use: No   Drug use: No     Allergies   Penicillins   Review of Systems Review of Systems Pertinent findings noted in history of present illness.    Physical Exam Triage Vital Signs ED Triage Vitals  Enc Vitals Group     BP 09/28/21 0827 (!) 147/82     Pulse Rate 09/28/21 0827 72     Resp 09/28/21 0827 18     Temp 09/28/21 0827 98.3 F (36.8 C)     Temp Source 09/28/21 0827 Oral     SpO2 09/28/21 0827 98 %     Weight --      Height --      Head Circumference --      Peak Flow --      Pain Score 09/28/21 0826 5      Pain Loc --      Pain Edu? --      Excl. in [place]? --    No data found.  Updated Vital Signs BP (!) 198/82   Pulse 70   Temp 99.1 F (37.3 C)   Resp 19   SpO2 96%   Visual Acuity Right Eye Distance:   Left Eye Distance:   Bilateral Distance:    Right Eye Near:   Left Eye Near:    Bilateral Near:     Physical Exam Vitals and nursing note reviewed.  Constitutional:      General: She is not in acute distress.    Appearance: Normal appearance. She is not ill-appearing.  HENT:     Head: Normocephalic and atraumatic.  Eyes:     General: Lids are normal.        Right eye: No discharge.        Left eye: No discharge.     Extraocular Movements: Extraocular movements intact.     Conjunctiva/sclera: Conjunctivae normal.     Right eye: Right conjunctiva is not injected.     Left eye: Left conjunctiva is not injected.  Neck:     Trachea: Trachea and phonation normal.  Cardiovascular:     Rate and Rhythm: Normal rate and regular rhythm.     Pulses: Normal pulses.     Heart sounds: Normal heart sounds. No murmur heard.   No friction rub. No gallop.  Pulmonary:     Effort: Pulmonary effort is normal. No accessory muscle usage, prolonged expiration or respiratory distress.     Breath sounds: Normal breath sounds. No stridor, decreased air movement or transmitted upper airway sounds. No decreased breath sounds, wheezing, rhonchi or rales.  Chest:     Chest wall: No tenderness.  Musculoskeletal:        General: Swelling, tenderness, deformity and signs of injury present.     Cervical back: Normal range of motion and neck supple. Normal range of motion.  Lymphadenopathy:     Cervical: No cervical adenopathy.  Skin:    General: Skin is warm and dry.     Findings: No erythema or rash.  Neurological:     General: No focal deficit present.     Mental Status: She is alert and oriented to person, place, and time.  Psychiatric:        Mood and Affect: Mood normal.        Behavior:  Behavior normal.     UC Treatments / Results  Labs (all labs  ordered are listed, but only abnormal results are displayed) Labs Reviewed - No data to display  EKG   Radiology No results found.  Procedures Procedures (including critical care time)  Medications Ordered in UC Medications - No data to display  Initial Impression / Assessment and Plan / UC Course  I have reviewed the triage vital signs and the nursing notes.  Pertinent labs & imaging results that were available during my care of the patient were reviewed by me and considered in my medical decision making (see chart for details).     Patient is a poor historian, based on my physical exam findings however I am concerned that she may have broken a bone in her left wrist.  Patient's daughter advised that while we do have x-ray machine here, it is currently not functioning nor do we have a radiology technician onsite and that I therefore recommend that they go to [place] for x-ray.  Patient was placed in a wrist splint prior to discharge.  Daughter given the names of 2 walk-in orthopedic clinics should x-ray result be positive.  Patient verbalized understanding and agreement of plan as discussed.  All questions were addressed during visit.  Please see discharge instructions below for further details of plan.  Final Clinical Impressions(s) / UC Diagnoses   Final diagnoses:  Left wrist injury, initial encounter     Discharge Instructions      Based on your mother's limited range of motion in her left wrist, I suspect that it is broken.  We have placed her in a wrist splint to immobilize her wrist.  I have ordered an x-ray to be formed at the [place] location, you do not need an appointment, just go to the facility and let them know you have an x-ray ordered, she will not need to be admitted as patient.  Based on the results of the x-ray, you may need to take her to see an orthopedic  specialist, I have provided you with the name of 2 practices that offer walk-in appointments.     ED Prescriptions   None    PDMP not reviewed this encounter.    Lynden Oxford Scales, PA-C 10/02/21 1743

## 2021-10-02 NOTE — Telephone Encounter (Signed)
Correct ROI received from [facility] information from Dr [place]. This has been sent to Cheryln Manly for processing.

## 2021-10-02 NOTE — Telephone Encounter (Signed)
Reason for Disposition  [1] SEVERE pain (e.g., excruciating, unable to use hand or wrist at all) AND [2] not improved after 2 hours of pain medicine  Answer Assessment - Initial Assessment Questions 1. ONSET: "When did the swelling start?" (e.g., minutes, hours, days, weeks)     Watt Climes, daughter calling in.    Her mother has dementia and left the house last night.   My Dad went and found her.   She fell and injured her left wrist.   He helped her up. 2. LOCATION: "What part of the wrist is swollen?"  "Are both wrists swollen or just one wrist?"     It's a little swollen.   If she moves it it hurts a lot.   I gave her Tylenol 4:15 this morning.   I put some Voltaren on her wrist too.  She can't move it at all. 3. SEVERITY: "How bad is the swelling?"    - BALL OR LUMP: small ball or lump   - SKIN ONLY: localized; puffy or swollen area or patch of skin   - MILD JOINT SWELLING: joint feels or looks mildly swollen or puffy   - MODERATE JOINT SWELLING: moderate joint swelling; looks swollen   - SEVERE JOINT SWELLING:  severe joint swelling; can barely bend or move joint     It does not look displaced.   Just swollen.   Between ring finger and pointer finger it's swollen.  4. RECURRENT SYMPTOM: "Have you had wrist swelling before?" If Yes, ask: "When was the last time?" "What happened that time?"     No 5. CAUSE: "What do you think is causing the wrist swelling?" (e.g., arthritis, ganglion cyst, insect bite, recent injury)     She fell during the night.    She doesn't sleep at nights.   She goes to the bathroom frequently.   She has dementia.   6. OTHER SYMPTOMS: "Do you have any other symptoms?" (e.g., fever, hand pain)     She is c/o pain in her toes on right foot from the impact of the fall.   She is having difficulty walking today.  She's afraid of falling.    7. PREGNANCY: "Is there any chance you are pregnant?" "When was your last menstrual period?"     N/A  Protocols used: Wrist  Swelling-A-AH

## 2021-10-02 NOTE — ED Triage Notes (Signed)
Pt presents after slipping on the grass yesterday and falling. Complaints of pain in her left hand and wrist.

## 2021-10-02 NOTE — Telephone Encounter (Signed)
Pt's daughter called in [place].    Her mother has dementia and left the house last night.    Her Dad found the pt outside and she had fallen in the grass.   He helped her up and back into the house.    She is c/o left wrist pain, swelling and not able to move it.   Pt is being short with daughter when Watt Climes tries to ask her questions regarding her wrist during the triage questions.    "She's like that sometimes".  See triage notes.  I have referred pt to the ED/urgent care per the protocol.    Watt Climes is going to take her mother to the urgent care this morning after she finishes with an appt she has with her father.  Watt Climes also asked that I pass a message along to June in [facility] and [facility]ness.   Watt Climes is going to visit [facility] today which is one of the places June suggested she visit for placement of her mother for the dementia.  Watt Climes also asked that I send a message to Dr. Margarita Rana requesting a mild sedative for her mother for night time.   Her father is worn out and needs some rest.   Amyiah is not sleeping at night.   She is up and down to the bathroom multiple times.   "I don't know if she forgets she has gone or what but she is up 9-10 times a night".    "My father is worn out".    "He needs some rest".  I let Watt Climes know I would send her messages.   Watt Climes can be reached at [phone].   I sent this information to [facility] and Wellness high priority.

## 2021-10-02 NOTE — Discharge Instructions (Addendum)
Based on your mother's limited range of motion in her left wrist, I suspect that it is broken.  We have placed her in a wrist splint to immobilize her wrist.  I have ordered an x-ray to be formed at the [place] location, you do not need an appointment, just go to the facility and let them know you have an x-ray ordered, she will not need to be admitted as patient.  Based on the results of the x-ray, you may need to take her to see an orthopedic specialist, I have provided you with the name of 2 practices that offer walk-in appointments.

## 2021-10-03 ENCOUNTER — Ambulatory Visit (HOSPITAL_BASED_OUTPATIENT_CLINIC_OR_DEPARTMENT_OTHER)
Admission: RE | Admit: 2021-10-03 | Discharge: 2021-10-03 | Disposition: A | Discharge status: Home / Self Care | Payer: Medicare Other | Source: Ambulatory Visit | Attending: Emergency Medicine | Admitting: Emergency Medicine

## 2021-10-03 DIAGNOSIS — M7989 Other specified soft tissue disorders: Secondary | ICD-10-CM | POA: Diagnosis not present

## 2021-10-03 DIAGNOSIS — M25532 Pain in left wrist: Secondary | ICD-10-CM | POA: Diagnosis not present

## 2021-10-03 DIAGNOSIS — W19XXXA Unspecified fall, initial encounter: Secondary | ICD-10-CM | POA: Insufficient documentation

## 2021-10-05 ENCOUNTER — Telehealth (INDEPENDENT_AMBULATORY_CARE_PROVIDER_SITE_OTHER): Payer: Self-pay

## 2021-10-05 NOTE — Telephone Encounter (Signed)
Copied from [place] [phone]. Topic: General - Other >> Oct 02, 2021  8:31 AM Leward Quan A wrote: Reason for CRM: Patient daughter Watt Climes called in asking Lowell Guitar to please call her about the nursing home that was discussed Ph# [phone]

## 2021-10-08 NOTE — Telephone Encounter (Signed)
Call returned to patient's daughter, Watt Climes.  She was not sure  why she had called on 10/02/2021.   She then explained that her mother was taken to urgent care after a fall on 10/02/2021 and she remains concerned about her mother's safety at home. The patient wandered out the door from her home and slipped and fell on her left hand when her husband was assisting her back into the home. Per Watt Climes, her mother's wrist is looking better today, the swelling has decreased.   She went to the [facility] today and provided them with the updated information from the urgent care visit. This CM explained that we are working on the request for information for the [place] and we have been in contact with Mercie Eon, [facility], who said that she can accept the information for the [place] once it is ready for submission to the [place].    Watt Climes also explained that she went to DSS today to start the process to file for Medicaid for her mother. She has visited [facility] and has spoken to [facility].  They have a bed open in memory care. Watt Climes said that it was just a quick stop at the facility and she plans to return tomorrow for a tour and to obtain more information.  Explained to her that an FL2 can be done by this office when she is ready to submit it to a facility for review.

## 2021-10-08 NOTE — Telephone Encounter (Signed)
I responded to her on my chart

## 2021-10-09 NOTE — Telephone Encounter (Signed)
noted 

## 2021-10-23 ENCOUNTER — Ambulatory Visit: Payer: Medicare Other | Admitting: Family Medicine

## 2022-01-04 ENCOUNTER — Telehealth: Payer: Self-pay | Admitting: Family Medicine

## 2022-01-04 NOTE — Telephone Encounter (Signed)
They need an updated RX for diabetic testing supplies / request has been faxed to office/ please fax back to [phone]

## 2022-01-09 ENCOUNTER — Other Ambulatory Visit: Payer: Self-pay | Admitting: Family Medicine

## 2022-01-09 DIAGNOSIS — E1169 Type 2 diabetes mellitus with other specified complication: Secondary | ICD-10-CM

## 2022-01-09 DIAGNOSIS — I10 Essential (primary) hypertension: Secondary | ICD-10-CM

## 2022-01-09 DIAGNOSIS — E785 Hyperlipidemia, unspecified: Secondary | ICD-10-CM

## 2022-01-09 DIAGNOSIS — Z20822 Contact with and (suspected) exposure to covid-19: Secondary | ICD-10-CM | POA: Diagnosis not present

## 2022-01-09 NOTE — Telephone Encounter (Signed)
Requested medication (s) are due for refill today: yes PATIENT HAS NEWER RX WHICH ALSO SHOULD BE NEEDING REFILL  Requested medication (s) are on the active medication list: yes  Last refill:  04/25/21 Lipitor #90 with 1 RF, Carvedilol #180 with 1 RF THESE ARE NEWER REFILL THAN REQUEST IS FOR.  Future visit scheduled: 01/22/22  Notes to clinic:  Lipid labs are from 2019 therefore failed protocol for Lipitor, has visit coming up for Carvedilol. Please assess.  Requested Prescriptions  Pending Prescriptions Disp Refills   atorvastatin (LIPITOR) 40 MG tablet [Pharmacy Med Name: ATORVASTATIN 40MG  TABLETS] 90 tablet 1    Sig: TAKE 1 TABLET(40 MG) BY MOUTH DAILY     Cardiovascular:  Antilipid - Statins Failed - 01/09/2022  2:25 PM      Failed - Lipid Panel in normal range within the last 12 months    Cholesterol, Total  Date Value Ref Range Status  09/03/2018 140 100 - 199 mg/dL Final   LDL Calculated  Date Value Ref Range Status  09/03/2018 60 0 - 99 mg/dL Final   HDL  Date Value Ref Range Status  09/03/2018 56 >39 mg/dL Final   Triglycerides  Date Value Ref Range Status  09/03/2018 122 0 - 149 mg/dL Final         Passed - Patient is not pregnant      Passed - Valid encounter within last 12 months    Recent Outpatient Visits           8 months ago Type 2 diabetes mellitus with other specified complication, without long-term current use of insulin ([place])   [place], Charlane Ferretti, MD   1 year ago Dementia with behavioral disturbance, unspecified dementia type ([place])   [place], Goodman, MD   1 year ago Type 2 diabetes mellitus with other specified complication, without long-term current use of insulin ([place])   [place], [place], MD   1 year ago Type 2 diabetes mellitus with other specified complication, without long-term current use of insulin ([place])   [facility] [place], Charlane Ferretti, MD   2 years ago Pure hypercholesterolemia   [place], [place], MD       Future Appointments             In 1 week Charlott Rakes, MD [place]             carvedilol (COREG) 25 MG tablet [Pharmacy Med Name: CARVEDILOL 25MG  TABLETS] 180 tablet 1    Sig: TAKE 1 TABLET(25 MG) BY MOUTH TWICE DAILY WITH A MEAL     Cardiovascular: Beta Blockers 3 Failed - 01/09/2022  2:25 PM      Failed - Last BP in normal range    BP Readings from Last 1 Encounters:  10/02/21 (!) 198/82          Failed - Valid encounter within last 6 months    Recent Outpatient Visits           8 months ago Type 2 diabetes mellitus with other specified complication, without long-term current use of insulin ([place])   [place], Charlane Ferretti, MD   1 year ago Dementia with behavioral disturbance, unspecified dementia type [facility])   [place], Enobong, MD   1 year ago Type 2 diabetes  mellitus with other specified complication, without long-term current use of insulin ([place])   [place], [place], MD   1 year ago Type 2 diabetes mellitus with other specified complication, without long-term current use of insulin ([place])   [place] [facility] Charlott Rakes, MD   2 years ago Pure hypercholesterolemia   [place], Enobong, MD       Future Appointments             In 1 week Charlott Rakes, MD [place] in normal range and within 360 days    Creat  Date Value Ref Range Status  10/03/2016 0.93 0.60 - 0.93 mg/dL Final    Comment:      For patients > or = 85 years of age: The upper reference limit for Creatinine is approximately 13% higher for people identified  as African-American.      Creatinine, Ser  Date Value Ref Range Status  04/25/2021 0.98 0.57 - 1.00 mg/dL Final   Creatinine, Urine  Date Value Ref Range Status  10/03/2016 198 20 - 320 mg/dL Final          Passed - AST in normal range and within 360 days    AST  Date Value Ref Range Status  04/25/2021 14 0 - 40 IU/L Final          Passed - ALT in normal range and within 360 days    ALT  Date Value Ref Range Status  04/25/2021 11 0 - 32 IU/L Final          Passed - Last Heart Rate in normal range    Pulse Readings from Last 1 Encounters:  10/02/21 70

## 2022-01-10 ENCOUNTER — Other Ambulatory Visit: Payer: Self-pay | Admitting: Family Medicine

## 2022-01-10 DIAGNOSIS — E1169 Type 2 diabetes mellitus with other specified complication: Secondary | ICD-10-CM

## 2022-01-10 DIAGNOSIS — I10 Essential (primary) hypertension: Secondary | ICD-10-CM

## 2022-01-10 NOTE — Telephone Encounter (Signed)
Form has been received and paperwork will be placed in PCP folder for signature.

## 2022-01-11 NOTE — Telephone Encounter (Signed)
Form has been faxed over today to company.

## 2022-01-11 NOTE — Telephone Encounter (Signed)
Camia called from [place] to check status of request, please advise.

## 2022-01-22 ENCOUNTER — Ambulatory Visit: Payer: Medicare Other | Admitting: Family Medicine

## 2022-02-06 ENCOUNTER — Other Ambulatory Visit: Payer: Self-pay | Admitting: Family Medicine

## 2022-02-06 DIAGNOSIS — I10 Essential (primary) hypertension: Secondary | ICD-10-CM

## 2022-02-06 DIAGNOSIS — E1169 Type 2 diabetes mellitus with other specified complication: Secondary | ICD-10-CM

## 2022-02-06 DIAGNOSIS — E785 Hyperlipidemia, unspecified: Secondary | ICD-10-CM

## 2022-02-15 ENCOUNTER — Ambulatory Visit: Payer: Medicare Other

## 2022-02-25 ENCOUNTER — Other Ambulatory Visit: Payer: Self-pay | Admitting: Family Medicine

## 2022-02-25 DIAGNOSIS — I10 Essential (primary) hypertension: Secondary | ICD-10-CM

## 2022-02-27 ENCOUNTER — Other Ambulatory Visit: Payer: Self-pay | Admitting: Family Medicine

## 2022-02-27 DIAGNOSIS — I10 Essential (primary) hypertension: Secondary | ICD-10-CM

## 2022-03-07 ENCOUNTER — Other Ambulatory Visit: Payer: Self-pay | Admitting: Family Medicine

## 2022-03-07 DIAGNOSIS — I10 Essential (primary) hypertension: Secondary | ICD-10-CM

## 2022-03-07 DIAGNOSIS — E785 Hyperlipidemia, unspecified: Secondary | ICD-10-CM

## 2022-03-07 NOTE — Telephone Encounter (Signed)
Copied from [place] [phone] [phone]. Topic: Quick Communication - Rx Refill/Question ?>> Mar 07, 2022 11:59 AM Tessa Lerner A wrote: ?Medication: carvedilol (COREG) 25 MG tablet [[id number]] - patient has 0 remaining  ? ?atorvastatin (LIPITOR) 40 MG tablet [[id number] - patient has 0 remaining  ? ?hydrALAZINE (APRESOLINE) 50 MG tablet [[id number]] - patient has 0 remaining  ? ?Has the patient contacted their pharmacy? Yes.   ?(Agent: If no, request that the patient contact the pharmacy for the refill. If patient does not wish to contact the pharmacy document the reason why and proceed with request.) ?(Agent: If yes, when and what did the pharmacy advise?) ? ?Preferred Pharmacy (with phone number or street name): [id number] [address] ?[place] [address] ?Phone: [phone] Fax: [phone] ?Hours: Not open 24 hours ? ?Has the patient been seen for an appointment in the last year OR does the patient have an upcoming appointment? Yes.   ? ?Agent: Please be advised that RX refills may take up to 3 business days. We ask that you follow-up with your pharmacy. ?

## 2022-03-08 MED ORDER — HYDRALAZINE HCL 50 MG PO TABS: 50.0000 mg | ORAL_TABLET | Freq: Two times a day (BID) | ORAL | 0 refills | Status: DC

## 2022-03-08 MED ORDER — ATORVASTATIN CALCIUM 40 MG PO TABS: ORAL_TABLET | ORAL | 0 refills | Status: DC

## 2022-03-08 NOTE — Telephone Encounter (Signed)
Refills extended until appointment- Carvedilol should last until appointment-filled 02/27/22 #60 ?Requested Prescriptions  ?Pending Prescriptions Disp Refills  ?? atorvastatin (LIPITOR) 40 MG tablet 18 tablet 0  ?  ? Cardiovascular:  Antilipid - Statins Failed - 03/07/2022  1:31 PM  ?  ?  Failed - Lipid Panel in normal range within the last 12 months  ?  Cholesterol, Total  ?Date Value Ref Range Status  ?09/03/2018 140 100 - 199 mg/dL Final  ? ?LDL Calculated  ?Date Value Ref Range Status  ?09/03/2018 60 0 - 99 mg/dL Final  ? ?HDL  ?Date Value Ref Range Status  ?09/03/2018 56 >39 mg/dL Final  ? ?Triglycerides  ?Date Value Ref Range Status  ?09/03/2018 122 0 - 149 mg/dL Final  ? ?  ?  ?  Passed - Patient is not pregnant  ?  ?  Passed - Valid encounter within last 12 months  ?  Recent Outpatient Visits   ?      ? 10 months ago Type 2 diabetes mellitus with other specified complication, without long-term current use of insulin ([place])  ? [place], Charlane Ferretti, MD  ? 1 year ago Dementia with behavioral disturbance, unspecified dementia type [facility])  ? [place], Charlane Ferretti, MD  ? 1 year ago Type 2 diabetes mellitus with other specified complication, without long-term current use of insulin ([place])  ? [place], Charlane Ferretti, MD  ? 1 year ago Type 2 diabetes mellitus with other specified complication, without long-term current use of insulin ([place])  ? [place], MD  ? 2 years ago Pure hypercholesterolemia  ? [place] Charlott Rakes, MD  ?  ?  ?Future Appointments   ?        ? In 2 weeks Charlott Rakes, MD [place]  ?  ? ?  ?  ?  ?? carvedilol (COREG) 25 MG tablet 60 tablet 0  ?  ? Cardiovascular: Beta Blockers 3 Failed - 03/07/2022  1:31 PM  ?  ?  Failed - Last BP in normal range  ?  BP Readings from Last 1  Encounters:  ?10/02/21 (!) 198/82  ?   ?  ?  Failed - Valid encounter within last 6 months  ?  Recent Outpatient Visits   ?      ? 10 months ago Type 2 diabetes mellitus with other specified complication, without long-term current use of insulin ([place])  ? [place], Charlane Ferretti, MD  ? 1 year ago Dementia with behavioral disturbance, unspecified dementia type [facility])  ? [place], Charlane Ferretti, MD  ? 1 year ago Type 2 diabetes mellitus with other specified complication, without long-term current use of insulin ([place])  ? [place], Charlane Ferretti, MD  ? 1 year ago Type 2 diabetes mellitus with other specified complication, without long-term current use of insulin ([place])  ? [place], MD  ? 2 years ago Pure hypercholesterolemia  ? [place] Charlott Rakes, MD  ?  ?  ?Future Appointments   ?        ? In 2 weeks Charlott Rakes, MD [place]  ?  ? ?  ?  ?  Passed - Cr  in normal range and within 360 days  ?  Creat  ?Date Value Ref Range Status  ?10/03/2016 0.93 0.60 - 0.93 mg/dL Final  ?  Comment:  ?    ?For patients > or = 85 years of age: The upper reference limit for ?Creatinine is approximately 13% higher for people identified as ?African-American. ?  ?  ? ?Creatinine, Ser  ?Date Value Ref Range Status  ?04/25/2021 0.98 0.57 - 1.00 mg/dL Final  ? ?Creatinine, Urine  ?Date Value Ref Range Status  ?10/03/2016 198 20 - 320 mg/dL Final  ?   ?  ?  Passed - AST in normal range and within 360 days  ?  AST  ?Date Value Ref Range Status  ?04/25/2021 14 0 - 40 IU/L Final  ?   ?  ?  Passed - ALT in normal range and within 360 days  ?  ALT  ?Date Value Ref Range Status  ?04/25/2021 11 0 - 32 IU/L Final  ?   ?  ?  Passed - Last Heart Rate in normal range  ?  Pulse Readings from Last 1 Encounters:  ?10/02/21 70  ?   ?  ?  ??  hydrALAZINE (APRESOLINE) 50 MG tablet 36 tablet 0  ?  Sig: Take 1 tablet (50 mg total) by mouth in the morning and at bedtime.  ?  ? Cardiovascular:  Vasodilators Failed - 03/07/2022  1:31 PM  ?  ?  Failed - Kattia Screen, Ifa, Serum in normal range and within 360 days  ?  No results found for: Dorla, ANATITER, LABANTI   ?  ?  Failed - Last BP in normal range  ?  BP Readings from Last 1 Encounters:  ?10/02/21 (!) 198/82  ?   ?  ?  Passed - HCT in normal range and within 360 days  ?  Hematocrit  ?Date Value Ref Range Status  ?04/25/2021 38.5 34.0 - 46.6 % Final  ?   ?  ?  Passed - HGB in normal range and within 360 days  ?  Hemoglobin  ?Date Value Ref Range Status  ?04/25/2021 12.4 11.1 - 15.9 g/dL Final  ?   ?  ?  Passed - RBC in normal range and within 360 days  ?  RBC  ?Date Value Ref Range Status  ?04/25/2021 4.18 3.77 - 5.28 x10E6/uL Final  ?09/17/2019 3.96 3.87 - 5.11 MIL/uL Final  ?   ?  ?  Passed - WBC in normal range and within 360 days  ?  WBC  ?Date Value Ref Range Status  ?04/25/2021 6.0 3.4 - 10.8 x10E3/uL Final  ?09/17/2019 8.9 4.0 - 10.5 K/uL Final  ?   ?  ?  Passed - PLT in normal range and within 360 days  ?  Platelets  ?Date Value Ref Range Status  ?04/25/2021 183 150 - 450 x10E3/uL Final  ?   ?  ?  Passed - Valid encounter within last 12 months  ?  Recent Outpatient Visits   ?      ? 10 months ago Type 2 diabetes mellitus with other specified complication, without long-term current use of insulin ([place])  ? [place], Charlane Ferretti, MD  ? 1 year ago Dementia with behavioral disturbance, unspecified dementia type [facility])  ? [place], Charlane Ferretti, MD  ? 1 year ago Type 2 diabetes mellitus with other specified complication, without long-term current use of insulin ([place])  ?  [place], Charlane Ferretti, MD  ? 1 year ago Type 2 diabetes mellitus with other specified complication, without long-term current use of  insulin ([place])  ? [place], MD  ? 2 years ago Pure hypercholesterolemia  ? [place] Charlott Rakes, MD  ?  ?  ?Future Appointments   ?        ? In 2 weeks Charlott Rakes, MD [place]  ?  ? ?  ?  ?  ? ?

## 2022-03-23 IMAGING — DX DG WRIST COMPLETE 3+V*L*
4 series · 4 of 4 positions shown · non-contrast
Comparison: None.

CLINICAL DATA: Fall on outstretched hand, left wrist pain and
swelling

EXAM:
LEFT WRIST - COMPLETE 3+ VIEW

[wrist pa]
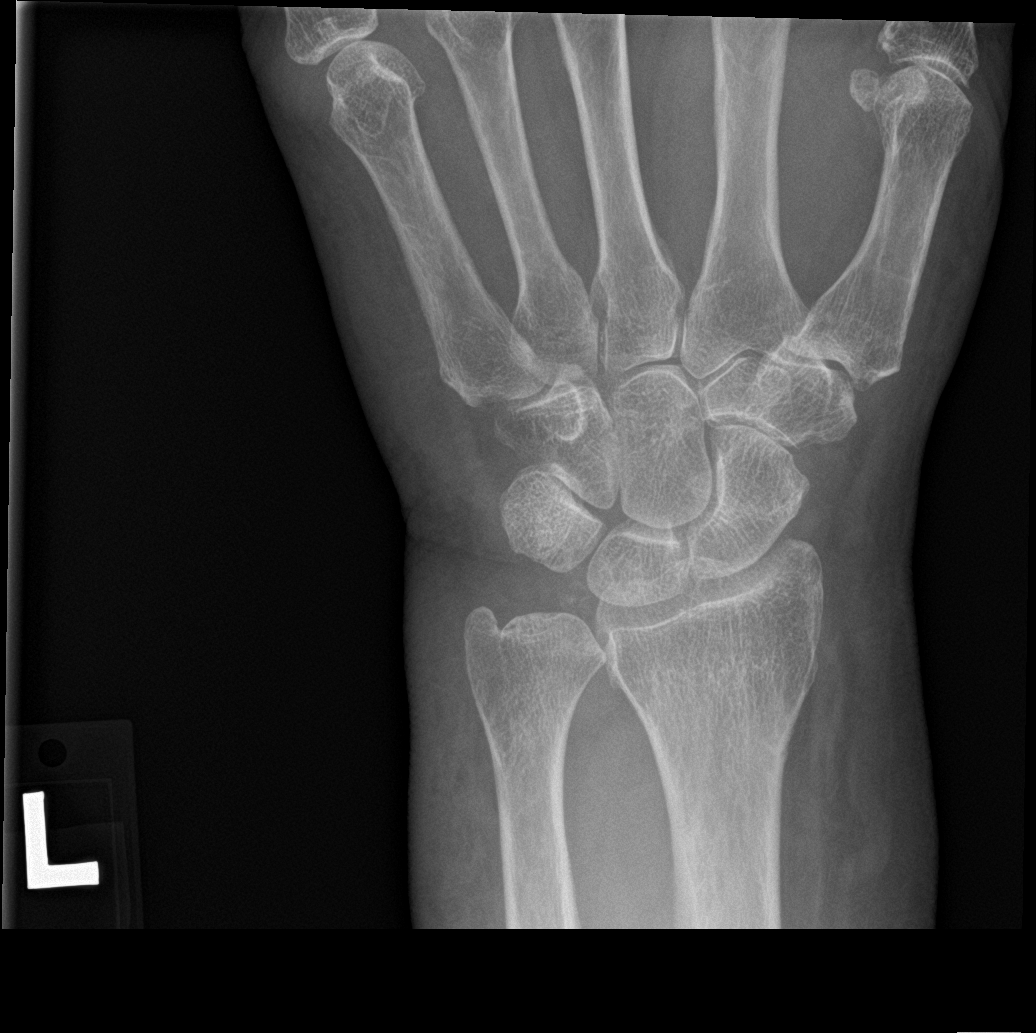

[wrist obl]
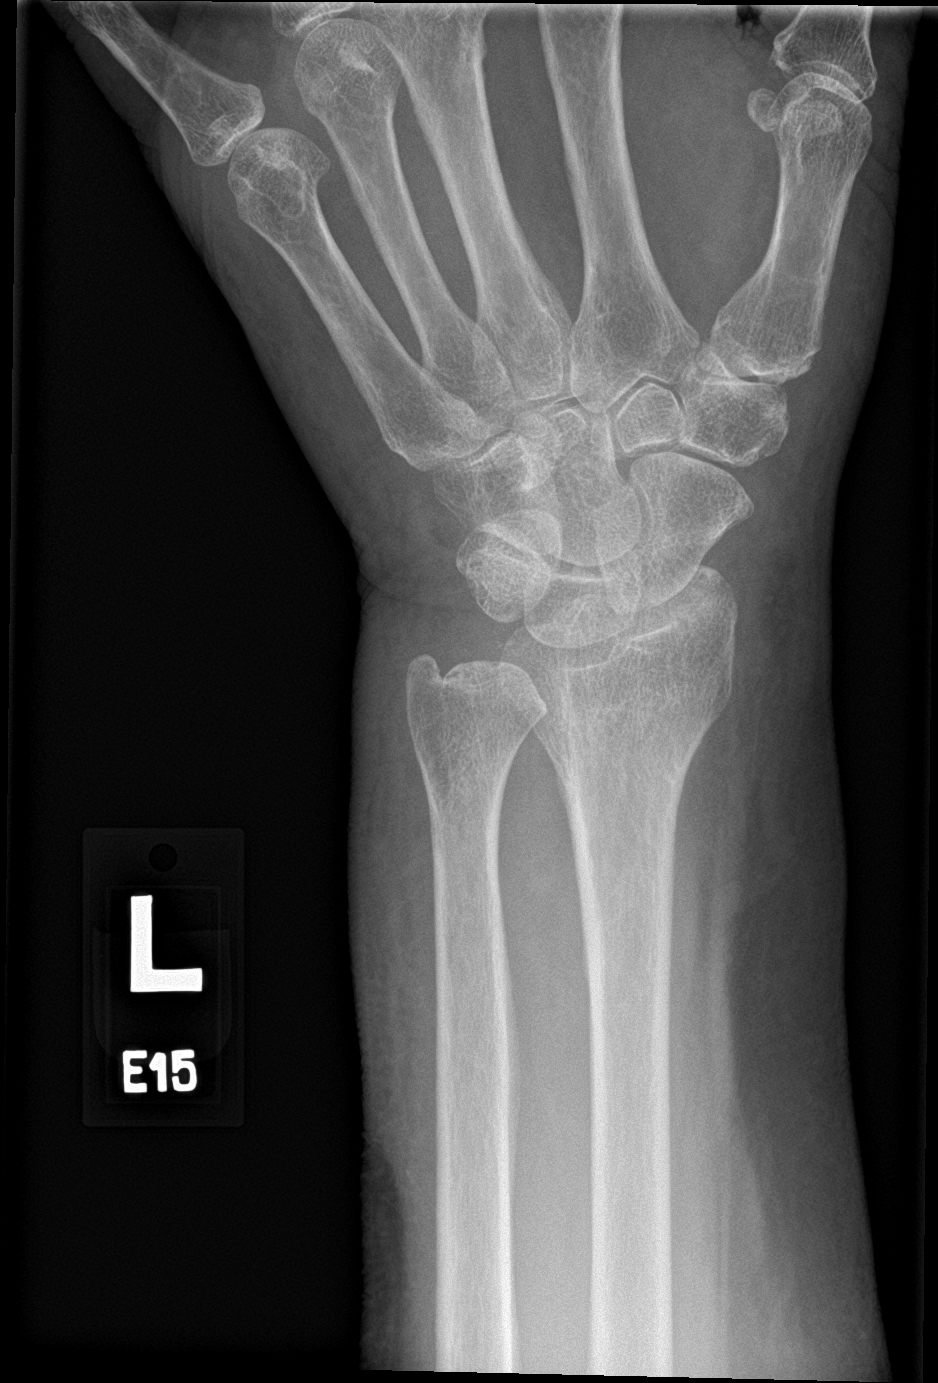

[wrist lat]
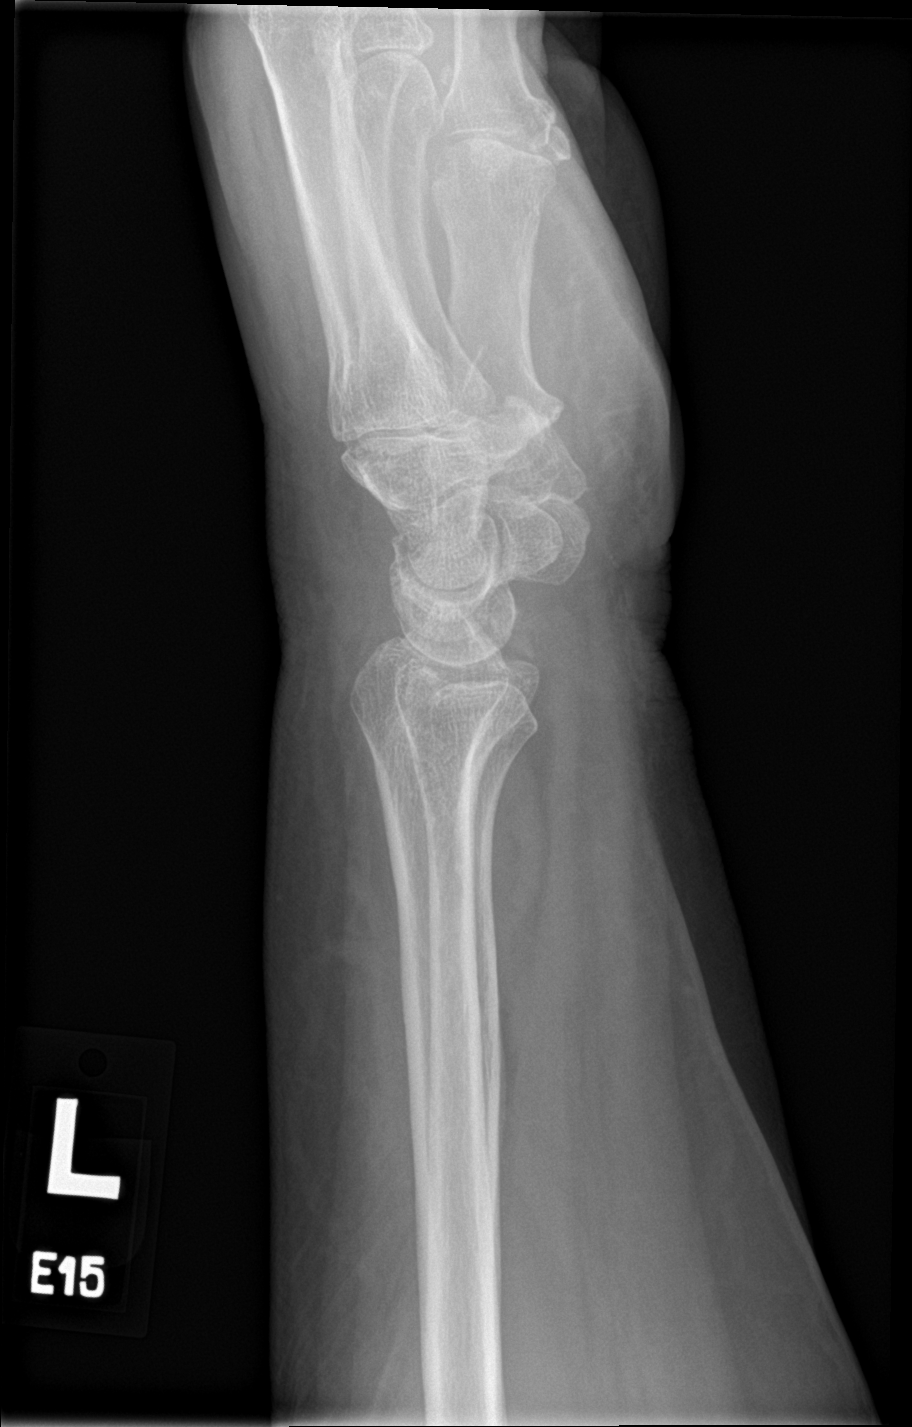

[wrist navicular]
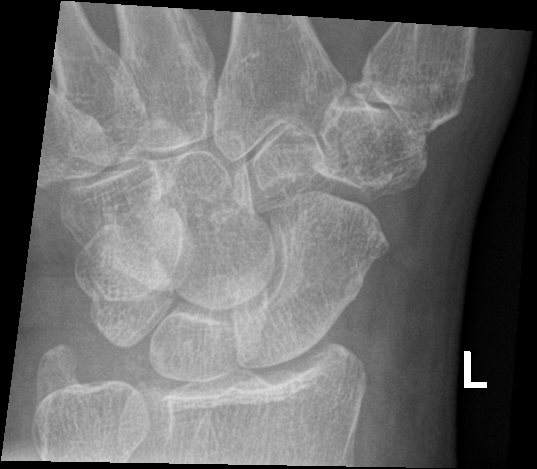

[4 of 4 positions shown; findings below may reference images not displayed]

FINDINGS: Normal alignment. No acute fracture or dislocation. Mild
degenerative arthritis of the first CMC joint at the base of the
thumb. There is diffuse soft tissue swelling surrounding the carpus.
IMPRESSION: Soft tissue swelling.  No acute fracture or dislocation.

## 2022-03-25 DIAGNOSIS — Z20822 Contact with and (suspected) exposure to covid-19: Secondary | ICD-10-CM | POA: Diagnosis not present

## 2022-03-26 ENCOUNTER — Telehealth: Payer: Self-pay

## 2022-03-26 ENCOUNTER — Encounter: Payer: Self-pay | Admitting: Family Medicine

## 2022-03-26 ENCOUNTER — Ambulatory Visit: Payer: Medicare Other | Attending: Family Medicine | Admitting: Family Medicine

## 2022-03-26 VITALS — BP 130/69 | HR 62 | Ht 64.0 in | Wt 172.2 lb

## 2022-03-26 DIAGNOSIS — F02B18 Dementia in other diseases classified elsewhere, moderate, with other behavioral disturbance: Secondary | ICD-10-CM

## 2022-03-26 DIAGNOSIS — E1169 Type 2 diabetes mellitus with other specified complication: Secondary | ICD-10-CM | POA: Diagnosis not present

## 2022-03-26 DIAGNOSIS — G309 Alzheimer's disease, unspecified: Secondary | ICD-10-CM | POA: Diagnosis not present

## 2022-03-26 DIAGNOSIS — N3281 Overactive bladder: Secondary | ICD-10-CM

## 2022-03-26 DIAGNOSIS — E785 Hyperlipidemia, unspecified: Secondary | ICD-10-CM | POA: Diagnosis not present

## 2022-03-26 DIAGNOSIS — I10 Essential (primary) hypertension: Secondary | ICD-10-CM | POA: Diagnosis not present

## 2022-03-26 DIAGNOSIS — R269 Unspecified abnormalities of gait and mobility: Secondary | ICD-10-CM

## 2022-03-26 DIAGNOSIS — F02B0 Dementia in other diseases classified elsewhere, moderate, without behavioral disturbance, psychotic disturbance, mood disturbance, and anxiety: Secondary | ICD-10-CM | POA: Insufficient documentation

## 2022-03-26 LAB — POCT URINALYSIS DIP (CLINITEK)
Bilirubin, UA: NEGATIVE
Blood, UA: NEGATIVE
Glucose, UA: NEGATIVE mg/dL
Ketones, POC UA: NEGATIVE mg/dL
Leukocytes, UA: NEGATIVE
Nitrite, UA: NEGATIVE
POC PROTEIN,UA: NEGATIVE
Spec Grav, UA: 1.025 (ref 1.010–1.025)
Urobilinogen, UA: 1 E.U./dL
pH, UA: 6 (ref 5.0–8.0)

## 2022-03-26 LAB — POCT GLYCOSYLATED HEMOGLOBIN (HGB A1C): HbA1c, POC (controlled diabetic range): 6.6 % (ref 0.0–7.0)

## 2022-03-26 MED ORDER — ATORVASTATIN CALCIUM 40 MG PO TABS: ORAL_TABLET | ORAL | 1 refills | Status: AC

## 2022-03-26 MED ORDER — CARVEDILOL 25 MG PO TABS: 25.0000 mg | ORAL_TABLET | Freq: Two times a day (BID) | ORAL | 1 refills | Status: DC

## 2022-03-26 MED ORDER — HYDRALAZINE HCL 50 MG PO TABS: 50.0000 mg | ORAL_TABLET | Freq: Two times a day (BID) | ORAL | 1 refills | Status: AC

## 2022-03-26 NOTE — Telephone Encounter (Signed)
Referral received for home PT. I spoke to patient's son, Caroline Cooper and he has no preference for home health agencies.   ?Referral then faxed to [facility] for review.  ? ? ?Per Caroline Cooper, his mother is only receiving limited hours from the [place] through the [facility] and Attendance Program and they are paying privately for the additional hours.  ?

## 2022-03-26 NOTE — Addendum Note (Signed)
Addended by: Charlott Rakes on: 03/26/2022 03:32 PM ? ? Modules accepted: Orders ? ?

## 2022-03-26 NOTE — Patient Instructions (Signed)
Exercising to Stay Healthy To become healthy and stay healthy, it is recommended that you do moderate-intensity and vigorous-intensity exercise. You can tell that you are exercising at a moderate intensity if your heart starts beating faster and you start breathing faster but can still hold a conversation. You can tell that you are exercising at a vigorous intensity if you are breathing much harder and faster and cannot hold a conversation while exercising. How can exercise benefit me? Exercising regularly is important. It has many health benefits, such as: Improving overall fitness, flexibility, and endurance. Increasing bone density. Helping with weight control. Decreasing body fat. Increasing muscle strength and endurance. Reducing stress and tension, anxiety, depression, or anger. Improving overall health. What guidelines should I follow while exercising? Before you start a new exercise program, talk with your health care provider. Do not exercise so much that you hurt yourself, feel dizzy, or get very short of breath. Wear comfortable clothes and wear shoes with good support. Drink plenty of water while you exercise to prevent dehydration or heat stroke. Work out until your breathing and your heartbeat get faster (moderate intensity). How often should I exercise? Choose an activity that you enjoy, and set realistic goals. Your health care provider can help you make an activity plan that is individually designed and works best for you. Exercise regularly as told by your health care provider. This may include: Doing strength training two times a week, such as: Lifting weights. Using resistance bands. Push-ups. Sit-ups. Yoga. Doing a certain intensity of exercise for a given amount of time. Choose from these options: A total of 150 minutes of moderate-intensity exercise every week. A total of 75 minutes of vigorous-intensity exercise every week. A mix of moderate-intensity and  vigorous-intensity exercise every week. Children, pregnant women, people who have not exercised regularly, people who are overweight, and older adults may need to talk with a health care provider about what activities are safe to perform. If you have a medical condition, be sure to talk with your health care provider before you start a new exercise program. What are some exercise ideas? Moderate-intensity exercise ideas include: Walking 1 mile (1.6 km) in about 15 minutes. Biking. Hiking. Golfing. Dancing. Water aerobics. Vigorous-intensity exercise ideas include: Walking 4.5 miles (7.2 km) or more in about 1 hour. Jogging or running 5 miles (8 km) in about 1 hour. Biking 10 miles (16.1 km) or more in about 1 hour. Lap swimming. Roller-skating or in-line skating. Cross-country skiing. Vigorous competitive sports, such as football, basketball, and soccer. Jumping rope. Aerobic dancing. What are some everyday activities that can help me get exercise? Yard work, such as: Pushing a lawn mower. Raking and bagging leaves. Washing your car. Pushing a stroller. Shoveling snow. Gardening. Washing windows or floors. How can I be more active in my day-to-day activities? Use stairs instead of an elevator. Take a walk during your lunch break. If you drive, park your car farther away from your work or school. If you take public transportation, get off one stop early and walk the rest of the way. Stand up or walk around during all of your indoor phone calls. Get up, stretch, and walk around every 30 minutes throughout the day. Enjoy exercise with a friend. Support to continue exercising will help you keep a regular routine of activity. Where to find more information You can find more information about exercising to stay healthy from: U.S. Department of Health and Human Services: www.hhs.gov Centers for Disease Control and Prevention (  CDC): www.cdc.gov Summary Exercising regularly is  important. It will improve your overall fitness, flexibility, and endurance. Regular exercise will also improve your overall health. It can help you control your weight, reduce stress, and improve your bone density. Do not exercise so much that you hurt yourself, feel dizzy, or get very short of breath. Before you start a new exercise program, talk with your health care provider. This information is not intended to replace advice given to you by your health care provider. Make sure you discuss any questions you have with your health care provider. Document Revised: 03/16/2021 Document Reviewed: 03/16/2021 Elsevier Patient Education  2023 Elsevier Inc.  

## 2022-03-26 NOTE — Progress Notes (Signed)
? ?Subjective:  ?Patient ID: Caroline Cooper, female    DOB: 04-30-1935  Age: 87 y.o. MRN: [id number] ? ?CC: Diabetes ? ? ?HPI ?[place] is a 87 y.o. year old female with a history of hypertension, type 2 diabetes mellitus (diet controlled with A1c 6.6), depression, Dementia, overactive bladder who is seen today for a follow-up visit  ?Accompanied by Jeneen Montgomery to today's visit. ? ?Interval History: ? ?Son states her memory has today deteriorated, she has not had any recent wandering episodes but did 1.on 2 occasions a while ago.  Son states she says she has to go to the bathroom to urinate all the time and forgets she has just recently use the bathroom. ?She continues to have accidents on herself but when advised to use depends a pull-up she declines.  She was previously on oxybutynin which she declined using. ? ?The family got [facility] as a private service who come 3 x/week to assist the patient and her spouse.  Attempts at obtaining PCS services to the [place] has taken a prolonged course. ?She endorses compliance with her antihypertensive and statin. ?Diabetes is diet controlled. ? ?Patient informs me that she is active and walks a lot but her son denies this and states she is really sedentary. ?She has had a previous history of UTI and daughter called on the phone and would like to have her UA checked for UTI. ?Past Medical History:  ?Diagnosis Date  ? Diabetes mellitus without complication ([place])   ? Diverticulitis   ? DM (diabetes mellitus) ([place]) 09/24/2015  ? HTN (hypertension) 09/24/2015  ? Hyperlipidemia 09/24/2015  ? Hypertension   ? ? ?Past Surgical History:  ?Procedure Laterality Date  ? ABDOMINAL HYSTERECTOMY    ? CHOLECYSTECTOMY    ? COLON SURGERY    ? COLECTOMY FOR DIVERTICULITIS  ? HERNIA REPAIR    ? ? ?Family History  ?Problem Relation Age of Onset  ? Hypertension Other   ? ? ?Social History  ? ?Socioeconomic History  ? Marital status: Married  ?  Spouse name: Not on file   ? Number of children: 4  ? Years of education: Not on file  ? Highest education level: High school graduate  ?Occupational History  ?  Comment: CNA retired  ?Tobacco Use  ? Smoking status: Never  ? Smokeless tobacco: Never  ?Vaping Use  ? Vaping Use: Never used  ?Substance and Sexual Activity  ? Alcohol use: No  ? Drug use: No  ? Sexual activity: Not on file  ?Other Topics Concern  ? Not on file  ?Social History Narrative  ? 05/24/20 lives with husband  ? ?Social Determinants of Health  ? ?Financial Resource Strain: Not on file  ?Food Insecurity: Not on file  ?Transportation Needs: Not on file  ?Physical Activity: Not on file  ?Stress: Not on file  ?Social Connections: Not on file  ? ? ?Allergies  ?Allergen Reactions  ? Penicillins Rash  ?   ?Has patient had a PCN reaction causing immediate rash, facial/tongue/throat swelling, SOB or lightheadedness with hypotension: No ?Has patient had a PCN reaction causing severe rash involving mucus membranes or skin necrosis: No ?Has patient had a PCN reaction that required hospitalization No ?Has patient had a PCN reaction occurring within the last 10 years: No ?If all of the above answers are "NO", then may proceed with Cephalosporin use. ?  ? ? ?Outpatient Medications Prior to Visit  ?Medication Sig Dispense Refill  ? acetaminophen (  TYLENOL) 500 MG tablet Take 1,000 mg by mouth every 8 (eight) hours as needed for moderate pain or headache.    ? atorvastatin (LIPITOR) 40 MG tablet 1 tablet daily 18 tablet 0  ? carvedilol (COREG) 25 MG tablet TAKE 1 TABLET(25 MG) BY MOUTH TWICE DAILY WITH A MEAL 60 tablet 0  ? hydrALAZINE (APRESOLINE) 50 MG tablet Take 1 tablet (50 mg total) by mouth in the morning and at bedtime. 36 tablet 0  ? ?No facility-administered medications prior to visit.  ? ? ? ?ROS ?Review of Systems  ?Constitutional:  Negative for activity change, appetite change and fatigue.  ?HENT:  Negative for congestion, sinus pressure and sore throat.   ?Eyes:  Negative for  visual disturbance.  ?Respiratory:  Negative for cough, chest tightness, shortness of breath and wheezing.   ?Cardiovascular:  Negative for chest pain and palpitations.  ?Gastrointestinal:  Negative for abdominal distention, abdominal pain and constipation.  ?Endocrine: Negative for polydipsia.  ?Genitourinary:  Negative for dysuria and frequency.  ?Musculoskeletal:  Negative for arthralgias and back pain.  ?Skin:  Negative for rash.  ?Neurological:  Negative for tremors, light-headedness and numbness.  ?Hematological:  Does not bruise/bleed easily.  ?Psychiatric/Behavioral:  Negative for agitation and behavioral problems.   ? ?Objective:  ?BP 130/69   Pulse 62   Ht '5\' 4"'$  (1.626 m)   Wt 172 lb 3.2 oz (78.1 kg)   SpO2 98%   BMI 29.56 kg/m?  ? ? ?  03/26/2022  ?  2:32 PM 10/02/2021  ?  4:02 PM 07/23/2021  ?  3:59 PM  ?BP/Weight  ?Systolic BP 124 580 998  ?Diastolic BP 69 82 82  ?Wt. (Lbs) 172.2  172  ?BMI 29.56 kg/m2  30.47 kg/m2  ? ? ? ? ?Physical Exam ?Constitutional:   ?   Appearance: She is well-developed.  ?Cardiovascular:  ?   Rate and Rhythm: Normal rate.  ?   Heart sounds: Normal heart sounds. No murmur heard. ?Pulmonary:  ?   Effort: Pulmonary effort is normal.  ?   Breath sounds: Normal breath sounds. No wheezing or rales.  ?Chest:  ?   Chest wall: No tenderness.  ?Abdominal:  ?   General: Bowel sounds are normal. There is no distension.  ?   Palpations: Abdomen is soft. There is no mass.  ?   Tenderness: There is no abdominal tenderness.  ?Musculoskeletal:     ?   General: Normal range of motion.  ?   Right lower leg: No edema.  ?   Left lower leg: No edema.  ?   Comments: Slow gait and she requires assistance in transferring from the chair to the table  ?Neurological:  ?   Mental Status: She is alert and oriented to person, place, and time.  ? ? ?Diabetic Foot Exam - Simple   ?Simple Foot Form ?Visual Inspection ?No deformities, no ulcerations, no other skin breakdown bilaterally: Yes ?Sensation  Testing ?Intact to touch and monofilament testing bilaterally: Yes ?Pulse Check ?Posterior Tibialis and Dorsalis pulse intact bilaterally: Yes ?Comments ?  ? ? ? ?  Latest Ref Rng & Units 04/25/2021  ? 11:09 AM 09/12/2020  ?  2:28 PM 04/10/2020  ? 10:33 AM  ?CMP  ?Glucose 65 - 99 mg/dL 220   83   105    ?BUN 8 - 27 mg/dL '19   21   16    '$ ?Creatinine 0.57 - 1.00 mg/dL 0.98   0.98   0.90    ?  Sodium 134 - 144 mmol/L 142   143   141    ?Potassium 3.5 - 5.2 mmol/L 4.5   4.4   4.3    ?Chloride 96 - 106 mmol/L 100   101   102    ?CO2 20 - 29 mmol/L '28   27   25    '$ ?Calcium 8.7 - 10.3 mg/dL 9.0   9.1   9.3    ?Total Protein 6.0 - 8.5 g/dL 6.9   7.2     ?Total Bilirubin 0.0 - 1.2 mg/dL 0.6   0.5     ?Alkaline Phos 44 - 121 IU/L 110   113     ?AST 0 - 40 IU/L 14   15     ?ALT 0 - 32 IU/L 11   15     ? ? ?Lipid Panel  ?   ?Component Value Date/Time  ? CHOL 140 09/03/2018 1127  ? TRIG 122 09/03/2018 1127  ? HDL 56 09/03/2018 1127  ? CHOLHDL 2.5 09/03/2018 1127  ? CHOLHDL 3.0 10/03/2016 0859  ? VLDL 26 10/03/2016 0859  ? Sac 60 09/03/2018 1127  ? ? ?CBC ?   ?Component Value Date/Time  ? WBC 6.0 04/25/2021 1109  ? WBC 8.9 09/17/2019 0154  ? RBC 4.18 04/25/2021 1109  ? RBC 3.96 09/17/2019 0154  ? HGB 12.4 04/25/2021 1109  ? HCT 38.5 04/25/2021 1109  ? PLT 183 04/25/2021 1109  ? MCV 92 04/25/2021 1109  ? MCH 29.7 04/25/2021 1109  ? MCH 31.1 09/17/2019 0154  ? MCHC 32.2 04/25/2021 1109  ? MCHC 35.4 09/17/2019 0154  ? RDW 13.1 04/25/2021 1109  ? LYMPHSABS 1.7 04/25/2021 1109  ? MONOABS 0.6 09/16/2019 1913  ? EOSABS 0.1 04/25/2021 1109  ? BASOSABS 0.1 04/25/2021 1109  ? ? ? ?Lab Results  ?Component Value Date  ? HGBA1C 6.6 03/26/2022  ? ? ?Assessment & Plan:  ?1. Type 2 diabetes mellitus with other specified complication, without long-term current use of insulin (HCC) ?Controlled with A1c of 6.6 ?Counseled on Diabetic diet, my plate method, 161 minutes of moderate intensity exercise/week ?Blood sugar logs with fasting goals of 80-120  mg/dl, random of less than 180 and in the event of sugars less than 60 mg/dl or greater than 400 mg/dl encouraged to notify the clinic. ?Advised on the need for annual eye exams, annual foot exams, Pneumonia vacci

## 2022-03-27 LAB — MICROALBUMIN / CREATININE URINE RATIO
Creatinine, Urine: 167.9 mg/dL
Microalb/Creat Ratio: 16 mg/g creat (ref 0–29)
Microalbumin, Urine: 26.4 ug/mL

## 2022-03-27 LAB — CMP14+EGFR
ALT: 13 IU/L (ref 0–32)
AST: 18 IU/L (ref 0–40)
Albumin/Globulin Ratio: 1.8 (ref 1.2–2.2)
Albumin: 4.2 g/dL (ref 3.6–4.6)
Alkaline Phosphatase: 117 IU/L (ref 44–121)
BUN/Creatinine Ratio: 24 (ref 12–28)
BUN: 24 mg/dL (ref 8–27)
Bilirubin Total: 0.6 mg/dL (ref 0.0–1.2)
CO2: 28 mmol/L (ref 20–29)
Calcium: 9 mg/dL (ref 8.7–10.3)
Chloride: 100 mmol/L (ref 96–106)
Creatinine, Ser: 0.98 mg/dL (ref 0.57–1.00)
Globulin, Total: 2.4 g/dL (ref 1.5–4.5)
Glucose: 142 mg/dL — ABNORMAL HIGH (ref 70–99)
Potassium: 4.2 mmol/L (ref 3.5–5.2)
Sodium: 142 mmol/L (ref 134–144)
Total Protein: 6.6 g/dL (ref 6.0–8.5)
eGFR: 57 mL/min/{1.73_m2} — ABNORMAL LOW (ref >59)

## 2022-04-02 NOTE — Telephone Encounter (Signed)
I spoke to Freddie Breech, RN/ [place] and she said that they have been trying to reach patient's family to schedule an assessment but they have not been able to speak to anyone. I provided her with daughter, [name]'s phone number and also encouraged her to try patient's son, [name] again.  She said she would share the information with the scheduler ?

## 2022-04-05 ENCOUNTER — Other Ambulatory Visit: Payer: Self-pay | Admitting: Family Medicine

## 2022-04-05 DIAGNOSIS — I10 Essential (primary) hypertension: Secondary | ICD-10-CM

## 2022-04-05 NOTE — Telephone Encounter (Signed)
rx was sent to same pharmacy with 90 day supply on 03/26/22 #180/1 ?Requested Prescriptions  ?Pending Prescriptions Disp Refills  ?? carvedilol (COREG) 25 MG tablet [Pharmacy Med Name: CARVEDILOL '25MG'$  TABLETS] 180 tablet 1  ?  Sig: TAKE 1 TABLET(25 MG) BY MOUTH TWICE DAILY WITH A MEAL  ?  ? Cardiovascular: Beta Blockers 3 Passed - 04/05/2022  6:27 AM  ?  ?  Passed - Cr in normal range and within 360 days  ?  Creat  ?Date Value Ref Range Status  ?10/03/2016 0.93 0.60 - 0.93 mg/dL Final  ?  Comment:  ?    ?For patients > or = 85 years of age: The upper reference limit for ?Creatinine is approximately 13% higher for people identified as ?African-American. ?  ?  ? ?Creatinine, Ser  ?Date Value Ref Range Status  ?03/26/2022 0.98 0.57 - 1.00 mg/dL Final  ? ?Creatinine, Urine  ?Date Value Ref Range Status  ?10/03/2016 198 20 - 320 mg/dL Final  ?   ?  ?  Passed - AST in normal range and within 360 days  ?  AST  ?Date Value Ref Range Status  ?03/26/2022 18 0 - 40 IU/L Final  ?   ?  ?  Passed - ALT in normal range and within 360 days  ?  ALT  ?Date Value Ref Range Status  ?03/26/2022 13 0 - 32 IU/L Final  ?   ?  ?  Passed - Last BP in normal range  ?  BP Readings from Last 1 Encounters:  ?03/26/22 130/69  ?   ?  ?  Passed - Last Heart Rate in normal range  ?  Pulse Readings from Last 1 Encounters:  ?03/26/22 62  ?   ?  ?  Passed - Valid encounter within last 6 months  ?  Recent Outpatient Visits   ?      ? 1 week ago Type 2 diabetes mellitus with other specified complication, without long-term current use of insulin ([place])  ? [place], Charlane Ferretti, MD  ? 11 months ago Type 2 diabetes mellitus with other specified complication, without long-term current use of insulin ([place])  ? [place], Charlane Ferretti, MD  ? 1 year ago Dementia with behavioral disturbance, unspecified dementia type [facility])  ? [place], Charlane Ferretti, MD  ? 1 year  ago Type 2 diabetes mellitus with other specified complication, without long-term current use of insulin ([place])  ? [place], Charlane Ferretti, MD  ? 1 year ago Type 2 diabetes mellitus with other specified complication, without long-term current use of insulin ([place])  ? [place] Charlott Rakes, MD  ?  ?  ? ?  ?  ?  ? ? ?

## 2022-04-09 ENCOUNTER — Telehealth: Payer: Self-pay

## 2022-04-09 NOTE — Telephone Encounter (Signed)
This message was received from Freddie Breech, RN/ [facility]: ? ?We have tried to schedule this pt numerous times and they just told us this weekend that she is out of town and will not be back "for awhile."  ? ? ?

## 2022-06-12 ENCOUNTER — Encounter (HOSPITAL_BASED_OUTPATIENT_CLINIC_OR_DEPARTMENT_OTHER): Payer: Medicare Other | Admitting: Family Medicine

## 2022-06-12 ENCOUNTER — Ambulatory Visit: Payer: Self-pay | Admitting: *Deleted

## 2022-06-12 DIAGNOSIS — U071 COVID-19: Secondary | ICD-10-CM

## 2022-06-12 MED ORDER — NIRMATRELVIR/RITONAVIR (PAXLOVID)TABLET: 3.0000 | ORAL_TABLET | Freq: Two times a day (BID) | ORAL | 0 refills | Status: DC

## 2022-06-12 MED ORDER — BENZONATATE 100 MG PO CAPS: 100.0000 mg | ORAL_CAPSULE | Freq: Two times a day (BID) | ORAL | 0 refills | Status: AC | PRN

## 2022-06-12 MED ORDER — NIRMATRELVIR/RITONAVIR (PAXLOVID) TABLET (RENAL DOSING): 2.0000 | ORAL_TABLET | Freq: Two times a day (BID) | ORAL | 0 refills | Status: AC

## 2022-06-12 NOTE — Telephone Encounter (Signed)
    Chief Complaint: Pt.'s daughter, Watt Climes, reports pt. Tested positive for COVID yesterday. Has sore throat, runny nose. Currently not with pt. Will talk to pt. And call back. Symptoms: Above Frequency: Yesterday Pertinent Negatives: Patient denies fever, SOB Disposition: '[]'$ ED /'[]'$ Urgent Care (no appt availability in office) / '[]'$ Appointment(In office/virtual)/ '[]'$  [facility]/ '[]'$ Home Care/ '[]'$ Refused Recommended Disposition /'[]'$ [place] [facility]/ '[x]'$  Follow-up with PCP Additional Notes: Discussed [facility] or [place]. States she will talk with pt. And see what she would like to do.  Answer Assessment - Initial Assessment Questions 1. COVID-19 DIAGNOSIS: "Who made your COVID-19 diagnosis?" "Was it confirmed by a positive lab test or self-test?" If not diagnosed by a doctor (or NP/PA), ask "Are there lots of cases (community spread) where you live?" Note: See public health department website, if unsure.     Home test 2. COVID-19 EXPOSURE: "Was there any known exposure to COVID before the symptoms began?" CDC Definition of close contact: within 6 feet (2 meters) for a total of 15 minutes or more over a 24-hour period.      No 3. ONSET: "When did the COVID-19 symptoms start?"      Yesterday 4. WORST SYMPTOM: "What is your worst symptom?" (e.g., cough, fever, shortness of breath, muscle aches)     Sore throat, runny nose 5. COUGH: "Do you have a cough?" If Yes, ask: "How bad is the cough?"       Mild 6. FEVER: "Do you have a fever?" If Yes, ask: "What is your temperature, how was it measured, and when did it start?"     No 7. RESPIRATORY STATUS: "Describe your breathing?" (e.g., shortness of breath, wheezing, unable to speak)      No 8. BETTER-SAME-WORSE: "Are you getting better, staying the same or getting worse compared to yesterday?"  If getting worse, ask, "In what way?"     Same 9. HIGH RISK DISEASE: "Do you have any chronic medical problems?" (e.g.,  asthma, heart or lung disease, weak immune system, obesity, etc.)     Yes 10. VACCINE: "Have you had the COVID-19 vaccine?" If Yes, ask: "Which one, how many shots, when did you get it?"       N/a 11. BOOSTER: "Have you received your COVID-19 booster?" If Yes, ask: "Which one and when did you get it?"       N/a 12. PREGNANCY: "Is there any chance you are pregnant?" "When was your last menstrual period?"       No 13. OTHER SYMPTOMS: "Do you have any other symptoms?"  (e.g., chills, fatigue, headache, loss of smell or taste, muscle pain, sore throat)       Sore throat 14. O2 SATURATION MONITOR:  "Do you use an oxygen saturation monitor (pulse oximeter) at home?" If Yes, ask "What is your reading (oxygen level) today?" "What is your usual oxygen saturation reading?" (e.g., 95%)       No  Protocols used: Coronavirus (COVID-19) Diagnosed or Suspected-A-AH

## 2022-06-12 NOTE — Telephone Encounter (Signed)
Message from Sharene Skeans sent at 06/12/2022  8:19 AM EDT  Summary: covid   Pt tested positive for covid yesterday / has runny nose ans scratchy throat / pts daughter called to see what she needs to do or if she needs medication/ please advise           Call History   Type Contact Phone/Fax User  06/12/2022 08:18 AM EDT Phone (Incoming) Torres,[name] (Emergency Contact) [phone] Alanda Slim

## 2022-06-12 NOTE — Telephone Encounter (Signed)
Please see the MyChart message reply(ies) for my assessment and plan.    This patient gave consent for this Medical Advice Message and is aware that it may result in a bill to their insurance company, as well as the possibility of receiving a bill for a co-payment or deductible. They are an established patient, but are not seeking medical advice exclusively about a problem treated during an in person or video visit in the last seven days. I did not recommend an in person or video visit within seven days of my reply.    I spent a total of 7 minutes cumulative time within 7 days through MyChart messaging.  Carrianne Hyun, MD   

## 2022-06-12 NOTE — Telephone Encounter (Signed)
I returned call to daughter Waylan Boga using [employer] for [place].   Shelbie Ammons #[id number].  Voicemail left to return call to discuss Covid positive with a nurse.

## 2022-06-25 ENCOUNTER — Other Ambulatory Visit: Payer: Self-pay | Admitting: Family Medicine

## 2022-06-25 MED ORDER — NIRMATRELVIR/RITONAVIR (PAXLOVID) TABLET (RENAL DOSING): 2.0000 | ORAL_TABLET | Freq: Two times a day (BID) | ORAL | 0 refills | Status: AC

## 2022-09-02 ENCOUNTER — Encounter (HOSPITAL_BASED_OUTPATIENT_CLINIC_OR_DEPARTMENT_OTHER): Payer: Medicare Other | Admitting: Family Medicine

## 2022-09-02 DIAGNOSIS — G479 Sleep disorder, unspecified: Secondary | ICD-10-CM

## 2022-09-02 MED ORDER — TRAZODONE HCL 50 MG PO TABS: 50.0000 mg | ORAL_TABLET | Freq: Every day | ORAL | 1 refills | Status: AC

## 2022-09-02 NOTE — Telephone Encounter (Signed)
Please see the MyChart message reply(ies) for my assessment and plan.    This patient gave consent for this Medical Advice Message and is aware that it may result in a bill to their insurance company, as well as the possibility of receiving a bill for a co-payment or deductible. They are an established patient, but are not seeking medical advice exclusively about a problem treated during an in person or video visit in the last seven days. I did not recommend an in person or video visit within seven days of my reply.    I spent a total of 7 minutes cumulative time within 7 days through MyChart messaging.  Libbi Towner, MD   

## 2022-09-05 ENCOUNTER — Encounter: Payer: Self-pay | Admitting: Family Medicine

## 2022-09-23 ENCOUNTER — Telehealth: Payer: Self-pay

## 2022-09-23 NOTE — Telephone Encounter (Signed)
Documentation with confirmation of diagnosis faxed to [facility] - [facility] referrals

## 2022-09-24 ENCOUNTER — Encounter: Payer: Self-pay | Admitting: Family Medicine

## 2022-09-27 ENCOUNTER — Ambulatory Visit: Payer: Medicare Other | Attending: Family Medicine

## 2022-09-27 DIAGNOSIS — Z111 Encounter for screening for respiratory tuberculosis: Secondary | ICD-10-CM

## 2022-10-01 ENCOUNTER — Other Ambulatory Visit: Payer: Self-pay

## 2022-10-01 ENCOUNTER — Telehealth: Payer: Self-pay | Admitting: *Deleted

## 2022-10-01 DIAGNOSIS — Z111 Encounter for screening for respiratory tuberculosis: Secondary | ICD-10-CM

## 2022-10-01 NOTE — Telephone Encounter (Signed)
Call placed to lab at [facility] and lab tech will call [facility] and find out what is wrong with the specimen.

## 2022-10-01 NOTE — Telephone Encounter (Signed)
'  Ronalee Belts' with [employer] calling to give results. Hung up or call dropped prior to transfer. Attempted to call back , recording "Enter ID." Unable to reach

## 2022-10-02 ENCOUNTER — Ambulatory Visit: Payer: Medicare Other | Attending: Family Medicine

## 2022-10-02 DIAGNOSIS — Z111 Encounter for screening for respiratory tuberculosis: Secondary | ICD-10-CM

## 2022-10-03 ENCOUNTER — Telehealth: Payer: Self-pay | Admitting: Family Medicine

## 2022-10-03 LAB — REFERENCE MICRO PROBLEM TEST

## 2022-10-03 LAB — QUANTIFERON-TB GOLD PLUS

## 2022-10-03 NOTE — Telephone Encounter (Signed)
For Caroline Cooper/ Caroline Cooper from [facility] has called you back being the one whose business card you had found without paperwork, you called around 3:30, and she is returning call.Marland Kitchen She wants a fu call as she states she has some things to share with you. FU at the [phone] .

## 2022-10-04 NOTE — Telephone Encounter (Signed)
Call placed to number provided and person who answered the phone states that she does not know anyone by the name [name], she then transferred me to the business line and no one picked up.

## 2022-10-05 LAB — QUANTIFERON-TB GOLD PLUS
QuantiFERON Mitogen Value: >10 IU/mL
QuantiFERON Nil Value: 0.02 IU/mL
QuantiFERON TB1 Ag Value: 0 IU/mL
QuantiFERON TB2 Ag Value: 0.01 IU/mL
QuantiFERON-TB Gold Plus: NEGATIVE

## 2022-10-09 NOTE — Progress Notes (Signed)
Pt has already had lab redrawn

## 2022-10-11 ENCOUNTER — Other Ambulatory Visit: Payer: Self-pay | Admitting: Family Medicine

## 2022-10-11 DIAGNOSIS — I10 Essential (primary) hypertension: Secondary | ICD-10-CM

## 2022-10-16 DIAGNOSIS — G4700 Insomnia, unspecified: Secondary | ICD-10-CM | POA: Diagnosis not present

## 2022-10-16 DIAGNOSIS — F0390 Unspecified dementia without behavioral disturbance: Secondary | ICD-10-CM | POA: Diagnosis not present

## 2022-10-16 DIAGNOSIS — I10 Essential (primary) hypertension: Secondary | ICD-10-CM | POA: Diagnosis not present

## 2022-10-23 DIAGNOSIS — F419 Anxiety disorder, unspecified: Secondary | ICD-10-CM | POA: Diagnosis not present

## 2022-10-23 DIAGNOSIS — G4700 Insomnia, unspecified: Secondary | ICD-10-CM | POA: Diagnosis not present

## 2022-10-23 DIAGNOSIS — F02A4 Dementia in other diseases classified elsewhere, mild, with anxiety: Secondary | ICD-10-CM | POA: Diagnosis not present

## 2022-10-23 DIAGNOSIS — R260 Ataxic gait: Secondary | ICD-10-CM | POA: Diagnosis not present

## 2022-10-23 DIAGNOSIS — I10 Essential (primary) hypertension: Secondary | ICD-10-CM | POA: Diagnosis not present

## 2022-10-29 DIAGNOSIS — M546 Pain in thoracic spine: Secondary | ICD-10-CM | POA: Diagnosis not present

## 2022-10-29 DIAGNOSIS — M79651 Pain in right thigh: Secondary | ICD-10-CM | POA: Diagnosis not present

## 2022-10-29 DIAGNOSIS — M79661 Pain in right lower leg: Secondary | ICD-10-CM | POA: Diagnosis not present

## 2022-10-29 DIAGNOSIS — M549 Dorsalgia, unspecified: Secondary | ICD-10-CM | POA: Diagnosis not present

## 2022-10-29 DIAGNOSIS — M25561 Pain in right knee: Secondary | ICD-10-CM | POA: Diagnosis not present

## 2022-10-30 DIAGNOSIS — F03A4 Unspecified dementia, mild, with anxiety: Secondary | ICD-10-CM | POA: Diagnosis not present

## 2022-10-30 DIAGNOSIS — I10 Essential (primary) hypertension: Secondary | ICD-10-CM | POA: Diagnosis not present

## 2022-10-30 DIAGNOSIS — Z556 Problems related to health literacy: Secondary | ICD-10-CM | POA: Diagnosis not present

## 2022-10-30 DIAGNOSIS — Z9181 History of falling: Secondary | ICD-10-CM | POA: Diagnosis not present

## 2022-10-30 DIAGNOSIS — F419 Anxiety disorder, unspecified: Secondary | ICD-10-CM | POA: Diagnosis not present

## 2022-10-30 DIAGNOSIS — W1830XA Fall on same level, unspecified, initial encounter: Secondary | ICD-10-CM | POA: Diagnosis not present

## 2022-10-30 DIAGNOSIS — R260 Ataxic gait: Secondary | ICD-10-CM | POA: Diagnosis not present

## 2022-10-31 DIAGNOSIS — Z9181 History of falling: Secondary | ICD-10-CM | POA: Diagnosis not present

## 2022-10-31 DIAGNOSIS — Z556 Problems related to health literacy: Secondary | ICD-10-CM | POA: Diagnosis not present

## 2022-10-31 DIAGNOSIS — F03A4 Unspecified dementia, mild, with anxiety: Secondary | ICD-10-CM | POA: Diagnosis not present

## 2022-10-31 DIAGNOSIS — R260 Ataxic gait: Secondary | ICD-10-CM | POA: Diagnosis not present

## 2022-10-31 DIAGNOSIS — I10 Essential (primary) hypertension: Secondary | ICD-10-CM | POA: Diagnosis not present

## 2022-11-01 DIAGNOSIS — E785 Hyperlipidemia, unspecified: Secondary | ICD-10-CM | POA: Diagnosis not present

## 2022-11-01 DIAGNOSIS — D509 Iron deficiency anemia, unspecified: Secondary | ICD-10-CM | POA: Diagnosis not present

## 2022-11-01 DIAGNOSIS — D649 Anemia, unspecified: Secondary | ICD-10-CM | POA: Diagnosis not present

## 2022-11-01 DIAGNOSIS — I10 Essential (primary) hypertension: Secondary | ICD-10-CM | POA: Diagnosis not present

## 2022-11-01 DIAGNOSIS — D519 Vitamin B12 deficiency anemia, unspecified: Secondary | ICD-10-CM | POA: Diagnosis not present

## 2022-11-01 DIAGNOSIS — E559 Vitamin D deficiency, unspecified: Secondary | ICD-10-CM | POA: Diagnosis not present

## 2022-11-01 DIAGNOSIS — E039 Hypothyroidism, unspecified: Secondary | ICD-10-CM | POA: Diagnosis not present

## 2022-11-01 DIAGNOSIS — E519 Thiamine deficiency, unspecified: Secondary | ICD-10-CM | POA: Diagnosis not present

## 2022-11-01 DIAGNOSIS — R7309 Other abnormal glucose: Secondary | ICD-10-CM | POA: Diagnosis not present

## 2022-11-04 DIAGNOSIS — Z9181 History of falling: Secondary | ICD-10-CM | POA: Diagnosis not present

## 2022-11-04 DIAGNOSIS — I10 Essential (primary) hypertension: Secondary | ICD-10-CM | POA: Diagnosis not present

## 2022-11-04 DIAGNOSIS — F03A4 Unspecified dementia, mild, with anxiety: Secondary | ICD-10-CM | POA: Diagnosis not present

## 2022-11-04 DIAGNOSIS — Z556 Problems related to health literacy: Secondary | ICD-10-CM | POA: Diagnosis not present

## 2022-11-04 DIAGNOSIS — R260 Ataxic gait: Secondary | ICD-10-CM | POA: Diagnosis not present

## 2022-11-06 DIAGNOSIS — I10 Essential (primary) hypertension: Secondary | ICD-10-CM | POA: Diagnosis not present

## 2022-11-06 DIAGNOSIS — Z9181 History of falling: Secondary | ICD-10-CM | POA: Diagnosis not present

## 2022-11-06 DIAGNOSIS — R260 Ataxic gait: Secondary | ICD-10-CM | POA: Diagnosis not present

## 2022-11-06 DIAGNOSIS — F03A4 Unspecified dementia, mild, with anxiety: Secondary | ICD-10-CM | POA: Diagnosis not present

## 2022-11-06 DIAGNOSIS — Z556 Problems related to health literacy: Secondary | ICD-10-CM | POA: Diagnosis not present

## 2022-11-08 DIAGNOSIS — R260 Ataxic gait: Secondary | ICD-10-CM | POA: Diagnosis not present

## 2022-11-08 DIAGNOSIS — Z9181 History of falling: Secondary | ICD-10-CM | POA: Diagnosis not present

## 2022-11-08 DIAGNOSIS — I10 Essential (primary) hypertension: Secondary | ICD-10-CM | POA: Diagnosis not present

## 2022-11-08 DIAGNOSIS — F03A4 Unspecified dementia, mild, with anxiety: Secondary | ICD-10-CM | POA: Diagnosis not present

## 2022-11-08 DIAGNOSIS — Z556 Problems related to health literacy: Secondary | ICD-10-CM | POA: Diagnosis not present

## 2022-11-11 DIAGNOSIS — M79672 Pain in left foot: Secondary | ICD-10-CM | POA: Diagnosis not present

## 2022-11-11 DIAGNOSIS — R260 Ataxic gait: Secondary | ICD-10-CM | POA: Diagnosis not present

## 2022-11-11 DIAGNOSIS — I10 Essential (primary) hypertension: Secondary | ICD-10-CM | POA: Diagnosis not present

## 2022-11-11 DIAGNOSIS — L603 Nail dystrophy: Secondary | ICD-10-CM | POA: Diagnosis not present

## 2022-11-11 DIAGNOSIS — M2042 Other hammer toe(s) (acquired), left foot: Secondary | ICD-10-CM | POA: Diagnosis not present

## 2022-11-11 DIAGNOSIS — M21612 Bunion of left foot: Secondary | ICD-10-CM | POA: Diagnosis not present

## 2022-11-11 DIAGNOSIS — M79671 Pain in right foot: Secondary | ICD-10-CM | POA: Diagnosis not present

## 2022-11-11 DIAGNOSIS — F03A4 Unspecified dementia, mild, with anxiety: Secondary | ICD-10-CM | POA: Diagnosis not present

## 2022-11-11 DIAGNOSIS — Z9181 History of falling: Secondary | ICD-10-CM | POA: Diagnosis not present

## 2022-11-11 DIAGNOSIS — M21611 Bunion of right foot: Secondary | ICD-10-CM | POA: Diagnosis not present

## 2022-11-11 DIAGNOSIS — L601 Onycholysis: Secondary | ICD-10-CM | POA: Diagnosis not present

## 2022-11-11 DIAGNOSIS — M2041 Other hammer toe(s) (acquired), right foot: Secondary | ICD-10-CM | POA: Diagnosis not present

## 2022-11-11 DIAGNOSIS — I739 Peripheral vascular disease, unspecified: Secondary | ICD-10-CM | POA: Diagnosis not present

## 2022-11-11 DIAGNOSIS — B351 Tinea unguium: Secondary | ICD-10-CM | POA: Diagnosis not present

## 2022-11-11 DIAGNOSIS — Z556 Problems related to health literacy: Secondary | ICD-10-CM | POA: Diagnosis not present

## 2022-11-13 DIAGNOSIS — F32A Depression, unspecified: Secondary | ICD-10-CM | POA: Diagnosis not present

## 2022-11-13 DIAGNOSIS — Z556 Problems related to health literacy: Secondary | ICD-10-CM | POA: Diagnosis not present

## 2022-11-13 DIAGNOSIS — Z9181 History of falling: Secondary | ICD-10-CM | POA: Diagnosis not present

## 2022-11-13 DIAGNOSIS — F03A4 Unspecified dementia, mild, with anxiety: Secondary | ICD-10-CM | POA: Diagnosis not present

## 2022-11-13 DIAGNOSIS — R630 Anorexia: Secondary | ICD-10-CM | POA: Diagnosis not present

## 2022-11-13 DIAGNOSIS — E119 Type 2 diabetes mellitus without complications: Secondary | ICD-10-CM | POA: Diagnosis not present

## 2022-11-13 DIAGNOSIS — E559 Vitamin D deficiency, unspecified: Secondary | ICD-10-CM | POA: Diagnosis not present

## 2022-11-13 DIAGNOSIS — I10 Essential (primary) hypertension: Secondary | ICD-10-CM | POA: Diagnosis not present

## 2022-11-13 DIAGNOSIS — R260 Ataxic gait: Secondary | ICD-10-CM | POA: Diagnosis not present

## 2022-11-14 DIAGNOSIS — I10 Essential (primary) hypertension: Secondary | ICD-10-CM | POA: Diagnosis not present

## 2022-11-14 DIAGNOSIS — Z9181 History of falling: Secondary | ICD-10-CM | POA: Diagnosis not present

## 2022-11-14 DIAGNOSIS — Z556 Problems related to health literacy: Secondary | ICD-10-CM | POA: Diagnosis not present

## 2022-11-14 DIAGNOSIS — R260 Ataxic gait: Secondary | ICD-10-CM | POA: Diagnosis not present

## 2022-11-14 DIAGNOSIS — F03A4 Unspecified dementia, mild, with anxiety: Secondary | ICD-10-CM | POA: Diagnosis not present

## 2022-11-18 DIAGNOSIS — I10 Essential (primary) hypertension: Secondary | ICD-10-CM | POA: Diagnosis not present

## 2022-11-18 DIAGNOSIS — F03A4 Unspecified dementia, mild, with anxiety: Secondary | ICD-10-CM | POA: Diagnosis not present

## 2022-11-18 DIAGNOSIS — R260 Ataxic gait: Secondary | ICD-10-CM | POA: Diagnosis not present

## 2022-11-18 DIAGNOSIS — Z556 Problems related to health literacy: Secondary | ICD-10-CM | POA: Diagnosis not present

## 2022-11-18 DIAGNOSIS — Z9181 History of falling: Secondary | ICD-10-CM | POA: Diagnosis not present

## 2022-11-20 DIAGNOSIS — R260 Ataxic gait: Secondary | ICD-10-CM | POA: Diagnosis not present

## 2022-11-20 DIAGNOSIS — Z556 Problems related to health literacy: Secondary | ICD-10-CM | POA: Diagnosis not present

## 2022-11-20 DIAGNOSIS — I10 Essential (primary) hypertension: Secondary | ICD-10-CM | POA: Diagnosis not present

## 2022-11-20 DIAGNOSIS — F03A4 Unspecified dementia, mild, with anxiety: Secondary | ICD-10-CM | POA: Diagnosis not present

## 2022-11-20 DIAGNOSIS — Z9181 History of falling: Secondary | ICD-10-CM | POA: Diagnosis not present

## 2022-11-21 DIAGNOSIS — Z556 Problems related to health literacy: Secondary | ICD-10-CM | POA: Diagnosis not present

## 2022-11-21 DIAGNOSIS — Z9181 History of falling: Secondary | ICD-10-CM | POA: Diagnosis not present

## 2022-11-21 DIAGNOSIS — R609 Edema, unspecified: Secondary | ICD-10-CM | POA: Diagnosis not present

## 2022-11-21 DIAGNOSIS — R260 Ataxic gait: Secondary | ICD-10-CM | POA: Diagnosis not present

## 2022-11-21 DIAGNOSIS — F03A4 Unspecified dementia, mild, with anxiety: Secondary | ICD-10-CM | POA: Diagnosis not present

## 2022-11-21 DIAGNOSIS — I10 Essential (primary) hypertension: Secondary | ICD-10-CM | POA: Diagnosis not present

## 2022-11-27 DIAGNOSIS — R260 Ataxic gait: Secondary | ICD-10-CM | POA: Diagnosis not present

## 2022-11-27 DIAGNOSIS — I10 Essential (primary) hypertension: Secondary | ICD-10-CM | POA: Diagnosis not present

## 2022-11-27 DIAGNOSIS — Z556 Problems related to health literacy: Secondary | ICD-10-CM | POA: Diagnosis not present

## 2022-11-27 DIAGNOSIS — F03A4 Unspecified dementia, mild, with anxiety: Secondary | ICD-10-CM | POA: Diagnosis not present

## 2022-11-27 DIAGNOSIS — Z9181 History of falling: Secondary | ICD-10-CM | POA: Diagnosis not present

## 2022-11-27 DIAGNOSIS — G4700 Insomnia, unspecified: Secondary | ICD-10-CM | POA: Diagnosis not present

## 2022-12-11 DIAGNOSIS — E559 Vitamin D deficiency, unspecified: Secondary | ICD-10-CM | POA: Diagnosis not present

## 2022-12-11 DIAGNOSIS — I10 Essential (primary) hypertension: Secondary | ICD-10-CM | POA: Diagnosis not present

## 2022-12-11 DIAGNOSIS — F32A Depression, unspecified: Secondary | ICD-10-CM | POA: Diagnosis not present

## 2022-12-16 DIAGNOSIS — E785 Hyperlipidemia, unspecified: Secondary | ICD-10-CM | POA: Diagnosis not present

## 2022-12-16 DIAGNOSIS — I10 Essential (primary) hypertension: Secondary | ICD-10-CM | POA: Diagnosis not present

## 2022-12-18 DIAGNOSIS — I951 Orthostatic hypotension: Secondary | ICD-10-CM | POA: Diagnosis not present

## 2022-12-18 DIAGNOSIS — F32A Depression, unspecified: Secondary | ICD-10-CM | POA: Diagnosis not present

## 2022-12-18 DIAGNOSIS — G4700 Insomnia, unspecified: Secondary | ICD-10-CM | POA: Diagnosis not present

## 2023-06-02 DEATH — deceased
